# Patient Record
Sex: Male | Born: 1955
Health system: Southern US, Community
[De-identification: ages and names within clinical notes are randomized; demographics above are authoritative.]

## PROBLEM LIST (undated history)

## (undated) DIAGNOSIS — F988 Other specified behavioral and emotional disorders with onset usually occurring in childhood and adolescence: Secondary | ICD-10-CM

## (undated) DIAGNOSIS — H409 Unspecified glaucoma: Secondary | ICD-10-CM

## (undated) DIAGNOSIS — IMO0002 Reserved for concepts with insufficient information to code with codable children: Secondary | ICD-10-CM

## (undated) DIAGNOSIS — U071 COVID-19: Secondary | ICD-10-CM

## (undated) DIAGNOSIS — H33019 Retinal detachment with single break, unspecified eye: Secondary | ICD-10-CM

## (undated) DIAGNOSIS — L821 Other seborrheic keratosis: Secondary | ICD-10-CM

## (undated) DIAGNOSIS — M1990 Unspecified osteoarthritis, unspecified site: Secondary | ICD-10-CM

## (undated) DIAGNOSIS — R51 Headache: Secondary | ICD-10-CM

## (undated) DIAGNOSIS — G4700 Insomnia, unspecified: Secondary | ICD-10-CM

## (undated) DIAGNOSIS — F332 Major depressive disorder, recurrent severe without psychotic features: Secondary | ICD-10-CM

## (undated) DIAGNOSIS — Z0000 Encounter for general adult medical examination without abnormal findings: Secondary | ICD-10-CM

## (undated) DIAGNOSIS — I10 Essential (primary) hypertension: Secondary | ICD-10-CM

## (undated) DIAGNOSIS — Z125 Encounter for screening for malignant neoplasm of prostate: Secondary | ICD-10-CM

## (undated) DIAGNOSIS — F419 Anxiety disorder, unspecified: Secondary | ICD-10-CM

## (undated) DIAGNOSIS — M25559 Pain in unspecified hip: Secondary | ICD-10-CM

## (undated) DIAGNOSIS — L570 Actinic keratosis: Secondary | ICD-10-CM

## (undated) DIAGNOSIS — Z79899 Other long term (current) drug therapy: Secondary | ICD-10-CM

## (undated) DIAGNOSIS — K4090 Unilateral inguinal hernia, without obstruction or gangrene, not specified as recurrent: Secondary | ICD-10-CM

## (undated) DIAGNOSIS — E785 Hyperlipidemia, unspecified: Secondary | ICD-10-CM

## (undated) DIAGNOSIS — K589 Irritable bowel syndrome without diarrhea: Secondary | ICD-10-CM

## (undated) DIAGNOSIS — I4891 Unspecified atrial fibrillation: Secondary | ICD-10-CM

## (undated) DIAGNOSIS — E7800 Pure hypercholesterolemia, unspecified: Secondary | ICD-10-CM

## (undated) DIAGNOSIS — R609 Edema, unspecified: Secondary | ICD-10-CM

## (undated) DIAGNOSIS — G44209 Tension-type headache, unspecified, not intractable: Secondary | ICD-10-CM

## (undated) DIAGNOSIS — K648 Other hemorrhoids: Secondary | ICD-10-CM

## (undated) DIAGNOSIS — E669 Obesity, unspecified: Secondary | ICD-10-CM

## (undated) DIAGNOSIS — L2089 Other atopic dermatitis: Secondary | ICD-10-CM

## (undated) DIAGNOSIS — H4389 Other disorders of vitreous body: Secondary | ICD-10-CM

## (undated) DIAGNOSIS — L309 Dermatitis, unspecified: Secondary | ICD-10-CM

## (undated) DIAGNOSIS — R1032 Left lower quadrant pain: Secondary | ICD-10-CM

## (undated) DIAGNOSIS — H538 Other visual disturbances: Secondary | ICD-10-CM

## (undated) DIAGNOSIS — F1010 Alcohol abuse, uncomplicated: Secondary | ICD-10-CM

## (undated) DIAGNOSIS — M545 Low back pain, unspecified: Secondary | ICD-10-CM

## (undated) DIAGNOSIS — M8448XA Pathological fracture, other site, initial encounter for fracture: Secondary | ICD-10-CM

## (undated) DIAGNOSIS — F3289 Other specified depressive episodes: Secondary | ICD-10-CM

## (undated) DIAGNOSIS — F411 Generalized anxiety disorder: Secondary | ICD-10-CM

## (undated) DIAGNOSIS — F329 Major depressive disorder, single episode, unspecified: Secondary | ICD-10-CM

## (undated) DIAGNOSIS — M5430 Sciatica, unspecified side: Secondary | ICD-10-CM

## (undated) DIAGNOSIS — E119 Type 2 diabetes mellitus without complications: Secondary | ICD-10-CM

## (undated) DIAGNOSIS — J329 Chronic sinusitis, unspecified: Secondary | ICD-10-CM

## (undated) DIAGNOSIS — M2550 Pain in unspecified joint: Secondary | ICD-10-CM

## (undated) DIAGNOSIS — M542 Cervicalgia: Secondary | ICD-10-CM

## (undated) HISTORY — DX: Obesity, unspecified: E66.9

## (undated) HISTORY — DX: Type 2 diabetes mellitus without complications: E11.9

## (undated) HISTORY — DX: Sciatica, unspecified side: M54.30

## (undated) HISTORY — DX: Cervicalgia: M54.2

## (undated) HISTORY — DX: Major depressive disorder, recurrent severe without psychotic features: F33.2

## (undated) HISTORY — DX: Insomnia, unspecified: G47.00

## (undated) HISTORY — DX: Other long term (current) drug therapy: Z79.899

## (undated) HISTORY — DX: Unspecified glaucoma: H40.9

## (undated) HISTORY — DX: Other specified depressive episodes: F32.89

## (undated) HISTORY — DX: Actinic keratosis: L57.0

## (undated) HISTORY — DX: Irritable bowel syndrome, unspecified: K58.9

## (undated) HISTORY — DX: Generalized anxiety disorder: F41.1

## (undated) HISTORY — DX: Low back pain: M54.5

## (undated) HISTORY — DX: Hyperlipidemia, unspecified: E78.5

## (undated) HISTORY — DX: Pathological fracture, other site, initial encounter for fracture: M84.48XA

## (undated) HISTORY — DX: Pain in unspecified hip: M25.559

## (undated) HISTORY — DX: Encounter for general adult medical examination without abnormal findings: Z00.00

## (undated) HISTORY — DX: Unspecified atrial fibrillation: I48.91

## (undated) HISTORY — DX: Unilateral inguinal hernia, without obstruction or gangrene, not specified as recurrent: K40.90

## (undated) HISTORY — DX: Alcohol abuse, uncomplicated: F10.10

## (undated) HISTORY — DX: Major depressive disorder, single episode, unspecified: F32.9

## (undated) HISTORY — DX: Essential (primary) hypertension: I10

## (undated) HISTORY — DX: Edema, unspecified: R60.9

## (undated) HISTORY — DX: Tension-type headache, unspecified, not intractable: G44.209

## (undated) HISTORY — DX: Other atopic dermatitis: L20.89

## (undated) HISTORY — DX: Other hemorrhoids: K64.8

## (undated) HISTORY — DX: Low back pain, unspecified: M54.50

## (undated) HISTORY — DX: Other seborrheic keratosis: L82.1

## (undated) HISTORY — DX: Retinal detachment with single break, unspecified eye: H33.019

## (undated) HISTORY — DX: Encounter for screening for malignant neoplasm of prostate: Z12.5

## (undated) HISTORY — DX: Left lower quadrant pain: R10.32

## (undated) HISTORY — DX: Pain in unspecified joint: M25.50

## (undated) HISTORY — DX: Other visual disturbances: H53.8

## (undated) HISTORY — DX: Other specified behavioral and emotional disorders with onset usually occurring in childhood and adolescence: F98.8

## (undated) HISTORY — DX: Other disorders of vitreous body: H43.89

## (undated) HISTORY — DX: COVID-19: U07.1

## (undated) HISTORY — DX: Reserved for concepts with insufficient information to code with codable children: IMO0002

---
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
## 1985-12-25 HISTORY — PX: APPENDECTOMY: SHX54

## 2002-01-20 ENCOUNTER — Encounter (INDEPENDENT_AMBULATORY_CARE_PROVIDER_SITE_OTHER): Payer: Self-pay | Admitting: *Deleted

## 2002-01-20 ENCOUNTER — Ambulatory Visit (HOSPITAL_COMMUNITY): Admission: RE | Admit: 2002-01-20 | Discharge: 2002-01-20 | Payer: Self-pay | Admitting: *Deleted

## 2008-07-22 ENCOUNTER — Ambulatory Visit (HOSPITAL_COMMUNITY): Admission: RE | Admit: 2008-07-22 | Discharge: 2008-07-23 | Payer: Self-pay | Admitting: Ophthalmology

## 2008-07-22 HISTORY — PX: RETINAL DETACHMENT REPAIR W/ SCLERAL BUCKLE LE: SHX2338

## 2009-07-08 ENCOUNTER — Encounter: Admission: RE | Admit: 2009-07-08 | Discharge: 2009-07-08 | Payer: Self-pay | Admitting: Internal Medicine

## 2009-07-08 ENCOUNTER — Encounter (INDEPENDENT_AMBULATORY_CARE_PROVIDER_SITE_OTHER): Payer: Self-pay | Admitting: *Deleted

## 2009-12-25 HISTORY — PX: CATARACT EXTRACTION: SUR2

## 2010-04-06 ENCOUNTER — Encounter (INDEPENDENT_AMBULATORY_CARE_PROVIDER_SITE_OTHER): Payer: Self-pay | Admitting: *Deleted

## 2010-05-24 ENCOUNTER — Ambulatory Visit: Payer: Self-pay | Admitting: Gastroenterology

## 2010-05-24 ENCOUNTER — Encounter (INDEPENDENT_AMBULATORY_CARE_PROVIDER_SITE_OTHER): Payer: Self-pay | Admitting: *Deleted

## 2010-05-24 DIAGNOSIS — K5732 Diverticulitis of large intestine without perforation or abscess without bleeding: Secondary | ICD-10-CM | POA: Insufficient documentation

## 2010-05-24 DIAGNOSIS — K589 Irritable bowel syndrome without diarrhea: Secondary | ICD-10-CM | POA: Insufficient documentation

## 2010-06-17 ENCOUNTER — Encounter (INDEPENDENT_AMBULATORY_CARE_PROVIDER_SITE_OTHER): Payer: Self-pay

## 2010-06-21 ENCOUNTER — Ambulatory Visit: Payer: Self-pay | Admitting: Gastroenterology

## 2010-06-21 ENCOUNTER — Telehealth: Payer: Self-pay | Admitting: Gastroenterology

## 2010-10-21 ENCOUNTER — Encounter: Admission: RE | Admit: 2010-10-21 | Discharge: 2010-10-21 | Payer: Self-pay | Admitting: Otolaryngology

## 2010-12-25 HISTORY — PX: NASAL SINUS SURGERY: SHX719

## 2011-01-05 HISTORY — PX: NASAL POLYP SURGERY: SHX186

## 2011-01-26 NOTE — Miscellaneous (Signed)
Summary: Lec previsit  Clinical Lists Changes  Medications: Added new medication of MOVIPREP 100 GM  SOLR (PEG-KCL-NACL-NASULF-NA ASC-C) As per prep instructions. - Signed Rx of MOVIPREP 100 GM  SOLR (PEG-KCL-NACL-NASULF-NA ASC-C) As per prep instructions.;  #1 x 0;  Signed;  Entered by: Ulis Rias RN;  Authorized by: Meryl Dare MD [facility];  Method used: Electronically to [facility] [phone] [address], [place], [place], [place]  [address], Ph: [phone] or [phone], Fax: [phone]    Prescriptions: MOVIPREP 100 GM  SOLR (PEG-KCL-NACL-NASULF-NA ASC-C) As per prep instructions.  #1 x 0   Entered by:   Ulis Rias RN   Authorized by:   Meryl Dare MD [facility]   Signed by:   Ulis Rias RN on 06/21/2010   Method used:   Electronically to        [facility] (retail)       [address]       [place], [place]  [address]       Ph: [phone] or [phone]       Fax: [phone]   RxID:   [id number]

## 2011-01-26 NOTE — Procedures (Signed)
Summary: Colonoscopyand biopsy   Colonoscopy  Procedure date:  01/20/2002  Findings:      Results: Diverticulosis.       Location:  [facility].    Procedures Next Due Date:    Colonoscopy: 01/2012 Patient Name: [name], [name]:  Procedure Procedures: Colonoscopy CPT: 95621.    with biopsy. CPT: Q5068410.  Personnel: Endoscopist: Roosvelt Harps, MD.  Referred By: Lenon Curt. Cassell Clement, MD.  Exam Location: Exam performed in Endoscopy Suite. Outpatient  Patient Consent: Procedure, Alternatives, Risks and Benefits discussed, consent obtained, from patient. Consent was obtained by the RN. Consent to be contacted was not given.  Indications Symptoms: Diarrhea Hematochezia. Abdominal pain / bloating.  History Allergies: No known allergies.  Patient Habits Patient does not smoke. Drinking Status: currently drinking. 1 drinks per day.  Pre-Exam Physical: Cardio-pulmonary exam, Rectal exam, HEENT exam , Abdominal exam, Extremity exam, Neurological exam, Mental status exam WNL.  Exam Exam: Extent of exam reached: Ileum, extent intended: Ileum.  The cecum was identified by appendiceal orifice and IC valve. Patient position: on left side. Colon retroflexion performed. Images taken. ASA Classification: I. Tolerance: excellent.  Monitoring: Pulse and BP monitoring, Oximetry used. Supplemental O2 given.  Colon Prep Used Phospho Soda for colon prep. Prep results: excellent.  Fluoroscopy: Fluoroscopy was not used.  Sedation Meds: Patient assessed and found to be appropriate for moderate (conscious) sedation. Sedation was managed by the Endoscopist. Demerol 120 given IV. Versed 12 given IV.  Findings IMAGE TAKEN: Ascending Colon.  Image #3 attached.  Comments:  Normal.  IMAGE TAKEN: Ileum.  Image #2 attached.  Comments:  Normal.  IMAGE TAKEN: Cecum.  Image #1 attached.  Comments:  Normal.  - DIVERTICULOSIS: Descending Colon to Sigmoid Colon. Not bleeding. ICD9:  Diverticulosis, Colon: 562.10. Comments: Moderate.  IMAGE TAKEN: Rectum.  Image #4 attached.  Comments:  Normal.   Assessment Abnormal examination, see findings above.  Diagnoses: 562.10: Diverticulosis, Colon.   Events  Unplanned Interventions: No intervention was required.  Unplanned Events: There were no complications. Plans  Post Exam Instructions: Post sedation instructions given.  Medication Plan: Continue current medications.  Patient Education: Patient given standard instructions for: Diverticulosis. Patient instructed to get routine colonoscopy every 10 years.  Disposition: After procedure patient sent to recovery. After recovery patient sent home.  Scheduling/Referral: Follow-Up prn. Await pathology to schedule patient.         SP Surgical Pathology - STATUS: Final             By: Gwenlyn Saran  ,        Perform Date: 27Jan03 12:00  Ordered By: Luther Parody MD , PETER          Ordered Date:  Facility: [facility]                              Department: CPATH  Service Report Text  [facility]   [address]   [place], [place] [address]   [phone]    REPORT OF SURGICAL PATHOLOGY    Case #: [id number]   Patient Name: Connor [name]   PID: [id number]   Pathologist: Berneta Levins, MD   DOB/Age Feb 23, 1956 (Age: 56) Gender: M   Date Taken: 01/20/2002   Date Received: 01/20/2002    FINAL DIAGNOSIS    ***MICROSCOPIC EXAMINATION AND DIAGNOSIS***    COLON, BIOPSY: BENIGN COLONIC MUCOSA. NO SIGNIFICANT  INFLAMMATION OR OTHER ABNORMALITIES IDENTIFIED.    COMMENT   There is colonic mucosa with normal crypt architecture and no   objective increase in inflammation. No active inflammation,   microscopic colitis, collagenous colitis or significant chronic   changes identified. No hyperplastic or adenomatous changes are   seen, and there is no evidence of malignancy.    jn   Date Reported: 01/21/2002 Berneta Levins, MD   ***  Electronically Signed Out By JAS ***    specimen(s) obtained   Colon, biopsy, random    Gross Description   Received in formalin are tan, soft tissue fragments that are   submitted in toto. Number: 4   Size: 0.2 to 0.3 cm. One cassette. (JBM:caf 01/20/02)    cf/

## 2011-01-26 NOTE — Letter (Signed)
Summary: [facility] Instructions  [facility]  [address] [place], [place] [address]   Phone: [phone]  Fax: [phone]       Connor [name]    1956/11/23    MRN: [id number]        Procedure Day /Date: Tuesday 07-05-10     Arrival Time: 9:30 a.m.     Procedure Time: 10:30 a.m.     Location of Procedure:                    _x _   Endoscopy Center (4th Floor)   PREPARATION FOR COLONOSCOPY WITH MOVIPREP   Starting 5 days prior to your procedure  06-30-10 do not eat nuts, seeds, popcorn, corn, beans, peas,  salads, or any raw vegetables.  Do not take any fiber supplements (e.g. Metamucil, Citrucel, and Benefiber).  THE DAY BEFORE YOUR PROCEDURE         DATE:  07-04-10   DAY: Monday  1.  Drink clear liquids the entire day-NO SOLID FOOD  2.  Do not drink anything colored red or purple.  Avoid juices with pulp.  No orange juice.  3.  Drink at least 64 oz. (8 glasses) of fluid/clear liquids during the day to prevent dehydration and help the prep work efficiently.  CLEAR LIQUIDS INCLUDE: Water Jello Ice Popsicles Tea (sugar ok, no milk/cream) Powdered fruit flavored drinks Coffee (sugar ok, no milk/cream) Gatorade Juice: apple, white grape, white cranberry  Lemonade Clear bullion, consomm, broth Carbonated beverages (any kind) Strained chicken noodle soup Hard Candy                             4.  In the morning, mix first dose of MoviPrep solution:    Empty 1 Pouch A and 1 Pouch B into the disposable container    Add lukewarm drinking water to the top line of the container. Mix to dissolve    Refrigerate (mixed solution should be used within 24 hrs)  5.  Begin drinking the prep at 5:00 p.m. The MoviPrep container is divided by 4 marks.   Every 15 minutes drink the solution down to the next mark (approximately 8 oz) until the full liter is complete.   6.  Follow completed prep with 16 oz of clear liquid of your choice (Nothing red or purple).   Continue to drink clear liquids until bedtime.  7.  Before going to bed, mix second dose of MoviPrep solution:    Empty 1 Pouch A and 1 Pouch B into the disposable container    Add lukewarm drinking water to the top line of the container. Mix to dissolve    Refrigerate  THE DAY OF YOUR PROCEDURE      DATE:  07-05-10   DAY: Tuesday  Beginning at  5:30 a.m. (5 hours before procedure):         1. Every 15 minutes, drink the solution down to the next mark (approx 8 oz) until the full liter is complete.  2. Follow completed prep with 16 oz. of clear liquid of your choice.    3. You may drink clear liquids until  8:30 a.m. (2 HOURS BEFORE PROCEDURE).   MEDICATION INSTRUCTIONS  Unless otherwise instructed, you should take regular prescription medications with a small sip of water   as early as possible the morning of your procedure.         OTHER INSTRUCTIONS  You will need a responsible adult at least 54 years of age to accompany you and drive you home.   This person must remain in the waiting room during your procedure.  Wear loose fitting clothing that is easily removed.  Leave jewelry and other valuables at home.  However, you may wish to bring a book to read or  an iPod/MP3 player to listen to music as you wait for your procedure to start.  Remove all body piercing jewelry and leave at home.  Total time from sign-in until discharge is approximately 2-3 hours.  You should go home directly after your procedure and rest.  You can resume normal activities the  day after your procedure.  The day of your procedure you should not:   Drive   Make legal decisions   Operate machinery   Drink alcohol   Return to work  You will receive specific instructions about eating, activities and medications before you leave.    The above instructions have been reviewed and explained to me by   Ulis Rias RN  June 21, 2010 8:20 AM     I fully understand and can verbalize  these instructions _____________________________ Date _________

## 2011-01-26 NOTE — Letter (Signed)
Summary: New Patient letter  [facility]  [address] [place], [place] [address]   Phone: [phone]  Fax: [phone]       04/06/2010 MRN: [id number]  Connor [name] [address] [place], [place]  [address]  Dear Mr. MOROZOV,  Welcome to the Gastroenterology Division at [facility].    You are scheduled to see Dr.  Claudette Head on May 24, 2010 at 9:30am on the 3rd floor at [facility], [address]  We ask that you try to arrive at our office 15 minutes prior to your appointment time to allow for check-in.  We would like you to complete the enclosed self-administered evaluation form prior to your visit and bring it with you on the day of your appointment.  We will review it with you.  Also, please bring a complete list of all your medications or, if you prefer, bring the medication bottles and we will list them.  Please bring your insurance card so that we may make a copy of it.  If your insurance requires a referral to see a specialist, please bring your referral form from your primary care physician.  Co-payments are due at the time of your visit and may be paid by cash, check or credit card.     Your office visit will consist of a consult with your physician (includes a physical exam), any laboratory testing he/she may order, scheduling of any necessary diagnostic testing (e.g. x-ray, ultrasound, CT-scan), and scheduling of a procedure (e.g. Endoscopy, Colonoscopy) if required.  Please allow enough time on your schedule to allow for any/all of these possibilities.    If you cannot keep your appointment, please call [phone] to cancel or reschedule prior to your appointment date.  This allows [place] the opportunity to schedule an appointment for another patient in need of care.  If you do not cancel or reschedule by 5 p.m. the business day prior to your appointment date, you will be charged a $50.00 late cancellation/no-show fee.    Thank you for  choosing [facility] for your medical needs.  We appreciate the opportunity to care for you.  Please visit [place] at our website  to learn more about our practice.                     Sincerely,                                                             The Gastroenterology Division

## 2011-01-26 NOTE — Progress Notes (Signed)
Summary: Questions about procedure/does not want hemorrhoidal injections  Phone Note Call from Patient Call back at [phone]   Caller: Patient Call For: Dr. Russella Dar Reason for Call: Talk to Nurse Summary of Call: pt. has some questions about his procedure Initial call taken by: Karna Christmas,  June 21, 2010 12:30 PM  Follow-up for Phone Call        Pt. has decided he does not want hemorrhoidal injections at the time of his colonoscopy and wants to reverse his initials on his consent form.  Advised pt. to change that when he comes in for his colon.  Pt. verbalized understanding. Follow-up by: Wyona Almas RN,  June 21, 2010 12:37 PM

## 2011-01-26 NOTE — Letter (Signed)
Summary: Previsit letter  [facility]  [address] [place], [place] [address]   Phone: [phone]  Fax: [phone]       05/24/2010 MRN: [id number]  Connor [name] [address] [place], [place]  [address]  Dear Connor [name],  Welcome to the Gastroenterology Division at [facility].    You are scheduled to see a nurse for your pre-procedure visit on 06-21-10 at 8:00a.m. on the 3rd floor at [facility], [address]  We ask that you try to arrive at our office 15 minutes prior to your appointment time to allow for check-in.  Your nurse visit will consist of discussing your medical and surgical history, your immediate family medical history, and your medications.    Please bring a complete list of all your medications or, if you prefer, bring the medication bottles and we will list them.  We will need to be aware of both prescribed and over the counter drugs.  We will need to know exact dosage information as well.  If you are on blood thinners (Coumadin, Plavix, Aggrenox, Ticlid, etc.) please call our office today/prior to your appointment, as we need to consult with your physician about holding your medication.   Please be prepared to read and sign documents such as consent forms, a financial agreement, and acknowledgement forms.  If necessary, and with your consent, a friend or relative is welcome to sit-in on the nurse visit with you.  Please bring your insurance card so that we may make a copy of it.  If your insurance requires a referral to see a specialist, please bring your referral form from your primary care physician.  No co-pay is required for this nurse visit.     If you cannot keep your appointment, please call [phone] to cancel or reschedule prior to your appointment date.  This allows [place] the opportunity to schedule an appointment for another patient in need of care.    Thank you for choosing [facility] for your medical needs.   We appreciate the opportunity to care for you.  Please visit [place] at our website  to learn more about our practice.                     Sincerely.                                                                                                                   The Gastroenterology Division

## 2011-01-26 NOTE — Assessment & Plan Note (Signed)
Summary: diverticulities....em   History of Present Illness Visit Type: consult Primary GI MD: Elie Goody MD [facility] Primary Provider: Murray Hodgkins, MD Chief Complaint: Flare-up Diverticulitis in April.  Saw Dr. Chilton Si and was placed on Flagyl and Cipro.  Still c/o some LUQ discomfort and hemorrhoids. History of Present Illness:   This is a 55 year old white male, who relates a long history of recurrent episodes of diverticulitis. He states he was first diagnosed in 1987with right-sided diverticulitis at the time of an appendectomy. He states he is having about one episode per year for the past 10 years or so, but over the past 12 months, he has had 4 separate episodes. An abdominal and pelvic CT scan in July 2010  showed descending colon diverticulitis. He previously underwent colonoscopy in 2003 showing sigmoid, and descending colon diverticulosis.  He has episodic irritable bowel syndrome with crampy lower abdominal pain and urgent and loose stools. These symptoms occur about twice a week and respond well to dicyclomine.   GI Review of Systems    Reports bloating.      Denies abdominal pain, acid reflux, belching, chest pain, dysphagia with liquids, dysphagia with solids, heartburn, loss of appetite, nausea, vomiting, vomiting blood, weight loss, and  weight gain.      Reports diverticulosis, hemorrhoids, irritable bowel syndrome, and  rectal pain.     Denies anal fissure, black tarry stools, change in bowel habit, constipation, diarrhea, fecal incontinence, heme positive stool, jaundice, light color stool, liver problems, and  rectal bleeding.   Current Medications (verified): 1)  Lovastatin 20 Mg Tabs (Lovastatin) .Marland Kitchen.. 1 By Mouth Once Daily 2)  Adderall Xr 10 Mg Xr24h-Cap (Amphetamine-Dextroamphetamine) .Marland Kitchen.. 1 By Mouth Once Daily 3)  Dicyclomine Hcl 20 Mg Tabs (Dicyclomine Hcl) .... Take As Needed 4)  Klonopin 0.5 Mg Tabs (Clonazepam) .... Take By Mouth As Needed  Allergies  (verified): 1)  ! Darvon 2)  ! Darvocet 3)  ! * Ambien  Past History:  Past Medical History: Anxiety Disorder Depression Diverticulitis Diverticulosis Hyperlipidemia Hypertension Irritable Bowel Syndrome  Past Surgical History: Appendectomy 1987 Cataract OD Detached Retina Repair OD  Family History: Reviewed history and no changes required. Family History of Prostate Cancer:brother No FH of Colon Cancer: Family History of Diabetes: Uncles, Aunts, Cousins Family History of Heart Disease: Uncles, Aunts, Cousins  Social History: Reviewed history and no changes required. 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  Alcohol Use - yes Daily Caffeine Use Illicit Drug Use - no Smoking Status:  quit Drug Use:  no  Review of Systems       The patient complains of anxiety-new and sleeping problems.         The pertinent positives and negatives are noted as above and in the HPI. All other ROS were reviewed and were negative.   Vital Signs:  Patient profile:   55 year old male Height:      70 inches Weight:      174.38 pounds BMI:     25.11 Pulse rate:   68 / minute Pulse rhythm:   regular BP sitting:   138 / 90  (left arm)  Vitals Entered By: Milford Cage NCMA (May 24, 2010 9:26 AM)  Physical Exam  General:  Well developed, well nourished, no acute distress. Head:  Normocephalic and atraumatic. Eyes:  PERRLA, no icterus. Ears:  Normal auditory acuity. Mouth:  No deformity or lesions, dentition normal. Neck:  Supple; no masses or thyromegaly. Lungs:  Clear throughout to auscultation. Heart:  Regular rate and rhythm; no murmurs, rubs,  or bruits. Abdomen:  Soft, nontender and nondistended. No masses, hepatosplenomegaly or hernias noted. Normal bowel sounds. Rectal:  deferred until time of colonoscopy.   Msk:  Symmetrical with no gross deformities. Normal posture. Pulses:  Normal pulses noted. Extremities:  No clubbing, cyanosis, edema  or deformities noted. Neurologic:  Alert and  oriented x4;  grossly normal neurologically. Cervical Nodes:  No significant cervical adenopathy. Inguinal Nodes:  No significant inguinal adenopathy. Psych:  Alert and cooperative. Normal mood and affect.  Impression & Recommendations:  Problem # 1:  DIVERTICULITIS-COLON (ICD-562.11) Multiple, recurrent episodes of diverticulitis. He is advised to remain on long-term high fiber diet with daily fiber supplements, at least 6 glasses of water daily. Consider minimizing, seeds, and nuts. Rule out inflammatory bowel disease, and colorectal neoplasms. The risks, benefits and alternatives to colonoscopy with possible biopsy and possible polypectomy were discussed with the patient and they consent to proceed. The procedure will be scheduled electively. Consider segmental colectomy if he has persistent or more severe episodes.  Problem # 2:  IRRITABLE BOWEL SYNDROME (ICD-564.1) Aa problem #1. Dicyclomine 20 mg t.i.d. p.r.n. Align q. day  Patient Instructions: 1)  Please call us back to schedule colonoscopy. 2)  Colonoscopy brochure given.  3)  Copy sent to : Murray Hodgkins, MD 4)  The medication list was reviewed and reconciled.  All changed / newly prescribed medications were explained.  A complete medication list was provided to the patient / caregiver.

## 2011-04-28 ENCOUNTER — Ambulatory Visit
Admission: RE | Admit: 2011-04-28 | Discharge: 2011-04-28 | Disposition: A | Discharge status: Home / Self Care | Payer: BC Managed Care – PPO | Source: Ambulatory Visit | Attending: General Surgery | Admitting: General Surgery

## 2011-04-28 ENCOUNTER — Encounter (HOSPITAL_BASED_OUTPATIENT_CLINIC_OR_DEPARTMENT_OTHER)
Admission: RE | Admit: 2011-04-28 | Discharge: 2011-04-28 | Disposition: A | Discharge status: Home / Self Care | Payer: BC Managed Care – PPO | Source: Ambulatory Visit | Attending: General Surgery | Admitting: General Surgery

## 2011-04-28 ENCOUNTER — Other Ambulatory Visit: Payer: Self-pay | Admitting: General Surgery

## 2011-04-28 DIAGNOSIS — Z01811 Encounter for preprocedural respiratory examination: Secondary | ICD-10-CM

## 2011-05-03 ENCOUNTER — Ambulatory Visit (HOSPITAL_BASED_OUTPATIENT_CLINIC_OR_DEPARTMENT_OTHER)
Admission: RE | Admit: 2011-05-03 | Discharge: 2011-05-03 | Disposition: A | Discharge status: Home / Self Care | Payer: BC Managed Care – PPO | Source: Ambulatory Visit | Attending: General Surgery | Admitting: General Surgery

## 2011-05-03 DIAGNOSIS — F341 Dysthymic disorder: Secondary | ICD-10-CM | POA: Insufficient documentation

## 2011-05-03 DIAGNOSIS — Z01810 Encounter for preprocedural cardiovascular examination: Secondary | ICD-10-CM | POA: Insufficient documentation

## 2011-05-03 DIAGNOSIS — K4090 Unilateral inguinal hernia, without obstruction or gangrene, not specified as recurrent: Secondary | ICD-10-CM | POA: Insufficient documentation

## 2011-05-03 HISTORY — PX: HERNIA REPAIR: SHX51

## 2011-05-09 NOTE — Op Note (Signed)
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   OPERATION/PROCEDURE:  Pars plana vitrectomy and repair of the retinal  detachment with buckle of the right eye.   COMPLICATIONS:  None.   DESCRIPTION OF PROCEDURE:  Mr. Mcparland was identified in the preoperative  holding area and then taken to the operating room where he was placed  under general anesthesia.  The right eye was then prepped and draped in  the usual sterile fashion prior to the surgery including trimming of  lashes.  A Lieberman speculum was placed between the right upper and  lower eyelids for exposure.  A 360-degree conjunctival peritomy was then  performed with 0.3 forceps and Westcott scissors throughout the upper  scleral buckle.  The 4 recti muscles were isolated and looped using 2-0  silk sutures.  Stevens tenotomy scissors were used to dissect the  Tenon's capsule off the sclera in all 4 quadrants.  The eye was then  inspected with indirect ophthalmoscopy.  A large retinal break was  identified in the temporal periphery at approximately 9 o'clock.  This  was surrounded by confluent cryotherapy.  No other retinal breaks were  identified.  Next, 25-gauge cannulas were introduced via the pars plana  into the vitreous cavity using a 25-gauge trocar system.  These were  placed in the inferior temporal, superior temporal, and superior nasal  quadrants.  After these were placed and infusion cannula was placed in  the inferior temporal sclerotomy  location, the correct placement of the  infusion cannula was confirmed prior to its use.  Next, a core  vitrectomy was performed with a 25-guage cutter and light pipe.  The  posterior vitreous detachment had been created previously, and this was  elevated and posterior hyaloid removed.  All tractions were taken off  the retinal break including the anterior flap.  Fluid was then drained  from the retinal break.  Next, a gas-fluid exchange was performed using  the soft-tipped extrusion cannula.  Residual posterior subretinal fluid  was noted.  Thus, a retinotomy was performed using endocautery.  Following this, a soft-tipped extrusion cannula was used to removed  residual subretinal fluid through the retinotomy.  Additional endolaser  photocoagulation was then placed around the retinotomy ensuring that all  retinal breaks were treated.  Indirect ophthalmoscopy was then used to  look around the peripheral retina.  At this time, it was noted that  there was excellent support of all retinal breaks.  The eye was then  flushed with a 14% concentration of C3F8 intraocular gas.  The cannulas  were then removed from  the sclerotomy without difficulty.  A 7-0 Vicryl  sutures were used to suture the sclerotomies closed.  At that point in  time, the 4 recti muscles were isolated and removed.  At that point in  time, the eye was encircled with a #240 scleral buckle joined with a  #270 sleeve.  A 220 element was placed over the retinal break.  This was  sutured in place with 5-0 Mersilene sutures in the oblique quadrants to  support the scleral buckle.  This was inspected with indirect  ophthalmoscopy.  There is excellent support of the retinal break without  additional problems noted.  Next, the Tenon capsule and conjunctiva were  reapproximated to the limbus using 7-0 Vicryl suture.  The eye was then  treated with subconjunctival injections of 25 mg of Ancef and 5 mg of  dexamethasone.  The eye was then  treated topically with 1 drop of 1%  atropine with TobraDex ointment.  The speculum was removed.  Eyelids  were cleaned, closed, and then packed and shielded.  The patient then  awoke from general anesthesia, was taken to recovery in excellent  condition having tolerated the procedure well.      Donnel Saxon, MD  Electronically Signed     JBS/MEDQ  D:  07/27/2008  T:  07/28/2008  Job:  [id number]

## 2011-09-22 LAB — CBC
MCHC: 34.6
MCV: 92
RDW: 13.4

## 2011-10-24 ENCOUNTER — Ambulatory Visit (INDEPENDENT_AMBULATORY_CARE_PROVIDER_SITE_OTHER): Payer: BC Managed Care – PPO | Admitting: General Surgery

## 2011-10-24 ENCOUNTER — Encounter (INDEPENDENT_AMBULATORY_CARE_PROVIDER_SITE_OTHER): Payer: Self-pay | Admitting: General Surgery

## 2011-10-24 VITALS — BP 146/88 | HR 80 | Temp 97.8°F | Resp 16 | Ht 70.0 in | Wt 169.8 lb

## 2011-10-24 DIAGNOSIS — K4090 Unilateral inguinal hernia, without obstruction or gangrene, not specified as recurrent: Secondary | ICD-10-CM | POA: Insufficient documentation

## 2011-10-24 NOTE — Patient Instructions (Signed)
Hernia °A hernia occurs when an internal organ pushes out through a weak spot in the abdominal wall. Hernias most commonly occur in the groin and around the navel. Hernias often can be pushed back into place (reduced). Most hernias tend to get worse over time. Some abdominal hernias can get stuck in the opening (irreducible or incarcerated hernia) and cannot be reduced. An irreducible abdominal hernia which is tightly squeezed into the opening is at risk for impaired blood supply (strangulated hernia). A strangulated hernia is a medical emergency. Because of the risk for an irreducible or strangulated hernia, surgery may be recommended to repair a hernia. °CAUSES  °· Heavy lifting.  °· Prolonged coughing.  °· Straining to have a bowel movement.  °· A cut (incision) made during an abdominal surgery.  °HOME CARE INSTRUCTIONS  °· Bed rest is not required. You may continue your normal activities.  °· Avoid lifting more than 10 pounds (4.5 kg) or straining.  °· Cough gently. If you are a smoker it is best to stop. Even the best hernia repair can break down with the continual strain of coughing. Even if you do not have your hernia repaired, a cough will continue to aggravate the problem.  °· Do not wear anything tight over your hernia. Do not try to keep it in with an outside bandage or truss. These can damage abdominal contents if they are trapped within the hernia sac.  °· Eat a normal diet.  °· Avoid constipation. Straining over long periods of time will increase hernia size and encourage breakdown of repairs. If you cannot do this with diet alone, stool softeners may be used.  °SEEK IMMEDIATE MEDICAL CARE IF:  °· You have a fever.  °· You develop increasing abdominal pain.  °· You feel nauseous or vomit.  °· Your hernia is stuck outside the abdomen, looks discolored, feels hard, or is tender.  °· You have any changes in your bowel habits or in the hernia that are unusual for you.  °· You have increased pain or  swelling around the hernia.  °· You cannot push the hernia back in place by applying gentle pressure while lying down.  °MAKE SURE YOU:  °· Understand these instructions.  °· Will watch your condition.  °· Will get help right away if you are not doing well or get worse.  °Document Released: 12/11/2005 Document Revised: 08/23/2011 Document Reviewed: 07/30/2008 °[employer]® Patient Information ©2012 [employer]. °

## 2011-10-24 NOTE — Progress Notes (Signed)
Subjective:     Patient ID: Connor [name], male   DOB: 1956-06-02, 55 y.o.   MRN: [id number]  HPI This is a 55 year old male who I know well from a prior left lower hernia repair. He had been doing well until a few weeks ago when he began to develop some pressure, spleen, and a bulge in his right groin. This always goes away. He comes in today to discuss if he has a hernia on that side. Not really aggravated at all except by doing activities. He is relieved by lying down and just time. He has no other changes in his bowel movements. He has some occasional soreness in his left groin site as well.  Review of Systems  Constitutional: Negative for fever, chills and unexpected weight change.  HENT: Negative for hearing loss, congestion, sore throat, trouble swallowing and voice change.   Eyes: Negative for visual disturbance.  Respiratory: Negative for cough and wheezing.   Cardiovascular: Negative for chest pain, palpitations and leg swelling.  Gastrointestinal: Negative for nausea, vomiting, abdominal pain, diarrhea, constipation, blood in stool, abdominal distention, anal bleeding and rectal pain.  Genitourinary: Negative for hematuria and difficulty urinating.  Musculoskeletal: Negative for arthralgias.  Skin: Negative for rash and wound.  Neurological: Negative for seizures, syncope, weakness and headaches.  Hematological: Negative for adenopathy. Does not bruise/bleed easily.  Psychiatric/Behavioral: Negative for confusion.       Objective:   Physical Exam  Constitutional: He appears well-developed and well-nourished.  Eyes: No scleral icterus.  Cardiovascular: Normal rate, regular rhythm and normal heart sounds.   Pulmonary/Chest: Effort normal and breath sounds normal. He has no wheezes. He has no rales.  Abdominal: Soft. There is no tenderness. A hernia is present. Hernia confirmed positive in the right inguinal area. Hernia confirmed negative in the ventral area and confirmed  negative in the left inguinal area.  Genitourinary: Testes normal.          Assessment:     Right inguinal hernia    Plan:    We discussed observation versus repair.  We discussed an open inguinal hernia repair. I described the procedure in detail.  The patient was given educational material.  Goals should be achieved with surgery. We discussed the usage of mesh and the rationale behind that. We went over the pathophysiology of inguinal hernia. We have elected to perform open inguinal hernia repair with mesh.  We discussed the risks including bleeding, infection, recurrence, postoperative pain and chronic groin pain, testicular injury, urinary retention, numbness in groin and around incision.  He would like to do in January and he will call to schedule.  I told him if worse he should call and we will do sooner.

## 2011-11-25 DIAGNOSIS — J329 Chronic sinusitis, unspecified: Secondary | ICD-10-CM

## 2011-11-25 DIAGNOSIS — L309 Dermatitis, unspecified: Secondary | ICD-10-CM

## 2011-11-25 HISTORY — DX: Dermatitis, unspecified: L30.9

## 2011-11-25 HISTORY — DX: Chronic sinusitis, unspecified: J32.9

## 2011-11-29 ENCOUNTER — Telehealth (INDEPENDENT_AMBULATORY_CARE_PROVIDER_SITE_OTHER): Payer: Self-pay | Admitting: General Surgery

## 2011-11-30 NOTE — Telephone Encounter (Signed)
Hey Dr Dwain Sarna this pt needs surgical orders for Right ing. Hernia repair to be scheduled per Amy/ [facility]

## 2011-12-02 ENCOUNTER — Other Ambulatory Visit (INDEPENDENT_AMBULATORY_CARE_PROVIDER_SITE_OTHER): Payer: Self-pay | Admitting: General Surgery

## 2011-12-02 NOTE — Telephone Encounter (Signed)
Orders are in computer

## 2011-12-21 ENCOUNTER — Encounter (HOSPITAL_BASED_OUTPATIENT_CLINIC_OR_DEPARTMENT_OTHER): Payer: Self-pay | Admitting: *Deleted

## 2011-12-21 NOTE — Pre-Procedure Instructions (Signed)
To come for CBC and BMET; to take soap shower PM 12/26/2010 and AM 12/27/2010

## 2011-12-25 ENCOUNTER — Encounter (HOSPITAL_BASED_OUTPATIENT_CLINIC_OR_DEPARTMENT_OTHER)
Admission: RE | Admit: 2011-12-25 | Discharge: 2011-12-25 | Disposition: A | Discharge status: Home / Self Care | Payer: BC Managed Care – PPO | Source: Ambulatory Visit | Attending: General Surgery | Admitting: General Surgery

## 2011-12-25 LAB — BASIC METABOLIC PANEL
BUN: 13 mg/dL (ref 6–23)
Calcium: 9.1 mg/dL (ref 8.4–10.5)
GFR calc Af Amer: >90 mL/min (ref >90)
GFR calc non Af Amer: >90 mL/min (ref >90)
Glucose, Bld: 103 mg/dL — ABNORMAL HIGH (ref 70–99)
Potassium: 4.4 mEq/L (ref 3.5–5.1)
Sodium: 139 mEq/L (ref 135–145)

## 2011-12-25 LAB — CBC
Hemoglobin: 13.8 g/dL (ref 13.0–17.0)
MCH: 32.1 pg (ref 26.0–34.0)
MCHC: 34.5 g/dL (ref 30.0–36.0)
Platelets: 253 10*3/uL (ref 150–400)
RBC: 4.3 MIL/uL (ref 4.22–5.81)

## 2011-12-27 ENCOUNTER — Encounter (HOSPITAL_BASED_OUTPATIENT_CLINIC_OR_DEPARTMENT_OTHER): Payer: Self-pay | Admitting: *Deleted

## 2011-12-28 ENCOUNTER — Ambulatory Visit (HOSPITAL_BASED_OUTPATIENT_CLINIC_OR_DEPARTMENT_OTHER): Payer: BC Managed Care – PPO | Admitting: Certified Registered Nurse Anesthetist

## 2011-12-28 ENCOUNTER — Ambulatory Visit (HOSPITAL_BASED_OUTPATIENT_CLINIC_OR_DEPARTMENT_OTHER)
Admission: RE | Admit: 2011-12-28 | Discharge: 2011-12-28 | Disposition: A | Discharge status: Home / Self Care | Payer: BC Managed Care – PPO | Source: Ambulatory Visit | Attending: General Surgery | Admitting: General Surgery

## 2011-12-28 ENCOUNTER — Encounter (HOSPITAL_BASED_OUTPATIENT_CLINIC_OR_DEPARTMENT_OTHER): Payer: Self-pay | Admitting: *Deleted

## 2011-12-28 ENCOUNTER — Encounter (HOSPITAL_BASED_OUTPATIENT_CLINIC_OR_DEPARTMENT_OTHER)
Admission: RE | Disposition: A | Discharge status: Home / Self Care | Payer: Self-pay | Source: Ambulatory Visit | Attending: General Surgery

## 2011-12-28 ENCOUNTER — Encounter (INDEPENDENT_AMBULATORY_CARE_PROVIDER_SITE_OTHER): Payer: Self-pay | Admitting: General Surgery

## 2011-12-28 ENCOUNTER — Encounter (HOSPITAL_BASED_OUTPATIENT_CLINIC_OR_DEPARTMENT_OTHER): Payer: Self-pay | Admitting: Certified Registered Nurse Anesthetist

## 2011-12-28 DIAGNOSIS — K4090 Unilateral inguinal hernia, without obstruction or gangrene, not specified as recurrent: Secondary | ICD-10-CM | POA: Insufficient documentation

## 2011-12-28 DIAGNOSIS — Z01812 Encounter for preprocedural laboratory examination: Secondary | ICD-10-CM | POA: Insufficient documentation

## 2011-12-28 HISTORY — DX: Chronic sinusitis, unspecified: J32.9

## 2011-12-28 HISTORY — DX: Pure hypercholesterolemia, unspecified: E78.00

## 2011-12-28 HISTORY — PX: INGUINAL HERNIA REPAIR: SHX194

## 2011-12-28 HISTORY — DX: Irritable bowel syndrome, unspecified: K58.9

## 2011-12-28 HISTORY — DX: Dermatitis, unspecified: L30.9

## 2011-12-28 HISTORY — DX: Anxiety disorder, unspecified: F41.9

## 2011-12-28 HISTORY — DX: Unspecified osteoarthritis, unspecified site: M19.90

## 2011-12-28 HISTORY — DX: Headache: R51

## 2011-12-28 HISTORY — DX: Other specified behavioral and emotional disorders with onset usually occurring in childhood and adolescence: F98.8

## 2011-12-28 SURGERY — REPAIR, HERNIA, INGUINAL, ADULT
Anesthesia: General | Site: Abdomen | Laterality: Right | Wound class: Clean

## 2011-12-28 MED ORDER — ONDANSETRON HCL 4 MG/2ML IJ SOLN
INTRAMUSCULAR | Status: DC | PRN
  Administered 2011-12-28: 4 mg via INTRAVENOUS

## 2011-12-28 MED ORDER — BUPIVACAINE HCL (PF) 0.25 % IJ SOLN
INTRAMUSCULAR | Status: DC | PRN
  Administered 2011-12-28: 8 mL

## 2011-12-28 MED ORDER — DEXAMETHASONE SODIUM PHOSPHATE 4 MG/ML IJ SOLN
INTRAMUSCULAR | Status: DC | PRN
  Administered 2011-12-28: 10 mg via INTRAVENOUS

## 2011-12-28 MED ORDER — MORPHINE SULFATE 2 MG/ML IJ SOLN: 0.0500 mg/kg | INTRAMUSCULAR | Status: DC | PRN

## 2011-12-28 MED ORDER — LIDOCAINE HCL (CARDIAC) 20 MG/ML IV SOLN
INTRAVENOUS | Status: DC | PRN
  Administered 2011-12-28: 60 mg via INTRAVENOUS

## 2011-12-28 MED ORDER — FENTANYL CITRATE 0.05 MG/ML IJ SOLN
50.0000 ug | INTRAMUSCULAR | Status: DC | PRN
  Administered 2011-12-28: 100 ug via INTRAVENOUS

## 2011-12-28 MED ORDER — CEFAZOLIN SODIUM 1-5 GM-% IV SOLN: 1.0000 g | INTRAVENOUS | Status: DC

## 2011-12-28 MED ORDER — MIDAZOLAM HCL 2 MG/2ML IJ SOLN
0.5000 mg | INTRAMUSCULAR | Status: DC | PRN
  Administered 2011-12-28: 2 mg via INTRAVENOUS

## 2011-12-28 MED ORDER — LIDOCAINE HCL (PF) 1 % IJ SOLN
INTRAMUSCULAR | Status: DC | PRN
  Administered 2011-12-28: 2 mL

## 2011-12-28 MED ORDER — PROPOFOL 10 MG/ML IV EMUL
INTRAVENOUS | Status: DC | PRN
  Administered 2011-12-28: 200 mg via INTRAVENOUS

## 2011-12-28 MED ORDER — METOCLOPRAMIDE HCL 5 MG/ML IJ SOLN: 10.0000 mg | Freq: Once | INTRAMUSCULAR | Status: DC | PRN

## 2011-12-28 MED ORDER — FENTANYL CITRATE 0.05 MG/ML IJ SOLN
25.0000 ug | INTRAMUSCULAR | Status: DC | PRN
  Administered 2011-12-28: 25 ug via INTRAVENOUS
  Administered 2011-12-28: 50 ug via INTRAVENOUS
  Administered 2011-12-28 (×2): 25 ug via INTRAVENOUS

## 2011-12-28 MED ORDER — LACTATED RINGERS IV SOLN
INTRAVENOUS | Status: DC
  Administered 2011-12-28 (×2): via INTRAVENOUS

## 2011-12-28 MED ORDER — METOCLOPRAMIDE HCL 5 MG/ML IJ SOLN
INTRAMUSCULAR | Status: DC | PRN
  Administered 2011-12-28: 10 mg via INTRAVENOUS

## 2011-12-28 MED ORDER — OXYCODONE HCL 5 MG PO TABS
5.0000 mg | ORAL_TABLET | Freq: Once | ORAL | Status: AC | PRN
  Administered 2011-12-28: 5 mg via ORAL

## 2011-12-28 MED ORDER — ACETAMINOPHEN 10 MG/ML IV SOLN
1000.0000 mg | Freq: Once | INTRAVENOUS | Status: AC
  Administered 2011-12-28: 1000 mg via INTRAVENOUS

## 2011-12-28 MED ORDER — OXYCODONE-ACETAMINOPHEN 5-325 MG PO TABS: 1.0000 | ORAL_TABLET | Freq: Four times a day (QID) | ORAL | Status: AC | PRN

## 2011-12-28 MED ORDER — FENTANYL CITRATE 0.05 MG/ML IJ SOLN
INTRAMUSCULAR | Status: DC | PRN
  Administered 2011-12-28: 25 ug via INTRAVENOUS

## 2011-12-28 MED ORDER — BUPIVACAINE HCL (PF) 0.5 % IJ SOLN
INTRAMUSCULAR | Status: DC | PRN
  Administered 2011-12-28: 25 mL

## 2011-12-28 SURGICAL SUPPLY — 41 items
ADH SKN CLS APL DERMABOND .7 (GAUZE/BANDAGES/DRESSINGS) ×2
BLADE SURG 15 STRL LF DISP TIS (BLADE) ×1 IMPLANT
BLADE SURG 15 STRL SS (BLADE) ×2
BLADE SURG ROTATE 9660 (MISCELLANEOUS) ×2 IMPLANT
CHLORAPREP W/TINT 26ML (MISCELLANEOUS) ×2 IMPLANT
CLOTH BEACON ORANGE TIMEOUT ST (SAFETY) ×2 IMPLANT
COVER MAYO STAND STRL (DRAPES) ×2 IMPLANT
COVER TABLE BACK 60X90 (DRAPES) ×2 IMPLANT
DECANTER SPIKE VIAL GLASS SM (MISCELLANEOUS) IMPLANT
DERMABOND ADVANCED (GAUZE/BANDAGES/DRESSINGS) ×2
DERMABOND ADVANCED .7 DNX12 (GAUZE/BANDAGES/DRESSINGS) ×2 IMPLANT
DRAIN PENROSE 1/2X12 LTX STRL (WOUND CARE) ×2 IMPLANT
DRAPE PED LAPAROTOMY (DRAPES) ×2 IMPLANT
ELECT COATED BLADE 2.86 ST (ELECTRODE) ×2 IMPLANT
ELECT REM PT RETURN 9FT ADLT (ELECTROSURGICAL) ×2
ELECTRODE REM PT RTRN 9FT ADLT (ELECTROSURGICAL) ×1 IMPLANT
GLOVE BIO SURGEON STRL SZ7 (GLOVE) ×4 IMPLANT
GLOVE BIOGEL PI IND STRL 7.5 (GLOVE) ×1 IMPLANT
GLOVE BIOGEL PI INDICATOR 7.5 (GLOVE) ×1
GOWN PREVENTION PLUS XLARGE (GOWN DISPOSABLE) ×4 IMPLANT
MESH HERNIA SYS ULTRAPRO LRG (Mesh General) ×2 IMPLANT
NEEDLE HYPO 22GX1.5 SAFETY (NEEDLE) ×2 IMPLANT
NS IRRIG 1000ML POUR BTL (IV SOLUTION) ×2 IMPLANT
PACK BASIN DAY SURGERY FS (CUSTOM PROCEDURE TRAY) ×2 IMPLANT
PENCIL BUTTON HOLSTER BLD 10FT (ELECTRODE) ×2 IMPLANT
SLEEVE SCD COMPRESS KNEE MED (MISCELLANEOUS) ×2 IMPLANT
SPONGE LAP 4X18 X RAY DECT (DISPOSABLE) ×2 IMPLANT
STRIP CLOSURE SKIN 1/2X4 (GAUZE/BANDAGES/DRESSINGS) IMPLANT
SUT MNCRL AB 4-0 PS2 18 (SUTURE) ×2 IMPLANT
SUT PROLENE 2 0 CT2 30 (SUTURE) ×6 IMPLANT
SUT SILK 2 0 SH (SUTURE) IMPLANT
SUT VIC AB 0 SH 27 (SUTURE) IMPLANT
SUT VIC AB 2-0 SH 27 (SUTURE) ×8
SUT VIC AB 2-0 SH 27XBRD (SUTURE) ×4 IMPLANT
SUT VIC AB 3-0 SH 27 (SUTURE) ×2
SUT VIC AB 3-0 SH 27X BRD (SUTURE) ×1 IMPLANT
SUT VICRYL AB 3 0 TIES (SUTURE) IMPLANT
SYR CONTROL 10ML LL (SYRINGE) ×2 IMPLANT
TOWEL OR 17X24 6PK STRL BLUE (TOWEL DISPOSABLE) ×2 IMPLANT
TOWEL OR NON WOVEN STRL DISP B (DISPOSABLE) ×2 IMPLANT
WATER STERILE IRR 1000ML POUR (IV SOLUTION) ×2 IMPLANT

## 2011-12-28 NOTE — Progress Notes (Signed)
Assisted Dr. Frederick with right, ultrasound guided, transabdominal plane block. Side rails up, monitors on throughout procedure. See vital signs in flow sheet. Tolerated Procedure well. 

## 2011-12-28 NOTE — Anesthesia Procedure Notes (Addendum)
Anesthesia Regional Block:  TAP block  Pre-Anesthetic Checklist: ,, timeout performed, Correct Patient, Correct Site, Correct Laterality, Correct Procedure, Correct Position, site marked, Risks and benefits discussed,  Surgical consent,  Pre-op evaluation,  At surgeon's request and post-op pain management  Laterality: Right  Prep: chloraprep       Needles:  Injection technique: Single-shot  Needle Type: Echogenic Needle     Needle Length: 9cm  Needle Gauge: 21    Additional Needles:  Procedures: ultrasound guided TAP block Narrative:  Start time: 12/28/2011 7:05 AM End time: 12/28/2011 7:10 AM Injection made incrementally with aspirations every 5 mL.  Performed by: Personally  Anesthesiologist: Aldona Lento, MD  Additional Notes: Ultrasound guidance used to: id relevant anatomy, confirm needle position, local anesthetic spread, avoidance of vascular puncture. Picture saved. No complications. Block performed personally by Janetta Hora. Gelene Mink, MD    TAP block Procedure Name: LMA Insertion Date/Time: 12/28/2011 7:29 AM Performed by: Cyani Kallstrom D Pre-anesthesia Checklist: Patient identified, Emergency Drugs available, Suction available and Patient being monitored Patient Re-evaluated:Patient Re-evaluated prior to inductionOxygen Delivery Method: Circle System Utilized Preoxygenation: Pre-oxygenation with 100% oxygen Intubation Type: IV induction Ventilation: Mask ventilation without difficulty LMA: LMA inserted LMA Size: 4.0 Number of attempts: 1 Placement Confirmation: positive ETCO2 Tube secured with: Tape Dental Injury: Teeth and Oropharynx as per pre-operative assessment

## 2011-12-28 NOTE — Anesthesia Preprocedure Evaluation (Signed)
Anesthesia Evaluation  Patient identified by MRN, date of birth, ID band Patient awake    Reviewed: Allergy & Precautions, H&P , NPO status , Patient's Chart, lab work & pertinent test results, reviewed documented beta blocker date and time   Airway Mallampati: II TM Distance: >3 FB Neck ROM: full    Dental   Pulmonary neg pulmonary ROS,          Cardiovascular neg cardio ROS     Neuro/Psych  Headaches, PSYCHIATRIC DISORDERS    GI/Hepatic Neg liver ROS,   Endo/Other  Negative Endocrine ROS  Renal/GU negative Renal ROS  Genitourinary negative   Musculoskeletal   Abdominal   Peds  Hematology negative hematology ROS (+)   Anesthesia Other Findings See surgeon's H&P   Reproductive/Obstetrics negative OB ROS                           Anesthesia Physical Anesthesia Plan  ASA: II  Anesthesia Plan: General   Post-op Pain Management: MAC Combined w/ Regional for Post-op pain   Induction: Intravenous  Airway Management Planned: LMA  Additional Equipment:   Intra-op Plan:   Post-operative Plan: Extubation in OR  Informed Consent: I have reviewed the patients History and Physical, chart, labs and discussed the procedure including the risks, benefits and alternatives for the proposed anesthesia with the patient or authorized representative who has indicated his/her understanding and acceptance.     Plan Discussed with: CRNA and Surgeon  Anesthesia Plan Comments:         Anesthesia Quick Evaluation

## 2011-12-28 NOTE — Op Note (Signed)
Preoperative diagnosis: Right inguinal hernia Postoperative diagnosis: Direct right inguinal hernia  Procedure: Right inguinal hernia repair with ultra Pro hernia system Surgeon: Dr. Harden Mo Anesthesia: TAP block with general with LMA Estimated blood loss: Minimal Specimens: None Complications: None Sponge needle counts were correct x2 at operation Disposition patient to recovery room in stable condition  Indications: This is a 56 year old male who I fixed a left inguinal repair previously. He then came back a number of months after that and he had a right groin bulge that was not present initially. He was diagnosed with a right inguinal hernia. He and I discussed an open right inguinal hernia. The mesh as we had done previously.  Procedure: After informed consent was obtained the patient was taken to the operating room. He was administered 1 g of intravenous cefazolin. Sequential compression devices were placed on his legs prior to induction of anesthesia. He had previously undergone a TAP block by anesthesia. He was then placed under general anesthesia with an LMA. His right groin was then prepped and draped in the standard sterile surgical fashion. The surgical timeout was performed.  I infiltrated quarter percent Marcaine throughout the skin where the incision was to be made. I then made an incision with a 15 blade. I carried this out down to the inferior epigastric vein. I ligated his inferior epigastric vein with 2-0 Vicryl suture and divided this. I then went through Scarpa's fascia to identify his external oblique. His external oblique was entered with a 15 blade. Metzenbaum scissors were then used to open this widely through the external ring. He was noted to have a very large direct hernia. I encircled the spermatic cord with a Penrose drain. He had no evidence of a indirect hernia. He had a small cord lipoma that was excised. I then freed the direct hernia from the spermatic cord. I  entered the direct hernia sac and reduced this in its entirety. I developed the preperitoneal space  with a Ray-Tec sponge.I then used a large UltraPro hernia system in place this in the preperitoneal space.I then closed the floor with 2-0 Vicryl overlying this. I then opened the top portion of the bilayer mesh. I made a cut and wrapped this around the spermatic cord. I sutured this with 2-0 Prolene in 2 positions to the pubic tubercle. I secured this in numerous positions to the shelving edge inferiorly. I sutured the T cut together as well as to the shelving edge. His was also secured with 2-0 Prolene superiorly to the internal oblique. This mesh all laid flat upon completion. Hemostasis was observed. The cord structures were all intact. I then closed the external oblique with 2-0 Vicryl. Scarpa's fascia was closed with 3-0 Vicryl. I then closed the skin with 4-0 Monocryl and Dermabond. He tolerated this well was extubated in the operative and transferred to recovery in stable condition.

## 2011-12-28 NOTE — H&P (Signed)
  HPI  This is a 56 year old male who I know well from a prior left lower hernia repair. He had been doing well until a few weeks ago when he began to develop some pressure, spleen, and a bulge in his right groin. This always goes away. He comes in today to discuss if he has a hernia on that side. Not really aggravated at all except by doing activities. He is relieved by lying down and just time. He has no other changes in his bowel movements. He has some occasional soreness in his left groin site as well.   PMH: diverticulosis, IBS PSH: [facility] Allergies  Allergen Reactions  . Zolpidem Tartrate Other (See Comments)    Severe headache  . Augmentin Rash  . Propoxyphene Hcl Nausea Only   Review of Systems  Constitutional: Negative for fever, chills and unexpected weight change.  HENT: Negative for hearing loss, congestion, sore throat, trouble swallowing and voice change.  Eyes: Negative for visual disturbance.  Respiratory: Negative for cough and wheezing.  Cardiovascular: Negative for chest pain, palpitations and leg swelling.  Gastrointestinal: Negative for nausea, vomiting, abdominal pain, diarrhea, constipation, blood in stool, abdominal distention, anal bleeding and rectal pain.  Genitourinary: Negative for hematuria and difficulty urinating.  Musculoskeletal: Negative for arthralgias.  Skin: Negative for rash and wound.  Neurological: Negative for seizures, syncope, weakness and headaches.  Hematological: Negative for adenopathy. Does not bruise/bleed easily.  Psychiatric/Behavioral: Negative for confusion.    Objective:   Physical Exam  Constitutional: He appears well-developed and well-nourished.  Eyes: No scleral icterus.  Cardiovascular: Normal rate, regular rhythm and normal heart sounds.  Pulmonary/Chest: Effort normal and breath sounds normal. He has no wheezes. He has no rales.  Abdominal: Soft. There is no tenderness. A hernia is present. Hernia confirmed positive in the  right inguinal area. Hernia confirmed negative in the ventral area and confirmed negative in the left inguinal area.  Genitourinary: Testes normal.      Assessment:    Right inguinal hernia   Plan:   We discussed observation versus repair. We discussed an open inguinal hernia repair. I described the procedure in detail. The patient was given educational material. Goals should be achieved with surgery. We discussed the usage of mesh and the rationale behind that. We went over the pathophysiology of inguinal hernia. We have elected to perform open inguinal hernia repair with mesh. We discussed the risks including bleeding, infection, recurrence, postoperative pain and chronic groin pain, testicular injury, urinary retention, numbness in groin and around incision.

## 2011-12-28 NOTE — Interval H&P Note (Signed)
History and Physical Interval Note:  12/28/2011 7:23 AM  Connor [name]  has presented today for surgery, with the diagnosis of bulge in groin prevent further problems  The various methods of treatment have been discussed with the patient and family. After consideration of risks, benefits and other options for treatment, the patient has consented to  Procedure(s): Right inguinal hernia repair with mesh as a surgical intervention .  The patients' history has been reviewed, patient examined, no change in status, stable for surgery.  I have reviewed the patients' chart and labs.  Questions were answered to the patient's satisfaction.     [name]

## 2011-12-28 NOTE — Transfer of Care (Signed)
Immediate Anesthesia Transfer of Care Note  Patient: Connor [name]  Procedure(s) Performed:  HERNIA REPAIR INGUINAL ADULT  Patient Location: PACU  Anesthesia Type: General and Regional  Level of Consciousness: awake, alert , oriented and patient cooperative  Airway & Oxygen Therapy: Patient Spontanous Breathing and Patient connected to face mask oxygen  Post-op Assessment: Report given to PACU RN and Post -op Vital signs reviewed and stable  Post vital signs: Reviewed and stable  Complications: No apparent anesthesia complications

## 2011-12-28 NOTE — Anesthesia Postprocedure Evaluation (Signed)
Anesthesia Post Note  Patient: Connor [name]  Procedure(s) Performed:  HERNIA REPAIR INGUINAL ADULT  Anesthesia type: General  Patient location: PACU  Post pain: Pain level controlled  Post assessment: Patient's Cardiovascular Status Stable  Last Vitals:  Filed Vitals:   12/28/11 0945  BP: 123/75  Pulse: 75  Temp:   Resp: 12    Post vital signs: Reviewed and stable  Level of consciousness: alert  Complications: No apparent anesthesia complications

## 2012-01-01 ENCOUNTER — Telehealth (INDEPENDENT_AMBULATORY_CARE_PROVIDER_SITE_OTHER): Payer: Self-pay | Admitting: General Surgery

## 2012-01-01 ENCOUNTER — Encounter (HOSPITAL_BASED_OUTPATIENT_CLINIC_OR_DEPARTMENT_OTHER): Payer: Self-pay | Admitting: General Surgery

## 2012-01-01 NOTE — Telephone Encounter (Signed)
Pt called to schedule 3wk po, from sx on 12/28/11, soonest available 01/29/12, already scheduled if this is not soon enough please call.

## 2012-01-29 ENCOUNTER — Encounter (INDEPENDENT_AMBULATORY_CARE_PROVIDER_SITE_OTHER): Payer: Self-pay | Admitting: General Surgery

## 2012-01-29 ENCOUNTER — Ambulatory Visit (INDEPENDENT_AMBULATORY_CARE_PROVIDER_SITE_OTHER): Payer: BC Managed Care – PPO | Admitting: General Surgery

## 2012-01-29 VITALS — BP 138/70 | HR 78 | Temp 97.5°F | Resp 98 | Ht 70.0 in | Wt 171.0 lb

## 2012-01-29 DIAGNOSIS — Z09 Encounter for follow-up examination after completed treatment for conditions other than malignant neoplasm: Secondary | ICD-10-CM

## 2012-01-29 NOTE — Progress Notes (Signed)
Subjective:     Patient ID: [name], male   DOB: 07-24-1956, 56 y.o.   MRN: [id number]  HPI This is a 56 year old male who I did a right inguinal hernia repair about one month ago. He comes in today without any significant complaints. He had some numbness in the distribution above his incision as well as some right buttock pain. He is otherwise doing well back to most of his normal activities at this point.  Review of Systems     Objective:   Physical Exam Well healed right groin incision without infection, no swelling or ecchymosis    Assessment:     S/p RIH repair    Plan:     Return to full normal activity I think some postop symptoms may be block related but have all resolved Return as needed

## 2012-01-29 NOTE — Patient Instructions (Signed)
May return to normal activity. 

## 2013-03-12 ENCOUNTER — Other Ambulatory Visit: Payer: Self-pay | Admitting: *Deleted

## 2013-03-12 MED ORDER — AMPHETAMINE-DEXTROAMPHET ER 10 MG PO CP24: ORAL_CAPSULE | ORAL | Status: DC

## 2013-04-10 ENCOUNTER — Other Ambulatory Visit (HOSPITAL_BASED_OUTPATIENT_CLINIC_OR_DEPARTMENT_OTHER): Payer: Self-pay | Admitting: Physician Assistant

## 2013-04-10 ENCOUNTER — Ambulatory Visit (HOSPITAL_BASED_OUTPATIENT_CLINIC_OR_DEPARTMENT_OTHER)
Admission: RE | Admit: 2013-04-10 | Discharge: 2013-04-10 | Disposition: A | Discharge status: Home / Self Care | Payer: BC Managed Care – PPO | Source: Ambulatory Visit | Attending: Physician Assistant | Admitting: Physician Assistant

## 2013-04-10 DIAGNOSIS — J984 Other disorders of lung: Secondary | ICD-10-CM | POA: Insufficient documentation

## 2013-04-10 DIAGNOSIS — Q619 Cystic kidney disease, unspecified: Secondary | ICD-10-CM | POA: Insufficient documentation

## 2013-04-10 DIAGNOSIS — M47817 Spondylosis without myelopathy or radiculopathy, lumbosacral region: Secondary | ICD-10-CM | POA: Insufficient documentation

## 2013-04-10 DIAGNOSIS — R109 Unspecified abdominal pain: Secondary | ICD-10-CM

## 2013-04-10 DIAGNOSIS — I700 Atherosclerosis of aorta: Secondary | ICD-10-CM | POA: Insufficient documentation

## 2013-04-10 DIAGNOSIS — M5144 Schmorl's nodes, thoracic region: Secondary | ICD-10-CM | POA: Insufficient documentation

## 2013-04-11 ENCOUNTER — Other Ambulatory Visit: Payer: Self-pay | Admitting: *Deleted

## 2013-04-11 MED ORDER — AMPHETAMINE-DEXTROAMPHET ER 10 MG PO CP24: ORAL_CAPSULE | ORAL | Status: DC

## 2013-04-25 ENCOUNTER — Other Ambulatory Visit: Payer: BC Managed Care – PPO

## 2013-04-25 ENCOUNTER — Other Ambulatory Visit: Payer: Self-pay | Admitting: *Deleted

## 2013-04-25 DIAGNOSIS — K5732 Diverticulitis of large intestine without perforation or abscess without bleeding: Secondary | ICD-10-CM

## 2013-04-25 DIAGNOSIS — Z79899 Other long term (current) drug therapy: Secondary | ICD-10-CM

## 2013-04-25 DIAGNOSIS — E785 Hyperlipidemia, unspecified: Secondary | ICD-10-CM

## 2013-04-25 DIAGNOSIS — I10 Essential (primary) hypertension: Secondary | ICD-10-CM

## 2013-04-26 LAB — COMPREHENSIVE METABOLIC PANEL
ALT: 35 IU/L (ref 0–44)
Albumin/Globulin Ratio: 2.6 — ABNORMAL HIGH (ref 1.1–2.5)
Alkaline Phosphatase: 55 IU/L (ref 39–117)
BUN/Creatinine Ratio: 12 (ref 9–20)
Chloride: 105 mmol/L (ref 97–108)
GFR calc Af Amer: 108 mL/min/{1.73_m2} (ref >59)
GFR calc non Af Amer: 93 mL/min/{1.73_m2} (ref >59)
Glucose: 104 mg/dL — ABNORMAL HIGH (ref 65–99)
Potassium: 4.8 mmol/L (ref 3.5–5.2)
Total Bilirubin: 0.2 mg/dL (ref 0.0–1.2)
Total Protein: 6.4 g/dL (ref 6.0–8.5)

## 2013-04-26 LAB — CBC WITH DIFFERENTIAL/PLATELET
Basos: 1 % (ref 0–3)
Eos: 6 % — ABNORMAL HIGH (ref 0–5)
Hemoglobin: 14.2 g/dL (ref 12.6–17.7)
Immature Grans (Abs): 0 10*3/uL (ref 0.0–0.1)
Lymphs: 35 % (ref 14–46)
Neutrophils Absolute: 3.5 10*3/uL (ref 1.4–7.0)
Neutrophils Relative %: 50 % (ref 40–74)
RBC: 4.51 x10E6/uL (ref 4.14–5.80)
WBC: 7 10*3/uL (ref 3.4–10.8)

## 2013-04-26 LAB — LIPID PANEL
LDL Calculated: 76 mg/dL (ref 0–99)
VLDL Cholesterol Cal: 13 mg/dL (ref 5–40)

## 2013-04-26 LAB — MICROALBUMIN / CREATININE URINE RATIO
Creatinine, Ur: 65.7 mg/dL (ref 22.0–328.0)
MICROALB/CREAT RATIO: 4.9 mg/g creat (ref 0.0–30.0)
Microalbumin, Urine: 3.2 ug/mL (ref 0.0–17.0)

## 2013-04-29 ENCOUNTER — Encounter: Payer: Self-pay | Admitting: *Deleted

## 2013-04-29 ENCOUNTER — Encounter: Payer: Self-pay | Admitting: Internal Medicine

## 2013-04-29 ENCOUNTER — Ambulatory Visit (INDEPENDENT_AMBULATORY_CARE_PROVIDER_SITE_OTHER): Payer: BC Managed Care – PPO | Admitting: Internal Medicine

## 2013-04-29 ENCOUNTER — Other Ambulatory Visit: Payer: Self-pay | Admitting: *Deleted

## 2013-04-29 VITALS — BP 124/78 | HR 80 | Temp 98.5°F | Resp 18 | Ht 70.0 in | Wt 178.6 lb

## 2013-04-29 DIAGNOSIS — M545 Low back pain, unspecified: Secondary | ICD-10-CM

## 2013-04-29 DIAGNOSIS — K589 Irritable bowel syndrome without diarrhea: Secondary | ICD-10-CM

## 2013-04-29 DIAGNOSIS — E785 Hyperlipidemia, unspecified: Secondary | ICD-10-CM

## 2013-04-29 DIAGNOSIS — I10 Essential (primary) hypertension: Secondary | ICD-10-CM

## 2013-04-29 DIAGNOSIS — F988 Other specified behavioral and emotional disorders with onset usually occurring in childhood and adolescence: Secondary | ICD-10-CM

## 2013-04-29 MED ORDER — BUTALBITAL-APAP-CAFFEINE 50-325-40 MG PO TABS: ORAL_TABLET | ORAL | Status: DC

## 2013-04-29 NOTE — Patient Instructions (Signed)
Continue current medications. 

## 2013-05-03 DIAGNOSIS — E785 Hyperlipidemia, unspecified: Secondary | ICD-10-CM | POA: Insufficient documentation

## 2013-05-03 DIAGNOSIS — K589 Irritable bowel syndrome without diarrhea: Secondary | ICD-10-CM | POA: Insufficient documentation

## 2013-05-03 DIAGNOSIS — M545 Low back pain, unspecified: Secondary | ICD-10-CM | POA: Insufficient documentation

## 2013-05-03 DIAGNOSIS — F988 Other specified behavioral and emotional disorders with onset usually occurring in childhood and adolescence: Secondary | ICD-10-CM | POA: Insufficient documentation

## 2013-05-03 NOTE — Progress Notes (Signed)
Date: 05/03/2013  MRN:  [id number] Name:  Connor [name] Sex:  male Age:  57 y.o. DOB:1956/12/22   [facility] #: [id number]                      Level Of Care:independent Provider: A. Donelle Hise  Emergency Contacts: Contact Information   Name Relation Home Work Mobile   Gioffre,Lynn Spouse [phone]  [phone]      Code Status:full  Allergies: Allergies  Allergen Reactions  . Amitriptyline   . Zolpidem Tartrate Other (See Comments)    Severe headache  . Amoxicillin-Pot Clavulanate Rash  . Propoxyphene Hcl Nausea Only     Chief Complaint  Patient presents with  . Annual Exam     HPI: Patient is to at this time. Previous problems with depression, excessive alcohol intake, anxiety, and attention deficit disorder. He under good control. He is looking for a new job he is currently employed. He continues to have occasional headaches, but they're improved over previous experiences. He denies any palpitations. Blood pressure is under good control. Irritable colon is doing well.  Past Medical History  Diagnosis Date  . Inguinal hernia 11/2011    right  . Arthritis     shoulders and hips  . Anxiety   . Sinus infection 11/2011    started antibiotic 12/18/2011 x 7 days; current cough  . High cholesterol   . ADD (attention deficit disorder)   . Eczema 11/2011    right hand  . Irritable bowel syndrome (IBS)   . Headache     tension or sinus  . Other disorders of vitreous   . Poisoning and toxic reactions caused by other specified animals and plants   . Other atopic dermatitis and related conditions   . Inguinal hernia without mention of obstruction or gangrene, unilateral or unspecified, (not specified as recurrent)   . Routine general medical examination at a health care facility   . Internal hemorrhoids without mention of complication   . Abdominal pain, left lower quadrant   . Other specified visual disturbances   . Pain in joint, pelvic region and thigh   . Special screening  for malignant neoplasm of prostate   . Recent retinal detachment, partial, with single defect   . Attention deficit disorder without mention of hyperactivity   . Encounter for long-term (current) use of other medications   . Alcohol abuse, unspecified   . Obesity, unspecified   . Unspecified essential hypertension   . Atrial fibrillation   . Edema   . Major depressive disorder, recurrent episode, severe, without mention of psychotic behavior   . Depressive disorder, not elsewhere classified   . Actinic keratosis   . Sciatica   . Other and unspecified hyperlipidemia   . Other seborrheic keratosis   . Anxiety state, unspecified   . Tension headache   . Insomnia, unspecified   . Pathologic fracture of vertebrae   . Lumbago   . Cervicalgia   . Type II or unspecified type diabetes mellitus without mention of complication, not stated as uncontrolled   . Pain in joint, site unspecified   . Irritable bowel syndrome     Past Surgical History  Procedure Laterality Date  . Nasal sinus surgery  12/2010  . Cataract extraction  2011    right eye  . Appendectomy  1987  . Hernia repair  05/03/2011    left  . Retinal detachment repair w/ scleral buckle le  07/22/2008    right  eye; pars plana vitrectomy  . Inguinal hernia repair  12/28/2011    Procedure: HERNIA REPAIR INGUINAL ADULT;  Surgeon: Emelia Loron, MD;  Location: [facility];  Service: General;  Laterality: Right;  . Tonsillectomy and adenoidectomy  1964  . Removed anal warts  1976    Dr Terri Piedra   . Nasal polyp surgery  01/05/2011    DR. NEWMAN      Procedures: 7/03- Holter: Normal  03/87 - CT Brain: Normal 05/27/2003 -CT Head: Sinuitis 1/03-Colonoscopy: Diverticulitis  09/03- MRI Lumbar spine: Spondylosis  05/22/2006-MRI Lumbar Spine:No evidence of canal or foraminal stenosis or disc herniation in the lumbar spine. Mild minimal spondylosis involving the facet joints L4-L5 and L5-S1. Compression fracture involving  the superior endplate of T11, which appears chronic. Tiny cyst in lower pole of Right kidney   04/10/13 CT abd/ pelvis: 1. No acute abdominal pelvic findings identified.    2. No evidence of urinary tract calculus or hydronephrosis.    3. Slight enlargement of low density hepatic lesions still most    consistent with a benign process (cysts or hemangiomas).    4. No recurrent diverticulitis identified   Consultants: Dr. Rolene Course -General surgery Dr. Loleta Chance -Dermatology  Dr.Shapiro/ Dr. Holland Commons Dr.Langdon-Dentist  Dr. Emelia Loron: Gen. surgery    Current Outpatient Prescriptions  Medication Sig Dispense Refill  . amphetamine-dextroamphetamine (ADDERALL XR) 10 MG 24 hr capsule Take one tablet once a day for nerves  30 capsule  0  . clonazePAM (KLONOPIN) 0.5 MG tablet Take 0.5 mg by mouth 3 (three) times daily as needed.       . dicyclomine (BENTYL) 20 MG tablet Take 20 mg by mouth as needed.       . halobetasol (ULTRAVATE) 0.05 % cream Apply topically 2 (two) times daily. Apply to rash up to three times a day      . lovastatin (MEVACOR) 20 MG tablet Take 20 mg by mouth at bedtime.       . Multiple Vitamin (MULTIVITAMIN) tablet Take 1 tablet by mouth daily.        . naproxen sodium (ANAPROX) 220 MG tablet Take 220 mg by mouth 2 (two) times daily with a meal.        . butalbital-acetaminophen-caffeine (FIORICET) 50-325-40 MG per tablet Take one tablet four times a day as needed for headaches  120 tablet  5   No current facility-administered medications for this visit.    Immunization History  Administered Date(s) Administered  . DTaP 05/31/2006     Diet:regular  History  Substance Use Topics  . Smoking status: Former Smoker    Types: Cigarettes    Quit date: 05/30/1995  . Smokeless tobacco: Never Used     Comment: quit smoking 14 yrs. ago  . Alcohol Use: Yes     Comment: 1-3 beers/day    Family History  Problem Relation Age of Onset  . Stroke Father   . Cancer  Brother     prostate  . Cancer Paternal Grandfather     colon      Review of Systems -  History obtained from the patient General ROS: positive for  - fatigue Psychological ROS: positive for - anxiety and concentration difficulties negative for - behavioral disorder, hostility, memory difficulties, obsessive thoughts, physical abuse, sexual abuse or sleep disturbances Ophthalmic ROS: positive for - uses glasses negative for - double vision, dry eyes, excessive tearing, eye pain or photophobia ENT ROS: negative positive for - headaches negative for -  nasal congestion, oral lesions or sore throat Allergy and Immunology ROS: negative Hematological and Lymphatic ROS: negative Endocrine ROS: negative Breast ROS: negative Respiratory ROS: no cough, shortness of breath, or wheezing Cardiovascular ROS: no chest pain or dyspnea on exertion Gastrointestinal ROS: no abdominal pain, change in bowel habits, or black or bloody stools Genito-Urinary ROS: no dysuria, trouble voiding, or hematuria Musculoskeletal ROS: negative positive for - joint pain Neurological ROS: no TIA or stroke symptoms Dermatological ROS: negative    Physical exam BP 124/78  Pulse 80  Temp(Src) 98.5 F (36.9 C)  Resp 18  Ht 5\' 10"  (1.778 m)  Wt 178 lb 9.6 oz (81.012 kg)  BMI 25.63 kg/m2  General Appearance:    Alert, cooperative, no distress, appears stated age  Head:    Normocephalic, without obvious abnormality, atraumatic  Eyes:    PERRL, conjunctiva/corneas clear, EOM's intact, fundi    benign, both eyes . Reduced right visual acuity      Ears:    Normal TM's and external ear canals, both ears  Nose:   Nares normal, septum midline, mucosa normal, no drainage   or sinus tenderness  Throat:   Lips, mucosa, and tongue normal; teeth and gums normal  Neck:   Supple, symmetrical, trachea midline, no adenopathy;       thyroid:  No enlargement/tenderness/nodules; no carotid   bruit or JVD  Back:      Symmetric, no curvature, ROM normal, no CVA tenderness  Lungs:     Clear to auscultation bilaterally, respirations unlabored  Chest wall:    No tenderness or deformity  Heart:    Regular rate and rhythm, S1 and S2 normal, no murmur, rub   or gallop  Abdomen:     Soft, non-tender, bowel sounds active all four quadrants,    no masses, no organomegaly  Genitalia:    Normal male without lesion, discharge or tenderness  Rectal:    Normal tone, normal prostate, no masses or tenderness;   guaiac negative stool. Prostate normal, no nodules.  Extremities:   Extremities normal, atraumatic, no cyanosis or edema  Pulses:   2+ and symmetric all extremities  Skin:   Skin color, texture, turgor normal, no rashes or lesions  Lymph nodes:   Cervical, supraclavicular, and axillary nodes normal  Neurologic:   CNII-XII intact. Normal strength, sensation and reflexes      Throughout   04/28/2011 CBC Wbc 6.9 Rbc 4.58 Hemoglobin 14.5  CMP Glucose 92 Bun 13 Creatinine 0.88  Lipid Panel Cholesterol 159 Triglycerides 59 Hdl 58 Ldl 89  PSA 2.4  TSH 1.450  05/13/2012 CBC Wbc 6.2 Rbc 4.34 Hemoglobin 13.8  CMP Glucose 91 Bun 16 Creatinine 0.94  HA1C 5.8  Lipid Panel Cholesterol 158 Triglycerides 39 Hdl 68 Ldl 82 05/15/12 EKG: rate 82. NSR.  CMP     Component Value Date/Time   NA 143 04/25/2013 1153   NA 139 12/25/2011 1130   K 4.8 04/25/2013 1153   CL 105 04/25/2013 1153   CO2 24 04/25/2013 1153   GLUCOSE 104* 04/25/2013 1153   GLUCOSE 103* 12/25/2011 1130   BUN 11 04/25/2013 1153   BUN 13 12/25/2011 1130   CREATININE 0.92 04/25/2013 1153   CALCIUM 9.0 04/25/2013 1153   PROT 6.4 04/25/2013 1153   AST 18 04/25/2013 1153   ALT 35 04/25/2013 1153   ALKPHOS 55 04/25/2013 1153   BILITOT 0.2 04/25/2013 1153   GFRNONAA 93 04/25/2013 1153   GFRAA 108 04/25/2013 1153  CBC    Component Value Date/Time   WBC 7.0 04/25/2013 1153   WBC 7.5 12/25/2011 1130   RBC 4.51 04/25/2013 1153   RBC 4.30 12/25/2011 1130   HGB 14.2 04/25/2013 1153    HCT 41.9 04/25/2013 1153   PLT 253 12/25/2011 1130   MCV 93 04/25/2013 1153   MCH 31.5 04/25/2013 1153   MCH 32.1 12/25/2011 1130   MCHC 33.9 04/25/2013 1153   MCHC 34.5 12/25/2011 1130   RDW 13.0 04/25/2013 1153   RDW 12.4 12/25/2011 1130   LYMPHSABS 2.5 04/25/2013 1153   EOSABS 0.4 04/25/2013 1153   BASOSABS 0.0 04/25/2013 1153    Lipid Panel     Component Value Date/Time   TRIG 66 04/25/2013 1153   HDL 65 04/25/2013 1153   CHOLHDL 2.4 04/25/2013 1153   LDLCALC 76 04/25/2013 1153       Screening Score  MMS    PHQ2 0  PHQ9     Fall Risk    BIMS    Annual summary: Hospitalizations:12/28/2011: Hernia repair Infection History: none of consequence Functional assessment:independent in all ADL Areas of potential improvement:functioning highest acute level Rehabilitation Potential:functioning at highest achievable Prognosis for survival:.good  Plan: 1. Other and unspecified hyperlipidemia Currently under good control - Lipid panel; Future  2. Unspecified essential hypertension controlled  3. ADD (attention deficit disorder) Controlled with medication  4. Irritable bowel syndrome (IBS) controlled  5. Lumbago Chronic discomfort. Current medications adequate for relief.

## 2013-05-12 ENCOUNTER — Other Ambulatory Visit: Payer: Self-pay | Admitting: *Deleted

## 2013-05-12 MED ORDER — AMPHETAMINE-DEXTROAMPHET ER 10 MG PO CP24: ORAL_CAPSULE | ORAL | Status: DC

## 2013-06-11 ENCOUNTER — Other Ambulatory Visit: Payer: Self-pay | Admitting: Geriatric Medicine

## 2013-06-11 MED ORDER — AMPHETAMINE-DEXTROAMPHET ER 10 MG PO CP24: ORAL_CAPSULE | ORAL | Status: DC

## 2013-07-15 ENCOUNTER — Other Ambulatory Visit: Payer: Self-pay | Admitting: Geriatric Medicine

## 2013-07-15 MED ORDER — AMPHETAMINE-DEXTROAMPHET ER 10 MG PO CP24: ORAL_CAPSULE | ORAL | Status: DC

## 2013-07-30 ENCOUNTER — Ambulatory Visit (INDEPENDENT_AMBULATORY_CARE_PROVIDER_SITE_OTHER): Payer: BC Managed Care – PPO | Admitting: Nurse Practitioner

## 2013-07-30 ENCOUNTER — Encounter: Payer: Self-pay | Admitting: Nurse Practitioner

## 2013-07-30 VITALS — BP 168/92 | HR 77 | Temp 99.0°F | Resp 13 | Ht 70.0 in | Wt 177.0 lb

## 2013-07-30 DIAGNOSIS — K5732 Diverticulitis of large intestine without perforation or abscess without bleeding: Secondary | ICD-10-CM

## 2013-07-30 MED ORDER — HYDROCODONE-ACETAMINOPHEN 5-325 MG PO TABS: 1.0000 | ORAL_TABLET | Freq: Four times a day (QID) | ORAL | Status: DC | PRN

## 2013-07-30 MED ORDER — CIPROFLOXACIN HCL 500 MG PO TABS: ORAL_TABLET | ORAL | Status: DC

## 2013-07-30 MED ORDER — METRONIDAZOLE 500 MG PO TABS: ORAL_TABLET | ORAL | Status: DC

## 2013-07-30 NOTE — Patient Instructions (Signed)
Diverticulitis  A diverticulum is a small pouch or sac on the colon. Diverticulosis is the presence of these diverticula on the colon. Diverticulitis is the irritation (inflammation) or infection of diverticula.  CAUSES   The colon and its diverticula contain bacteria. If food particles block the tiny opening to a diverticulum, the bacteria inside can grow and cause an increase in pressure. This leads to infection and inflammation and is called diverticulitis.  SYMPTOMS   · Abdominal pain and tenderness. Usually, the pain is located on the left side of your abdomen. However, it could be located elsewhere.  · Fever.  · Bloating.  · Feeling sick to your stomach (nausea).  · Throwing up (vomiting).  · Abnormal stools.  DIAGNOSIS   Your caregiver will take a history and perform a physical exam. Since many things can cause abdominal pain, other tests may be necessary. Tests may include:  · Blood tests.  · Urine tests.  · X-ray of the abdomen.  · CT scan of the abdomen.  Sometimes, surgery is needed to determine if diverticulitis or other conditions are causing your symptoms.  TREATMENT   Most of the time, you can be treated without surgery. Treatment includes:  · Resting the bowels by only having liquids for a few days. As you improve, you will need to eat a low-fiber diet.  · Intravenous (IV) fluids if you are losing body fluids (dehydrated).  · Antibiotic medicines that treat infections may be given.  · Pain and nausea medicine, if needed.  · Surgery if the inflamed diverticulum has burst.  HOME CARE INSTRUCTIONS   · Try a clear liquid diet (broth, tea, or water for as long as directed by your caregiver). You may then gradually begin a low-fiber diet as tolerated.   A low-fiber diet is a diet with less than 10 grams of fiber. Choose the foods below to reduce fiber in the diet:  · White breads, cereals, rice, and pasta.  · Cooked fruits and vegetables or soft fresh fruits and vegetables without the skin.  · Ground or  well-cooked tender beef, ham, veal, lamb, pork, or poultry.  · Eggs and seafood.  · After your diverticulitis symptoms have improved, your caregiver may put you on a high-fiber diet. A high-fiber diet includes 14 grams of fiber for every 1000 calories consumed. For a standard 2000 calorie diet, you would need 28 grams of fiber. Follow these diet guidelines to help you increase the fiber in your diet. It is important to slowly increase the amount fiber in your diet to avoid gas, constipation, and bloating.  · Choose whole-grain breads, cereals, pasta, and brown rice.  · Choose fresh fruits and vegetables with the skin on. Do not overcook vegetables because the more vegetables are cooked, the more fiber is lost.  · Choose more nuts, seeds, legumes, dried peas, beans, and lentils.  · Look for food products that have greater than 3 grams of fiber per serving on the Nutrition Facts label.  · Take all medicine as directed by your caregiver.  · If your caregiver has given you a follow-up appointment, it is very important that you go. Not going could result in lasting (chronic) or permanent injury, pain, and disability. If there is any problem keeping the appointment, call to reschedule.  SEEK MEDICAL CARE IF:   · Your pain does not improve.  · You have a hard time advancing your diet beyond clear liquids.  · Your bowel movements do not return   to normal.  SEEK IMMEDIATE MEDICAL CARE IF:   · Your pain becomes worse.  · You have an oral temperature above 102° F (38.9° C), not controlled by medicine.  · You have repeated vomiting.  · You have bloody or black, tarry stools.  · Symptoms that brought you to your caregiver become worse or are not getting better.  MAKE SURE YOU:   · Understand these instructions.  · Will watch your condition.  · Will get help right away if you are not doing well or get worse.  Document Released: 09/20/2005 Document Revised: 03/04/2012 Document Reviewed: 01/16/2011  ExitCare® Patient Information  ©2014 [employer].  Diverticulosis  Diverticulosis is a common condition that develops when small pouches (diverticula) form in the wall of the colon. The risk of diverticulosis increases with age. It happens more often in people who eat a low-fiber diet. Most individuals with diverticulosis have no symptoms. Those individuals with symptoms usually experience abdominal pain, constipation, or loose stools (diarrhea).  HOME CARE INSTRUCTIONS   · Increase the amount of fiber in your diet as directed by your caregiver or dietician. This may reduce symptoms of diverticulosis.  · Your caregiver may recommend taking a dietary fiber supplement.  · Drink at least 6 to 8 glasses of water each day to prevent constipation.  · Try not to strain when you have a bowel movement.  · Your caregiver may recommend avoiding nuts and seeds to prevent complications, although this is still an uncertain benefit.  · Only take over-the-counter or prescription medicines for pain, discomfort, or fever as directed by your caregiver.  FOODS WITH HIGH FIBER CONTENT INCLUDE:  · Fruits. Apple, peach, pear, tangerine, raisins, prunes.  · Vegetables. Brussels sprouts, asparagus, broccoli, cabbage, carrot, cauliflower, romaine lettuce, spinach, summer squash, tomato, winter squash, zucchini.  · Starchy Vegetables. Baked beans, kidney beans, lima beans, split peas, lentils, potatoes (with skin).  · Grains. Whole wheat bread, brown rice, bran flake cereal, plain oatmeal, white rice, shredded wheat, bran muffins.  SEEK IMMEDIATE MEDICAL CARE IF:   · You develop increasing pain or severe bloating.  · You have an oral temperature above 102° F (38.9° C), not controlled by medicine.  · You develop vomiting or bowel movements that are bloody or black.  Document Released: 09/07/2004 Document Revised: 03/04/2012 Document Reviewed: 05/11/2010  ExitCare® Patient Information ©2014 [employer].

## 2013-07-30 NOTE — Progress Notes (Signed)
Patient ID: Connor [name], male   DOB: May 30, 1956, 56 y.o.   MRN: [id number]   Allergies  Allergen Reactions  . Amitriptyline   . Zolpidem Tartrate Other (See Comments)    Severe headache  . Amoxicillin-Pot Clavulanate Rash  . Propoxyphene Hcl Nausea Only    Chief Complaint  Patient presents with  . Diverticulitis    HPI: Patient is a 57 y.o. male seen in the office today for acute episode of diverticulitis Reports he already had 2 flares this year. Having abdominal pain. Denies diarrhea, blood in stool, N?V  Has noted fever. Symptoms started yesterday but this morning it was worse. Tender in abdomen. Goes to urgent care if he can not get into office. Still keeping hydrated and eating.  Takes cipro and flayl which works for him.   Review of Systems:  Review of Systems  Constitutional: Positive for fever and malaise/fatigue.  Respiratory: Negative for shortness of breath.   Cardiovascular: Negative for chest pain.  Gastrointestinal: Positive for abdominal pain. Negative for nausea, vomiting, diarrhea and constipation.  Genitourinary: Negative for dysuria, urgency and frequency.  Musculoskeletal: Negative.   Skin: Negative.      Past Medical History  Diagnosis Date  . Inguinal hernia 11/2011    right  . Arthritis     shoulders and hips  . Anxiety   . Sinus infection 11/2011    started antibiotic 12/18/2011 x 7 days; current cough  . High cholesterol   . ADD (attention deficit disorder)   . Eczema 11/2011    right hand  . Irritable bowel syndrome (IBS)   . Headache(784.0)     tension or sinus  . Other disorders of vitreous   . Poisoning and toxic reactions caused by other specified animals and plants   . Other atopic dermatitis and related conditions   . Inguinal hernia without mention of obstruction or gangrene, unilateral or unspecified, (not specified as recurrent)   . Routine general medical examination at a health care facility   . Internal hemorrhoids without  mention of complication   . Abdominal pain, left lower quadrant   . Other specified visual disturbances   . Pain in joint, pelvic region and thigh   . Special screening for malignant neoplasm of prostate   . Recent retinal detachment, partial, with single defect   . Attention deficit disorder without mention of hyperactivity   . Encounter for long-term (current) use of other medications   . Alcohol abuse, unspecified   . Obesity, unspecified   . Unspecified essential hypertension   . Atrial fibrillation   . Edema   . Major depressive disorder, recurrent episode, severe, without mention of psychotic behavior   . Depressive disorder, not elsewhere classified   . Actinic keratosis   . Sciatica   . Other and unspecified hyperlipidemia   . Other seborrheic keratosis   . Anxiety state, unspecified   . Tension headache   . Insomnia, unspecified   . Pathologic fracture of vertebrae   . Lumbago   . Cervicalgia   . Type II or unspecified type diabetes mellitus without mention of complication, not stated as uncontrolled   . Pain in joint, site unspecified   . Irritable bowel syndrome    Past Surgical History  Procedure Laterality Date  . Nasal sinus surgery  12/2010  . Cataract extraction  2011    right eye  . Appendectomy  1987  . Hernia repair  05/03/2011    left  . Retinal detachment  repair w/ scleral buckle le  07/22/2008    right eye; pars plana vitrectomy  . Inguinal hernia repair  12/28/2011    Procedure: HERNIA REPAIR INGUINAL ADULT;  Surgeon: Emelia Loron, MD;  Location: [facility];  Service: General;  Laterality: Right;  . Tonsillectomy and adenoidectomy  1964  . Removed anal warts  1976    Dr Terri Piedra   . Nasal polyp surgery  01/05/2011    DR. NEWMAN    Social History:   reports that he quit smoking about 18 years ago. His smoking use included Cigarettes. He smoked 0.00 packs per day. He has never used smokeless tobacco. He reports that  drinks alcohol. He  reports that he does not use illicit drugs.  Family History  Problem Relation Age of Onset  . Stroke Father   . Cancer Brother     prostate  . Cancer Paternal Grandfather     colon    Medications: Patient's Medications  New Prescriptions   No medications on file  Previous Medications   AMPHETAMINE-DEXTROAMPHETAMINE (ADDERALL XR) 10 MG 24 HR CAPSULE    Take one tablet once a day for nerves   BUTALBITAL-ACETAMINOPHEN-CAFFEINE (FIORICET) 50-325-40 MG PER TABLET    Take one tablet four times a day as needed for headaches   CLONAZEPAM (KLONOPIN) 0.5 MG TABLET    Take 0.5 mg by mouth 3 (three) times daily as needed.    DICYCLOMINE (BENTYL) 20 MG TABLET    Take 20 mg by mouth as needed.    FLUTICASONE (FLONASE) 50 MCG/ACT NASAL SPRAY    Place 2 sprays into the nose daily.   HALOBETASOL (ULTRAVATE) 0.05 % CREAM    Apply topically 2 (two) times daily. Apply to rash up to three times a day   LOVASTATIN (MEVACOR) 20 MG TABLET    Take 20 mg by mouth at bedtime.    MULTIPLE VITAMIN (MULTIVITAMIN) TABLET    Take 1 tablet by mouth daily.     NAPROXEN SODIUM (ANAPROX) 220 MG TABLET    Take 220 mg by mouth 2 (two) times daily with a meal.    Modified Medications   No medications on file  Discontinued Medications   No medications on file     Physical Exam:  Filed Vitals:   07/30/13 1440  BP: 168/92  Pulse: 77  Temp: 99 F (37.2 C)  TempSrc: Oral  Resp: 13  Height: 5\' 10"  (1.778 m)  Weight: 177 lb (80.287 kg)    GENERAL APPEARANCE: Alert, conversant. Appropriately groomed. No acute distress.  SKIN: No diaphoresis rash, or wounds HEAD: Normocephalic, atraumatic  MOUTH/THROAT: Lips w/o lesions. Mouth and throat normal. Tongue moist, w/o lesion.  NECK: No thyroid tenderness, enlargement or nodule  RESPIRATORY: Breathing is even, unlabored. Lung sounds are clear   CARDIOVASCULAR: Heart RRR no murmurs, rubs or gallops. No peripheral edema.  ARTERIAL: radial pulse 2+ VENOUS: No  varicosities. No venous stasis skin changes  GASTROINTESTINAL: Abdomen is soft, tender in lower left quad, not distended w/ normal bowel sounds.  MUSCULOSKELETAL: No abnormal joints or musculature NEUROLOGIC: Oriented X3. Cranial nerves 2-12 grossly intact. Moves all extremities no tremor. PSYCHIATRIC: Mood and affect appropriate to situation, no behavioral issues  Labs reviewed: Basic Metabolic Panel:  Recent Labs  40/98/11 1153  NA 143  K 4.8  CL 105  CO2 24  GLUCOSE 104*  BUN 11  CREATININE 0.92  CALCIUM 9.0   Liver Function Tests:  Recent Labs  04/25/13 1153  AST 18  ALT 35  ALKPHOS 55  BILITOT 0.2  PROT 6.4   No results found for this basename: LIPASE, AMYLASE,  in the last 8760 hours No results found for this basename: AMMONIA,  in the last 8760 hours CBC:  Recent Labs  04/25/13 1153  WBC 7.0  NEUTROABS 3.5  HGB 14.2  HCT 41.9  MCV 93   Lipid Panel:  Recent Labs  04/25/13 1153  HDL 65  LDLCALC 76  TRIG 66  CHOLHDL 2.4      Assessment/Plan 1. Diverticulitis of colon (without mention of hemorrhage) Will start medications.  - metroNIDAZOLE (FLAGYL) 500 MG tablet; 1 tablet three times a day for 10 days  Dispense: 30 tablet; Refill: 0 - ciprofloxacin (CIPRO) 500 MG tablet; 1 tablet twice daily for 10 days  Dispense: 20 tablet; Refill: 0 Will give short term pain medication for pain relief during acute flare- HYDROcodone-acetaminophen (NORCO/VICODIN) 5-325 MG per tablet; Take 1 tablet by mouth  every 6 (six) hours as needed for pain.  Dispense: 30 tablet; Refill: 0 Pt aware of when to follow up or seek emergency care.

## 2013-08-14 ENCOUNTER — Other Ambulatory Visit: Payer: Self-pay | Admitting: Geriatric Medicine

## 2013-08-14 MED ORDER — AMPHETAMINE-DEXTROAMPHET ER 10 MG PO CP24: ORAL_CAPSULE | ORAL | Status: DC

## 2013-08-19 ENCOUNTER — Other Ambulatory Visit: Payer: Self-pay | Admitting: Internal Medicine

## 2013-09-15 ENCOUNTER — Other Ambulatory Visit: Payer: Self-pay | Admitting: *Deleted

## 2013-09-15 MED ORDER — AMPHETAMINE-DEXTROAMPHET ER 10 MG PO CP24: ORAL_CAPSULE | ORAL | Status: DC

## 2013-10-07 ENCOUNTER — Other Ambulatory Visit: Payer: Self-pay | Admitting: *Deleted

## 2013-10-07 DIAGNOSIS — E119 Type 2 diabetes mellitus without complications: Secondary | ICD-10-CM

## 2013-10-07 DIAGNOSIS — E7800 Pure hypercholesterolemia, unspecified: Secondary | ICD-10-CM

## 2013-10-15 ENCOUNTER — Other Ambulatory Visit: Payer: Self-pay | Admitting: *Deleted

## 2013-10-15 MED ORDER — AMPHETAMINE-DEXTROAMPHET ER 10 MG PO CP24: ORAL_CAPSULE | ORAL | Status: DC

## 2013-10-24 ENCOUNTER — Other Ambulatory Visit: Payer: BC Managed Care – PPO

## 2013-10-24 DIAGNOSIS — E119 Type 2 diabetes mellitus without complications: Secondary | ICD-10-CM

## 2013-10-24 DIAGNOSIS — E7800 Pure hypercholesterolemia, unspecified: Secondary | ICD-10-CM

## 2013-10-25 LAB — COMPREHENSIVE METABOLIC PANEL
AST: 22 IU/L (ref 0–40)
Albumin/Globulin Ratio: 2.8 — ABNORMAL HIGH (ref 1.1–2.5)
BUN/Creatinine Ratio: 13 (ref 9–20)
CO2: 23 mmol/L (ref 18–29)
Calcium: 8.9 mg/dL (ref 8.7–10.2)
Creatinine, Ser: 0.96 mg/dL (ref 0.76–1.27)
GFR calc Af Amer: 102 mL/min/{1.73_m2} (ref >59)
GFR calc non Af Amer: 88 mL/min/{1.73_m2} (ref >59)
Globulin, Total: 1.6 g/dL (ref 1.5–4.5)
Sodium: 141 mmol/L (ref 134–144)
Total Bilirubin: 0.2 mg/dL (ref 0.0–1.2)

## 2013-10-25 LAB — LIPID PANEL
Chol/HDL Ratio: 2.3 ratio units (ref 0.0–5.0)
HDL: 68 mg/dL (ref >39)
LDL Calculated: 76 mg/dL (ref 0–99)
Triglycerides: 45 mg/dL (ref 0–149)
VLDL Cholesterol Cal: 9 mg/dL (ref 5–40)

## 2013-10-27 ENCOUNTER — Other Ambulatory Visit: Payer: BC Managed Care – PPO

## 2013-10-29 ENCOUNTER — Ambulatory Visit (INDEPENDENT_AMBULATORY_CARE_PROVIDER_SITE_OTHER): Payer: BC Managed Care – PPO | Admitting: Internal Medicine

## 2013-10-29 ENCOUNTER — Encounter: Payer: Self-pay | Admitting: Internal Medicine

## 2013-10-29 VITALS — BP 162/88 | HR 83 | Temp 98.4°F | Ht 70.0 in | Wt 176.0 lb

## 2013-10-29 DIAGNOSIS — H7391 Unspecified disorder of tympanic membrane, right ear: Secondary | ICD-10-CM

## 2013-10-29 DIAGNOSIS — F988 Other specified behavioral and emotional disorders with onset usually occurring in childhood and adolescence: Secondary | ICD-10-CM

## 2013-10-29 DIAGNOSIS — E785 Hyperlipidemia, unspecified: Secondary | ICD-10-CM

## 2013-10-29 DIAGNOSIS — K589 Irritable bowel syndrome without diarrhea: Secondary | ICD-10-CM

## 2013-10-29 DIAGNOSIS — M25571 Pain in right ankle and joints of right foot: Secondary | ICD-10-CM

## 2013-10-29 DIAGNOSIS — M25519 Pain in unspecified shoulder: Secondary | ICD-10-CM | POA: Insufficient documentation

## 2013-10-29 DIAGNOSIS — M25579 Pain in unspecified ankle and joints of unspecified foot: Secondary | ICD-10-CM | POA: Insufficient documentation

## 2013-10-29 DIAGNOSIS — M545 Low back pain, unspecified: Secondary | ICD-10-CM

## 2013-10-29 DIAGNOSIS — H7390 Unspecified disorder of tympanic membrane, unspecified ear: Secondary | ICD-10-CM | POA: Insufficient documentation

## 2013-10-29 DIAGNOSIS — I10 Essential (primary) hypertension: Secondary | ICD-10-CM

## 2013-10-29 DIAGNOSIS — M25511 Pain in right shoulder: Secondary | ICD-10-CM

## 2013-10-29 MED ORDER — HYOSCYAMINE SULFATE 0.125 MG PO TABS: ORAL_TABLET | ORAL | Status: DC

## 2013-10-29 NOTE — Progress Notes (Signed)
Subjective:    Patient ID: Connor [name], male    DOB: 11/01/1956, 57 y.o.   MRN: [id number]  Chief Complaint  Patient presents with  . Medical Managment of Chronic Issues    6 month follow-up, discuss labs (copy printed)   . Shoulder Pain    Bilateral shoulder pain, ongoing concern   . Foot Pain    Right foot pain, ongoing concern    HPI Pain in joint, ankle and foot, right: sudden onset a couple of weeks ago. Pain in the lateral aspecr of the right foot. Occurred suddenly when he was walking across the room. Using Aleve. Now improved. Still hurts a little to bear weight and walk.  ADD (attention deficit disorder): stable  Unspecified essential hypertension: modest elevation in SBP  Other and unspecified hyperlipidemia:controlled  Lumbago: improved  Irritable bowel syndrome (IBS): dicyclomine is not working as well  Pain in joint, shoulder region, right; occurs at night and wakes him  Tympanic membrane disorder, right: has had 2 infections this year. Ear is uncomfortable again. No drainage. No fever.    Current Outpatient Prescriptions on File Prior to Visit  Medication Sig Dispense Refill  . amphetamine-dextroamphetamine (ADDERALL XR) 10 MG 24 hr capsule Take one tablet once a day for nerves  30 capsule  0  . butalbital-acetaminophen-caffeine (FIORICET) 50-325-40 MG per tablet Take one tablet four times a day as needed for headaches  120 tablet  5  . clonazePAM (KLONOPIN) 0.5 MG tablet Take 0.5 mg by mouth 3 (three) times daily as needed.       . dicyclomine (BENTYL) 20 MG tablet Take 20 mg by mouth as needed.       . fluticasone (FLONASE) 50 MCG/ACT nasal spray Place 2 sprays into the nose daily.      . halobetasol (ULTRAVATE) 0.05 % cream Apply topically 2 (two) times daily. Apply to rash up to three times a day      . lovastatin (MEVACOR) 20 MG tablet take 1 tablet by mouth at bedtime to CONTROL cholesterol  30 tablet  12  . Multiple Vitamin (MULTIVITAMIN) tablet Take  1 tablet by mouth daily.        . naproxen sodium (ANAPROX) 220 MG tablet Take 220 mg by mouth 2 (two) times daily with a meal.         No current facility-administered medications on file prior to visit.    Review of Systems  Constitutional: Positive for fatigue.  HENT: Positive for ear pain (right ear discomfort).   Eyes: Positive for visual disturbance (corrective lenses).  Cardiovascular: Negative for chest pain, palpitations and leg swelling.  Gastrointestinal: Negative for nausea, abdominal pain, diarrhea, constipation and abdominal distention.       Intermittent abd spasm and discomfort that is linked to his IBS.   Endocrine: Negative.   Musculoskeletal: Positive for arthralgias.  Skin: Negative.   Allergic/Immunologic: Negative.   Neurological: Positive for headaches.  Hematological: Negative.   Psychiatric/Behavioral: Positive for decreased concentration. The patient is nervous/anxious.        Objective:   Physical Exam  Constitutional: He is oriented to person, place, and time. He appears well-developed and well-nourished. No distress.  HENT:  Head: Normocephalic and atraumatic.  Right Ear: External ear normal.  Left Ear: External ear normal.  Nose: Nose normal.  Mouth/Throat: Oropharynx is clear and moist.  Retracted right TM. Some erythema superiorly  Eyes: Conjunctivae and EOM are normal. Pupils are equal, round, and reactive to light.  Neck: No JVD present. No tracheal deviation present. No thyromegaly present.  Cardiovascular: Normal rate, regular rhythm and intact distal pulses.  Exam reveals no gallop and no friction rub.   No murmur heard. Pulmonary/Chest: No respiratory distress. He has no wheezes. He has no rales.  Abdominal: He exhibits no distension and no mass. There is no tenderness.  Musculoskeletal: Normal range of motion. He exhibits no edema and no tenderness.  Lymphadenopathy:    He has no cervical adenopathy.  Neurological: He is alert and  oriented to person, place, and time. No cranial nerve deficit. Coordination normal.  Skin: No rash noted. No erythema. No pallor.  Psychiatric: He has a normal mood and affect. His behavior is normal. Judgment and thought content normal.    Appointment on 10/24/2013  Component Date Value Range Status  . Glucose 10/24/2013 93  65 - 99 mg/dL Final  . BUN 16/09/9603 12  6 - 24 mg/dL Final  . Creatinine, Ser 10/24/2013 0.96  0.76 - 1.27 mg/dL Final  . GFR calc non Af Amer 10/24/2013 88  >59 mL/min/1.73 Final  . GFR calc Af Amer 10/24/2013 102  >59 mL/min/1.73 Final  . BUN/Creatinine Ratio 10/24/2013 13  9 - 20 Final  . Sodium 10/24/2013 141  134 - 144 mmol/L Final  . Potassium 10/24/2013 4.6  3.5 - 5.2 mmol/L Final  . Chloride 10/24/2013 100  97 - 108 mmol/L Final  . CO2 10/24/2013 23  18 - 29 mmol/L Final  . Calcium 10/24/2013 8.9  8.7 - 10.2 mg/dL Final  . Total Protein 10/24/2013 6.0  6.0 - 8.5 g/dL Final  . Albumin 54/08/8118 4.4  3.5 - 5.5 g/dL Final  . Globulin, Total 10/24/2013 1.6  1.5 - 4.5 g/dL Final  . Albumin/Globulin Ratio 10/24/2013 2.8* 1.1 - 2.5 Final  . Total Bilirubin 10/24/2013 0.2  0.0 - 1.2 mg/dL Final  . Alkaline Phosphatase 10/24/2013 67  39 - 117 IU/L Final  . AST 10/24/2013 22  0 - 40 IU/L Final  . ALT 10/24/2013 26  0 - 44 IU/L Final  . Cholesterol, Total 10/24/2013 153  100 - 199 mg/dL Final  . Triglycerides 10/24/2013 45  0 - 149 mg/dL Final  . HDL 14/78/2956 68  >39 mg/dL Final   Comment: According to ATP-III Guidelines, HDL-C >59 mg/dL is considered a                          negative risk factor for CHD.  Marland Kitchen VLDL Cholesterol Cal 10/24/2013 9  5 - 40 mg/dL Final  . LDL Calculated 10/24/2013 76  0 - 99 mg/dL Final  . Chol/HDL Ratio 10/24/2013 2.3  0.0 - 5.0 ratio units Final   Comment:                                   T. Chol/HDL Ratio                                                                      Men  Women  1/2 Avg.Risk  3.4    3.3                                                            Avg.Risk  5.0    4.4                                                         2X Avg.Risk  9.6    7.1                                                         3X Avg.Risk 23.4   11.0         Assessment & Plan:  Pain in joint, ankle and foot, right: improving  ADD (attention deficit disorder); stable  Unspecified essential hypertension: mild elevation of SBP  Other and unspecified hyperlipidemia:controlled  Lumbago: improved  Irritable bowel syndrome (IBS) - Plan: hyoscyamine (LEVSIN, ANASPAZ) 0.125 MG tablet  Pain in joint, shoulder region, right: observe. Refer to ortho when he desires  Tympanic membrane disorder, right: try antihistamine with pseudoehedrine

## 2013-10-29 NOTE — Patient Instructions (Signed)
Continue current medications. 

## 2013-11-24 ENCOUNTER — Other Ambulatory Visit: Payer: Self-pay | Admitting: *Deleted

## 2013-11-24 MED ORDER — AMPHETAMINE-DEXTROAMPHET ER 10 MG PO CP24: ORAL_CAPSULE | ORAL | Status: DC

## 2013-12-10 ENCOUNTER — Encounter: Payer: Self-pay | Admitting: Nurse Practitioner

## 2013-12-10 ENCOUNTER — Ambulatory Visit (INDEPENDENT_AMBULATORY_CARE_PROVIDER_SITE_OTHER): Payer: BC Managed Care – PPO | Admitting: Nurse Practitioner

## 2013-12-10 VITALS — BP 144/92 | HR 86 | Temp 98.5°F | Resp 12 | Wt 176.0 lb

## 2013-12-10 DIAGNOSIS — K5732 Diverticulitis of large intestine without perforation or abscess without bleeding: Secondary | ICD-10-CM

## 2013-12-10 MED ORDER — CIPROFLOXACIN HCL 500 MG PO TABS: 500.0000 mg | ORAL_TABLET | Freq: Two times a day (BID) | ORAL | Status: DC

## 2013-12-10 MED ORDER — HYDROCODONE-ACETAMINOPHEN 5-325 MG PO TABS: 1.0000 | ORAL_TABLET | Freq: Four times a day (QID) | ORAL | Status: DC | PRN

## 2013-12-10 MED ORDER — METRONIDAZOLE 500 MG PO TABS: 500.0000 mg | ORAL_TABLET | Freq: Three times a day (TID) | ORAL | Status: DC

## 2013-12-10 NOTE — Patient Instructions (Signed)
Diverticulitis °A diverticulum is a small pouch or sac on the colon. Diverticulosis is the presence of these diverticula on the colon. Diverticulitis is the irritation (inflammation) or infection of diverticula. °CAUSES  °The colon and its diverticula contain bacteria. If food particles block the tiny opening to a diverticulum, the bacteria inside can grow and cause an increase in pressure. This leads to infection and inflammation and is called diverticulitis. °SYMPTOMS  °· Abdominal pain and tenderness. Usually, the pain is located on the left side of your abdomen. However, it could be located elsewhere. °· Fever. °· Bloating. °· Feeling sick to your stomach (nausea). °· Throwing up (vomiting). °· Abnormal stools. °DIAGNOSIS  °Your caregiver will take a history and perform a physical exam. Since many things can cause abdominal pain, other tests may be necessary. Tests may include: °· Blood tests. °· Urine tests. °· X-ray of the abdomen. °· CT scan of the abdomen. °Sometimes, surgery is needed to determine if diverticulitis or other conditions are causing your symptoms. °TREATMENT  °Most of the time, you can be treated without surgery. Treatment includes: °· Resting the bowels by only having liquids for a few days. As you improve, you will need to eat a low-fiber diet. °· Intravenous (IV) fluids if you are losing body fluids (dehydrated). °· Antibiotic medicines that treat infections may be given. °· Pain and nausea medicine, if needed. °· Surgery if the inflamed diverticulum has burst. °HOME CARE INSTRUCTIONS  °· Try a clear liquid diet (broth, tea, or water for as long as directed by your caregiver). You may then gradually begin a low-fiber diet as tolerated.  °A low-fiber diet is a diet with less than 10 grams of fiber. Choose the foods below to reduce fiber in the diet: °· White breads, cereals, rice, and pasta. °· Cooked fruits and vegetables or soft fresh fruits and vegetables without the skin. °· Ground or  well-cooked tender beef, ham, veal, lamb, pork, or poultry. °· Eggs and seafood. °· After your diverticulitis symptoms have improved, your caregiver may put you on a high-fiber diet. A high-fiber diet includes 14 grams of fiber for every 1000 calories consumed. For a standard 2000 calorie diet, you would need 28 grams of fiber. Follow these diet guidelines to help you increase the fiber in your diet. It is important to slowly increase the amount fiber in your diet to avoid gas, constipation, and bloating. °· Choose whole-grain breads, cereals, pasta, and brown rice. °· Choose fresh fruits and vegetables with the skin on. Do not overcook vegetables because the more vegetables are cooked, the more fiber is lost. °· Choose more nuts, seeds, legumes, dried peas, beans, and lentils. °· Look for food products that have greater than 3 grams of fiber per serving on the Nutrition Facts label. °· Take all medicine as directed by your caregiver. °· If your caregiver has given you a follow-up appointment, it is very important that you go. Not going could result in lasting (chronic) or permanent injury, pain, and disability. If there is any problem keeping the appointment, call to reschedule. °SEEK MEDICAL CARE IF:  °· Your pain does not improve. °· You have a hard time advancing your diet beyond clear liquids. °· Your bowel movements do not return to normal. °SEEK IMMEDIATE MEDICAL CARE IF:  °· Your pain becomes worse. °· You have an oral temperature above 102° F (38.9° C), not controlled by medicine. °· You have repeated vomiting. °· You have bloody or black, tarry stools. °·   Symptoms that brought you to your caregiver become worse or are not getting better. °MAKE SURE YOU:  °· Understand these instructions. °· Will watch your condition. °· Will get help right away if you are not doing well or get worse. °Document Released: 09/20/2005 Document Revised: 03/04/2012 Document Reviewed: 01/16/2011 °[employer]® Patient Information  ©2014 [employer]. ° °

## 2013-12-10 NOTE — Progress Notes (Signed)
Patient ID: Connor [name], male   DOB: 01-08-1956, 57 y.o.   MRN: [id number]    Allergies  Allergen Reactions  . Amitriptyline   . Zolpidem Tartrate Other (See Comments)    Severe headache  . Amoxicillin-Pot Clavulanate Rash  . Propoxyphene Hcl Nausea Only    Chief Complaint  Patient presents with  . Diverticulitis    ? Diverticulitis, left lower abdominal pain since yesterday     HPI: Patient is a 57 y.o. male seen in the office today for diverticular flare Was having episodes of diverticulitis 4 times a year; this year he has had 3; left lower abdominal pain and tenderness, no diarrhea or blood in stools, increase in BM but not watery, yesterday had IBS episode but starting having stabbing pain, 99.2 temp this morning.  Still able to eat and drink without difficulty; no N/V Review of Systems:  Review of Systems  Constitutional: Positive for fever and malaise/fatigue.  Respiratory: Negative for shortness of breath.   Cardiovascular: Negative for chest pain.  Gastrointestinal: Positive for abdominal pain. Negative for nausea, vomiting, diarrhea and constipation.  Genitourinary: Negative for dysuria, urgency and frequency.  Musculoskeletal: Negative.   Skin: Negative.      Past Medical History  Diagnosis Date  . Inguinal hernia 11/2011    right  . Arthritis     shoulders and hips  . Anxiety   . Sinus infection 11/2011    started antibiotic 12/18/2011 x 7 days; current cough  . High cholesterol   . ADD (attention deficit disorder)   . Eczema 11/2011    right hand  . Irritable bowel syndrome (IBS)   . Headache(784.0)     tension or sinus  . Other disorders of vitreous   . Poisoning and toxic reactions caused by other specified animals and plants   . Other atopic dermatitis and related conditions   . Inguinal hernia without mention of obstruction or gangrene, unilateral or unspecified, (not specified as recurrent)   . Routine general medical examination at a health  care facility   . Internal hemorrhoids without mention of complication   . Abdominal pain, left lower quadrant   . Other specified visual disturbances   . Pain in joint, pelvic region and thigh   . Special screening for malignant neoplasm of prostate   . Recent retinal detachment, partial, with single defect   . Attention deficit disorder without mention of hyperactivity   . Encounter for long-term (current) use of other medications   . Alcohol abuse, unspecified   . Obesity, unspecified   . Unspecified essential hypertension   . Atrial fibrillation   . Edema   . Major depressive disorder, recurrent episode, severe, without mention of psychotic behavior   . Depressive disorder, not elsewhere classified   . Actinic keratosis   . Sciatica   . Other and unspecified hyperlipidemia   . Other seborrheic keratosis   . Anxiety state, unspecified   . Tension headache   . Insomnia, unspecified   . Pathologic fracture of vertebrae   . Lumbago   . Cervicalgia   . Type II or unspecified type diabetes mellitus without mention of complication, not stated as uncontrolled   . Pain in joint, site unspecified   . Irritable bowel syndrome    Past Surgical History  Procedure Laterality Date  . Nasal sinus surgery  12/2010  . Cataract extraction  2011    right eye  . Appendectomy  1987  . Hernia repair  05/03/2011  left  . Retinal detachment repair w/ scleral buckle le  07/22/2008    right eye; pars plana vitrectomy  . Inguinal hernia repair  12/28/2011    Procedure: HERNIA REPAIR INGUINAL ADULT;  Surgeon: Emelia Loron, MD;  Location: [facility];  Service: General;  Laterality: Right;  . Tonsillectomy and adenoidectomy  1964  . Removed anal warts  1976    Dr Terri Piedra   . Nasal polyp surgery  01/05/2011    DR. NEWMAN    Social History:   reports that he quit smoking about 18 years ago. His smoking use included Cigarettes. He smoked 0.00 packs per day. He has never used  smokeless tobacco. He reports that he drinks alcohol. He reports that he does not use illicit drugs.  Family History  Problem Relation Age of Onset  . Stroke Father   . Cancer Brother     prostate  . Cancer Paternal Grandfather     colon    Medications: Patient's Medications  New Prescriptions   No medications on file  Previous Medications   AMPHETAMINE-DEXTROAMPHETAMINE (ADDERALL XR) 10 MG 24 HR CAPSULE    Take one tablet once a day for nerves   BUTALBITAL-ACETAMINOPHEN-CAFFEINE (FIORICET) 50-325-40 MG PER TABLET    Take one tablet four times a day as needed for headaches   CLONAZEPAM (KLONOPIN) 0.5 MG TABLET    Take 0.5 mg by mouth 3 (three) times daily as needed.    DICYCLOMINE (BENTYL) 20 MG TABLET    Take 20 mg by mouth as needed.    FLUTICASONE (FLONASE) 50 MCG/ACT NASAL SPRAY    Place 2 sprays into the nose daily.   HALOBETASOL (ULTRAVATE) 0.05 % CREAM    Apply topically 2 (two) times daily. Apply to rash up to three times a day   HYOSCYAMINE (LEVSIN, ANASPAZ) 0.125 MG TABLET    One every 4 hours if needed for abdominal distress   LOVASTATIN (MEVACOR) 20 MG TABLET    take 1 tablet by mouth at bedtime to CONTROL cholesterol   MULTIPLE VITAMIN (MULTIVITAMIN) TABLET    Take 1 tablet by mouth daily.     NAPROXEN SODIUM (ANAPROX) 220 MG TABLET    Take 220 mg by mouth 2 (two) times daily with a meal.    Modified Medications   No medications on file  Discontinued Medications   No medications on file     Physical Exam:  Filed Vitals:   12/10/13 1448  BP: 144/92  Pulse: 86  Temp: 98.5 F (36.9 C)  TempSrc: Oral  Resp: 12  Weight: 176 lb (79.833 kg)  SpO2: 89%   Physical Exam  Constitutional: He is oriented to person, place, and time and well-developed, well-nourished, and in no distress.  HENT:  Head: Normocephalic and atraumatic.  Cardiovascular: Normal rate, regular rhythm and normal heart sounds.   Pulmonary/Chest: Effort normal and breath sounds normal.    Abdominal: Soft. Bowel sounds are normal. He exhibits no distension. There is tenderness (to left upper abdomen ).  Musculoskeletal: He exhibits no edema and no tenderness.  Neurological: He is alert and oriented to person, place, and time.  Skin: Skin is warm and dry. He is not diaphoretic.  Psychiatric: Affect normal.     Labs reviewed: Basic Metabolic Panel:  Recent Labs  21/30/86 1153 10/24/13 0808  NA 143 141  K 4.8 4.6  CL 105 100  CO2 24 23  GLUCOSE 104* 93  BUN 11 12  CREATININE 0.92 0.96  CALCIUM 9.0 8.9   Liver Function Tests:  Recent Labs  04/25/13 1153 10/24/13 0808  AST 18 22  ALT 35 26  ALKPHOS 55 67  BILITOT 0.2 0.2  PROT 6.4 6.0   No results found for this basename: LIPASE, AMYLASE,  in the last 8760 hours No results found for this basename: AMMONIA,  in the last 8760 hours CBC:  Recent Labs  04/25/13 1153  WBC 7.0  NEUTROABS 3.5  HGB 14.2  HCT 41.9  MCV 93   Lipid Panel:  Recent Labs  04/25/13 1153 10/24/13 0808  HDL 65 68  LDLCALC 76 76  TRIG 66 45  CHOLHDL 2.4 2.3     Assessment/Plan 1. Diverticulitis of colon (without mention of hemorrhage) - metroNIDAZOLE (FLAGYL) 500 MG tablet; Take 1 tablet (500 mg total) by mouth 3 (three) times daily.  Dispense: 30 tablet; Refill: 0 - ciprofloxacin (CIPRO) 500 MG tablet; Take 1 tablet (500 mg total) by mouth 2 (two) times daily.  Dispense: 20 tablet; Refill: 0 - HYDROcodone-acetaminophen (NORCO/VICODIN) 5-325 MG per tablet; Take 1 tablet by mouth every 6 (six) hours as needed.  Dispense: 20 tablet; Refill: 0 short term for acute flare -pt aware of when to seek follow up and emergency care

## 2013-12-24 ENCOUNTER — Other Ambulatory Visit: Payer: Self-pay | Admitting: *Deleted

## 2013-12-24 MED ORDER — AMPHETAMINE-DEXTROAMPHET ER 10 MG PO CP24: ORAL_CAPSULE | ORAL | Status: DC

## 2014-01-23 ENCOUNTER — Other Ambulatory Visit: Payer: Self-pay | Admitting: *Deleted

## 2014-01-23 MED ORDER — AMPHETAMINE-DEXTROAMPHET ER 10 MG PO CP24: ORAL_CAPSULE | ORAL | Status: DC

## 2014-02-23 ENCOUNTER — Other Ambulatory Visit: Payer: Self-pay | Admitting: *Deleted

## 2014-02-23 MED ORDER — AMPHETAMINE-DEXTROAMPHET ER 10 MG PO CP24: ORAL_CAPSULE | ORAL | Status: DC

## 2014-03-25 ENCOUNTER — Other Ambulatory Visit: Payer: Self-pay | Admitting: *Deleted

## 2014-03-25 MED ORDER — AMPHETAMINE-DEXTROAMPHET ER 10 MG PO CP24: ORAL_CAPSULE | ORAL | Status: DC

## 2014-03-25 NOTE — Telephone Encounter (Signed)
Patient requested 

## 2014-04-15 ENCOUNTER — Encounter (INDEPENDENT_AMBULATORY_CARE_PROVIDER_SITE_OTHER): Payer: Self-pay | Admitting: General Surgery

## 2014-04-15 ENCOUNTER — Ambulatory Visit (INDEPENDENT_AMBULATORY_CARE_PROVIDER_SITE_OTHER): Payer: BC Managed Care – PPO | Admitting: General Surgery

## 2014-04-15 VITALS — BP 132/78 | HR 80 | Temp 97.1°F | Ht 70.0 in | Wt 180.4 lb

## 2014-04-15 DIAGNOSIS — K645 Perianal venous thrombosis: Secondary | ICD-10-CM

## 2014-04-15 HISTORY — PX: HEMORRHOID SURGERY: SHX153

## 2014-04-15 NOTE — Progress Notes (Signed)
Subjective:     Patient ID: Connor [name], male   DOB: 1956/10/29, 58 y.o.   MRN: [id number]  HPI Patient is well known to our practice and has a history of hemorrhoids. He also underwent inguinal hernia repair by Dr. Dwain SarnaWakefield in the past. He complains of a two-week history of a painful external hemorrhoid. He's had no drainage just some significant pain. He has been doing sitz baths for the past several days without much relief.  Review of Systems     Objective:   Physical Exam  Constitutional: He appears well-developed and well-nourished.  Cardiovascular: Normal rate and normal heart sounds.   Pulmonary/Chest: Effort normal and breath sounds normal.  Abdominal: Soft. He exhibits no distension. There is no tenderness.  Genitourinary:  Right-sided venous has large thrombosed external hemorrhoid. Full rectal exam was deferred due to pain. Area was prepped and local anesthetic was injected. Longitudinal incision was made and the large clot was removed. Further local was used to irrigate the wound. There was mild bleeding. A dressing was applied and he tolerated this well.       Assessment:     Thrombosed external hemorrhoid    Plan:     This was excised as above. Continue sitz baths the rest of this week. Continue dressing until the drainage stops. Return when necessary.

## 2014-04-24 ENCOUNTER — Other Ambulatory Visit: Payer: Self-pay | Admitting: *Deleted

## 2014-04-24 MED ORDER — AMPHETAMINE-DEXTROAMPHET ER 10 MG PO CP24: ORAL_CAPSULE | ORAL | Status: DC

## 2014-04-24 NOTE — Telephone Encounter (Signed)
Patient received refill on Adderall.

## 2014-05-05 ENCOUNTER — Ambulatory Visit (INDEPENDENT_AMBULATORY_CARE_PROVIDER_SITE_OTHER): Payer: BC Managed Care – PPO | Admitting: Internal Medicine

## 2014-05-05 ENCOUNTER — Encounter: Payer: Self-pay | Admitting: Internal Medicine

## 2014-05-05 VITALS — BP 162/100 | HR 78 | Temp 99.4°F | Resp 18 | Ht 70.0 in | Wt 183.2 lb

## 2014-05-05 DIAGNOSIS — F988 Other specified behavioral and emotional disorders with onset usually occurring in childhood and adolescence: Secondary | ICD-10-CM

## 2014-05-05 DIAGNOSIS — K645 Perianal venous thrombosis: Secondary | ICD-10-CM

## 2014-05-05 DIAGNOSIS — E785 Hyperlipidemia, unspecified: Secondary | ICD-10-CM

## 2014-05-05 DIAGNOSIS — I10 Essential (primary) hypertension: Secondary | ICD-10-CM

## 2014-05-05 DIAGNOSIS — K589 Irritable bowel syndrome without diarrhea: Secondary | ICD-10-CM

## 2014-05-05 MED ORDER — BUTALBITAL-APAP-CAFFEINE 50-325-40 MG PO TABS: ORAL_TABLET | ORAL | Status: DC

## 2014-05-05 MED ORDER — DICYCLOMINE HCL 20 MG PO TABS: 20.0000 mg | ORAL_TABLET | ORAL | Status: DC | PRN

## 2014-05-05 MED ORDER — CLONAZEPAM 0.5 MG PO TABS: 0.5000 mg | ORAL_TABLET | Freq: Three times a day (TID) | ORAL | Status: DC | PRN

## 2014-05-05 NOTE — Patient Instructions (Signed)
Continue current medications. 

## 2014-05-07 ENCOUNTER — Encounter: Payer: Self-pay | Admitting: Internal Medicine

## 2014-05-07 NOTE — Progress Notes (Signed)
Patient ID: Connor [name], male   DOB: 07-Sep-1956, 58 y.o.   MRN: [id number]    Location:  [facility]   Place of Service: OFFICE    Allergies  Allergen Reactions  . Amitriptyline   . Zolpidem Tartrate Other (See Comments)    Severe headache  . Amoxicillin-Pot Clavulanate Rash  . Propoxyphene Hcl Nausea Only    Chief Complaint  Patient presents with  . Follow-up    HPI:  Unspecified essential hypertension - moderately high today. He is feeling stressed by project at work. Says BP at home is normal.  ADD (attention deficit disorder): unchanged. Controlled.  Other and unspecified hyperlipidemia: followup next OV  Irritable bowel syndrome (IBS) - unchanged. Some loose stools. Chronic.  Thrombosed external hemorrhoid: recent problem that ultimately needed surgical extraction of the clot by Dr Violeta GelinasBurke Thompson, Gen Surg on 04/15/14. He is feeling much better.    Medications: Patient's Medications  New Prescriptions   No medications on file  Previous Medications   AMPHETAMINE-DEXTROAMPHETAMINE (ADDERALL XR) 10 MG 24 HR CAPSULE    Take one tablet once a day for nerves   FLUTICASONE (FLONASE) 50 MCG/ACT NASAL SPRAY    Place 2 sprays into the nose as needed.    HALOBETASOL (ULTRAVATE) 0.05 % CREAM    Apply topically 2 (two) times daily. Apply to rash up to three times a day   LOVASTATIN (MEVACOR) 20 MG TABLET    take 1 tablet by mouth at bedtime to CONTROL cholesterol   MULTIPLE VITAMIN (MULTIVITAMIN) TABLET    Take 1 tablet by mouth daily.    Modified Medications   Modified Medication Previous Medication   BUTALBITAL-ACETAMINOPHEN-CAFFEINE (FIORICET) 50-325-40 MG PER TABLET butalbital-acetaminophen-caffeine (FIORICET) 50-325-40 MG per tablet      Take one tablet four times a day as needed for headaches    Take one tablet four times a day as needed for headaches   CLONAZEPAM (KLONOPIN) 0.5 MG TABLET clonazePAM (KLONOPIN) 0.5 MG tablet      Take 1 tablet (0.5 mg total) by mouth 3  (three) times daily as needed.    Take 0.5 mg by mouth 3 (three) times daily as needed.    DICYCLOMINE (BENTYL) 20 MG TABLET dicyclomine (BENTYL) 20 MG tablet      Take 1 tablet (20 mg total) by mouth as needed.    Take 20 mg by mouth as needed.   Discontinued Medications   CIPROFLOXACIN (CIPRO) 500 MG TABLET    Take 1 tablet (500 mg total) by mouth 2 (two) times daily.     Review of Systems  Constitutional: Positive for fatigue.  HENT: Positive for ear pain (right ear discomfort).   Eyes: Positive for visual disturbance (corrective lenses).  Cardiovascular: Negative for chest pain, palpitations and leg swelling.  Gastrointestinal: Negative for nausea, abdominal pain, diarrhea, constipation and abdominal distention.       Intermittent abd spasm and discomfort that is linked to his IBS.   Endocrine: Negative.   Musculoskeletal: Positive for arthralgias.  Skin: Negative.   Allergic/Immunologic: Negative.   Neurological: Positive for headaches.  Hematological: Negative.   Psychiatric/Behavioral: Positive for decreased concentration. The patient is nervous/anxious.     Filed Vitals:   05/05/14 1607  BP: 162/100  Pulse: 78  Temp: 99.4 F (37.4 C)  TempSrc: Oral  Resp: 18  Height: 5\' 10"  (1.778 m)  Weight: 183 lb 3.2 oz (83.099 kg)  SpO2: 97%   Body mass index is 26.29 kg/(m^2).  Physical Exam  Constitutional: He is oriented to person, place, and time. He appears well-developed and well-nourished. No distress.  HENT:  Head: Normocephalic and atraumatic.  Right Ear: External ear normal.  Left Ear: External ear normal.  Nose: Nose normal.  Mouth/Throat: Oropharynx is clear and moist.  Retracted right TM. Some erythema superiorly  Eyes: Conjunctivae and EOM are normal. Pupils are equal, round, and reactive to light.  Neck: No JVD present. No tracheal deviation present. No thyromegaly present.  Cardiovascular: Normal rate, regular rhythm and intact distal pulses.  Exam reveals  no gallop and no friction rub.   No murmur heard. Pulmonary/Chest: No respiratory distress. He has no wheezes. He has no rales.  Abdominal: He exhibits no distension and no mass. There is no tenderness.  Musculoskeletal: Normal range of motion. He exhibits no edema and no tenderness.  Lymphadenopathy:    He has no cervical adenopathy.  Neurological: He is alert and oriented to person, place, and time. No cranial nerve deficit. Coordination normal.  Skin: No rash noted. No erythema. No pallor.  Psychiatric: He has a normal mood and affect. His behavior is normal. Judgment and thought content normal.     Labs reviewed: No visits with results within 3 Month(s) from this visit. Latest known visit with results is:  Appointment on 10/24/2013  Component Date Value Ref Range Status  . Glucose 10/24/2013 93  65 - 99 mg/dL Final  . BUN 04/54/0981 12  6 - 24 mg/dL Final  . Creatinine, Ser 10/24/2013 0.96  0.76 - 1.27 mg/dL Final  . GFR calc non Af Amer 10/24/2013 88  >59 mL/min/1.73 Final  . GFR calc Af Amer 10/24/2013 102  >59 mL/min/1.73 Final  . BUN/Creatinine Ratio 10/24/2013 13  9 - 20 Final  . Sodium 10/24/2013 141  134 - 144 mmol/L Final  . Potassium 10/24/2013 4.6  3.5 - 5.2 mmol/L Final  . Chloride 10/24/2013 100  97 - 108 mmol/L Final  . CO2 10/24/2013 23  18 - 29 mmol/L Final  . Calcium 10/24/2013 8.9  8.7 - 10.2 mg/dL Final  . Total Protein 10/24/2013 6.0  6.0 - 8.5 g/dL Final  . Albumin 19/14/7829 4.4  3.5 - 5.5 g/dL Final  . Globulin, Total 10/24/2013 1.6  1.5 - 4.5 g/dL Final  . Albumin/Globulin Ratio 10/24/2013 2.8* 1.1 - 2.5 Final  . Total Bilirubin 10/24/2013 0.2  0.0 - 1.2 mg/dL Final  . Alkaline Phosphatase 10/24/2013 67  39 - 117 IU/L Final  . AST 10/24/2013 22  0 - 40 IU/L Final  . ALT 10/24/2013 26  0 - 44 IU/L Final  . Cholesterol, Total 10/24/2013 153  100 - 199 mg/dL Final  . Triglycerides 10/24/2013 45  0 - 149 mg/dL Final  . HDL 56/21/3086 68  >39 mg/dL Final     Comment: According to ATP-III Guidelines, HDL-C >59 mg/dL is considered a                          negative risk factor for CHD.  Marland Kitchen VLDL Cholesterol Cal 10/24/2013 9  5 - 40 mg/dL Final  . LDL Calculated 10/24/2013 76  0 - 99 mg/dL Final  . Chol/HDL Ratio 10/24/2013 2.3  0.0 - 5.0 ratio units Final   Comment:                                   T.  Chol/HDL Ratio                                                                      Men  Women                                                        1/2 Avg.Risk  3.4    3.3                                                            Avg.Risk  5.0    4.4                                                         2X Avg.Risk  9.6    7.1                                                         3X Avg.Risk 23.4   11.0      Assessment/Plan  Unspecified essential hypertension -continue to observe   Plan: CMP  ADD (attention deficit disorder): continue Adderall  Other and unspecified hyperlipidemia: lipids next OV  Irritable bowel syndrome (IBS) - observe. Bentyl as needed.  Thrombosed external hemorrhoid: better after surgery

## 2014-05-25 ENCOUNTER — Other Ambulatory Visit: Payer: Self-pay | Admitting: *Deleted

## 2014-05-25 HISTORY — PX: TYMPANOSTOMY TUBE PLACEMENT: SHX32

## 2014-05-25 MED ORDER — AMPHETAMINE-DEXTROAMPHET ER 10 MG PO CP24: ORAL_CAPSULE | ORAL | Status: DC

## 2014-05-25 NOTE — Telephone Encounter (Signed)
Patient Requested 

## 2014-06-22 ENCOUNTER — Other Ambulatory Visit: Payer: Self-pay | Admitting: Internal Medicine

## 2014-06-22 ENCOUNTER — Telehealth: Payer: Self-pay | Admitting: *Deleted

## 2014-06-24 ENCOUNTER — Other Ambulatory Visit: Payer: Self-pay | Admitting: *Deleted

## 2014-06-24 MED ORDER — AMPHETAMINE-DEXTROAMPHET ER 10 MG PO CP24: ORAL_CAPSULE | ORAL | Status: DC

## 2014-07-06 NOTE — Telephone Encounter (Signed)
Called patient LM to return call about medication pick up.

## 2014-07-13 DIAGNOSIS — I10 Essential (primary) hypertension: Secondary | ICD-10-CM | POA: Insufficient documentation

## 2014-07-13 DIAGNOSIS — G44221 Chronic tension-type headache, intractable: Secondary | ICD-10-CM | POA: Insufficient documentation

## 2014-07-13 DIAGNOSIS — J309 Allergic rhinitis, unspecified: Secondary | ICD-10-CM | POA: Insufficient documentation

## 2014-07-13 DIAGNOSIS — F5102 Adjustment insomnia: Secondary | ICD-10-CM | POA: Insufficient documentation

## 2014-07-13 DIAGNOSIS — F4322 Adjustment disorder with anxiety: Secondary | ICD-10-CM | POA: Insufficient documentation

## 2014-07-24 ENCOUNTER — Other Ambulatory Visit: Payer: Self-pay | Admitting: *Deleted

## 2014-07-24 MED ORDER — AMPHETAMINE-DEXTROAMPHET ER 10 MG PO CP24: ORAL_CAPSULE | ORAL | Status: DC

## 2014-07-24 NOTE — Telephone Encounter (Signed)
He will pick it up after 1:00 pm and if ready before that call him [phone].

## 2014-08-20 ENCOUNTER — Other Ambulatory Visit: Payer: Self-pay | Admitting: *Deleted

## 2014-08-20 MED ORDER — LOVASTATIN 20 MG PO TABS: ORAL_TABLET | ORAL | Status: DC

## 2014-08-20 NOTE — Telephone Encounter (Signed)
[facility] [address]

## 2014-08-24 ENCOUNTER — Other Ambulatory Visit: Payer: Self-pay | Admitting: *Deleted

## 2014-08-24 MED ORDER — AMPHETAMINE-DEXTROAMPHET ER 10 MG PO CP24: ORAL_CAPSULE | ORAL | Status: DC

## 2014-09-23 ENCOUNTER — Other Ambulatory Visit: Payer: Self-pay | Admitting: *Deleted

## 2014-09-23 MED ORDER — AMPHETAMINE-DEXTROAMPHET ER 10 MG PO CP24: ORAL_CAPSULE | ORAL | Status: DC

## 2014-09-23 NOTE — Telephone Encounter (Signed)
Patient Requested and will pick up 

## 2014-10-23 ENCOUNTER — Other Ambulatory Visit: Payer: Self-pay | Admitting: *Deleted

## 2014-10-23 MED ORDER — AMPHETAMINE-DEXTROAMPHET ER 10 MG PO CP24: ORAL_CAPSULE | ORAL | Status: DC

## 2014-10-23 NOTE — Telephone Encounter (Signed)
Patient Requested and will pick up 

## 2014-11-06 ENCOUNTER — Other Ambulatory Visit: Payer: BC Managed Care – PPO

## 2014-11-06 DIAGNOSIS — K589 Irritable bowel syndrome without diarrhea: Secondary | ICD-10-CM

## 2014-11-06 DIAGNOSIS — I10 Essential (primary) hypertension: Secondary | ICD-10-CM

## 2014-11-07 LAB — COMPREHENSIVE METABOLIC PANEL
A/G RATIO: 2.3 (ref 1.1–2.5)
ALT: 25 IU/L (ref 0–44)
AST: 16 IU/L (ref 0–40)
Albumin: 4.3 g/dL (ref 3.5–5.5)
Alkaline Phosphatase: 61 IU/L (ref 39–117)
BUN/Creatinine Ratio: 14 (ref 9–20)
BUN: 13 mg/dL (ref 6–24)
CHLORIDE: 100 mmol/L (ref 97–108)
CO2: 24 mmol/L (ref 18–29)
Calcium: 8.8 mg/dL (ref 8.7–10.2)
Creatinine, Ser: 0.95 mg/dL (ref 0.76–1.27)
GFR calc Af Amer: 102 mL/min/{1.73_m2} (ref >59)
GFR, EST NON AFRICAN AMERICAN: 88 mL/min/{1.73_m2} (ref >59)
Globulin, Total: 1.9 g/dL (ref 1.5–4.5)
Glucose: 103 mg/dL — ABNORMAL HIGH (ref 65–99)
POTASSIUM: 4.6 mmol/L (ref 3.5–5.2)
SODIUM: 139 mmol/L (ref 134–144)
TOTAL PROTEIN: 6.2 g/dL (ref 6.0–8.5)
Total Bilirubin: 0.2 mg/dL (ref 0.0–1.2)

## 2014-11-07 LAB — LIPID PANEL
CHOLESTEROL TOTAL: 186 mg/dL (ref 100–199)
Chol/HDL Ratio: 2.5 ratio units (ref 0.0–5.0)
HDL: 75 mg/dL (ref >39)
LDL Calculated: 100 mg/dL — ABNORMAL HIGH (ref 0–99)
Triglycerides: 54 mg/dL (ref 0–149)
VLDL CHOLESTEROL CAL: 11 mg/dL (ref 5–40)

## 2014-11-09 ENCOUNTER — Other Ambulatory Visit: Payer: Self-pay

## 2014-11-11 ENCOUNTER — Ambulatory Visit (INDEPENDENT_AMBULATORY_CARE_PROVIDER_SITE_OTHER): Payer: BC Managed Care – PPO | Admitting: Internal Medicine

## 2014-11-11 ENCOUNTER — Encounter: Payer: Self-pay | Admitting: Internal Medicine

## 2014-11-11 VITALS — BP 152/88 | HR 81 | Temp 98.0°F | Resp 10 | Ht 70.0 in | Wt 180.0 lb

## 2014-11-11 DIAGNOSIS — J302 Other seasonal allergic rhinitis: Secondary | ICD-10-CM

## 2014-11-11 DIAGNOSIS — M545 Low back pain, unspecified: Secondary | ICD-10-CM

## 2014-11-11 DIAGNOSIS — I10 Essential (primary) hypertension: Secondary | ICD-10-CM

## 2014-11-11 DIAGNOSIS — K645 Perianal venous thrombosis: Secondary | ICD-10-CM

## 2014-11-11 DIAGNOSIS — K589 Irritable bowel syndrome without diarrhea: Secondary | ICD-10-CM

## 2014-11-11 DIAGNOSIS — F909 Attention-deficit hyperactivity disorder, unspecified type: Secondary | ICD-10-CM

## 2014-11-11 DIAGNOSIS — F988 Other specified behavioral and emotional disorders with onset usually occurring in childhood and adolescence: Secondary | ICD-10-CM

## 2014-11-11 DIAGNOSIS — E785 Hyperlipidemia, unspecified: Secondary | ICD-10-CM

## 2014-11-11 NOTE — Progress Notes (Signed)
Patient ID: Connor [name], male   DOB: 12-Oct-1956, 58 y.o.   MRN: [id number]    HISTORY AND PHYSICAL  Location:    [facility]   Place of Service:   OFFICE  Extended Emergency Contact Information Primary Emergency Contact: Devers,Lynn Address: [address]          [place], [place] [address] [place] of [place] Home Phone: [phone] Mobile Phone: [phone] Relation: Spouse   Chief Complaint  Patient presents with  . Annual Exam    Yearly check-up, discuss labs (copy printed)     HPI:  Recent sinus infection. Seen in ER. Given antibiotic  Essential hypertension - mild elevation of SBP  ADD (attention deficit disorder): controlled  HLD (hyperlipidemia): controlled  Irritable bowel syndrome (IBS): controlled  Midline low back pain without sciatica: mild  Thrombosed external hemorrhoid: present about 3 weeks. He will call surgeon  Other seasonal allergic rhinitis: using antihistamine    Past Medical History  Diagnosis Date  . Inguinal hernia 11/2011    right  . Arthritis     shoulders and hips  . Anxiety   . Sinus infection 11/2011    started antibiotic 12/18/2011 x 7 days; current cough  . High cholesterol   . ADD (attention deficit disorder)   . Eczema 11/2011    right hand  . Irritable bowel syndrome (IBS)   . Headache(784.0)     tension or sinus  . Other disorders of vitreous   . Poisoning and toxic reactions caused by other specified animals and plants   . Other atopic dermatitis and related conditions   . Inguinal hernia without mention of obstruction or gangrene, unilateral or unspecified, (not specified as recurrent)   . Routine general medical examination at a health care facility   . Internal hemorrhoids without mention of complication   . Abdominal pain, left lower quadrant   . Other specified visual disturbances   . Pain in joint, pelvic region and thigh   . Special screening for malignant neoplasm of prostate   . Recent retinal  detachment, partial, with single defect   . Attention deficit disorder without mention of hyperactivity   . Encounter for long-term (current) use of other medications   . Alcohol abuse, unspecified   . Obesity, unspecified   . Unspecified essential hypertension   . Atrial fibrillation   . Edema   . Major depressive disorder, recurrent episode, severe, without mention of psychotic behavior   . Depressive disorder, not elsewhere classified   . Actinic keratosis   . Sciatica   . Other and unspecified hyperlipidemia   . Other seborrheic keratosis   . Anxiety state, unspecified   . Tension headache   . Insomnia, unspecified   . Pathologic fracture of vertebrae   . Lumbago   . Cervicalgia   . Type II or unspecified type diabetes mellitus without mention of complication, not stated as uncontrolled   . Pain in joint, site unspecified   . Irritable bowel syndrome     Past Surgical History  Procedure Laterality Date  . Nasal sinus surgery  12/2010  . Cataract extraction  2011    right eye  . Appendectomy  1987  . Hernia repair  05/03/2011    left  . Retinal detachment repair w/ scleral buckle le  07/22/2008    right eye; pars plana vitrectomy  . Inguinal hernia repair  12/28/2011    Procedure: HERNIA REPAIR INGUINAL ADULT;  Surgeon: Emelia Loron, MD;  Location: [facility];  Service: General;  Laterality: Right;  . Tonsillectomy and adenoidectomy  1964  . Removed anal warts  1976    Dr Terri PiedraLupton   . Nasal polyp surgery  01/05/2011    DR. NEWMAN   . Hemorrhoid surgery  04/15/14    thrombosed int. Hemorrhoid. Dr. Violeta GelinasBurke Thompson  . Tympanostomy tube placement  June 2015    [facility]    Patient Care Team: Kimber RelicArthur G Green, MD as PCP - General (Internal Medicine) Drema Halonhristopher E Newman, MD as Consulting Physician (Otolaryngology) Violeta GelinasBurke Thompson, MD as Consulting Physician (General Surgery) Marcelyn BruinsMark T. Nile RiggsShapiro, MD as Consulting Physician (Ophthalmology) Meryl DareMalcolm T Stark, MD  as Consulting Physician (Gastroenterology) Emelia LoronMatthew Wakefield, MD as Consulting Physician (General Surgery) Mertha FindersJohn H Hall Jr., MD (Dermatology)  History   Social History  . Marital Status: Married    Spouse Name: N/A    Number of Children: N/A  . Years of Education: N/A   Occupational History  . Not on file.   Social History Main Topics  . Smoking status: Former Smoker    Types: Cigarettes    Quit date: 05/29/1997  . Smokeless tobacco: Never Used  . Alcohol Use: 0.0 oz/week    0 Not specified per week     Comment: 1-3 beers/day  . Drug Use: No  . Sexual Activity:    Partners: Female     Comment: Wife   Other Topics Concern  . Not on file   Social History Narrative     reports that he quit smoking about 17 years ago. His smoking use included Cigarettes. He smoked 0.00 packs per day. He has never used smokeless tobacco. He reports that he drinks alcohol. He reports that he does not use illicit drugs.  Family History  Problem Relation Age of Onset  . Stroke Father   . Cancer Brother     prostate  . Cancer Paternal Grandfather     colon   Family Status  Relation Status Death Age  . Father Deceased     aneurysm  . Brother Alive   . Mother Alive   . Son Alive     Immunization History  Administered Date(s) Administered  . DTaP 05/31/2006  . Influenza-Unspecified 10/06/2013, 10/06/2014  . Tdap 10/25/2013    Allergies  Allergen Reactions  . Amitriptyline   . Zolpidem Tartrate Other (See Comments)    Severe headache  . Amoxicillin-Pot Clavulanate Rash  . Propoxyphene Hcl Nausea Only    Medications: Patient's Medications  New Prescriptions   No medications on file  Previous Medications   AMPHETAMINE-DEXTROAMPHETAMINE (ADDERALL XR) 10 MG 24 HR CAPSULE    Take one tablet once a day for nerves   BUTALBITAL-ACETAMINOPHEN-CAFFEINE (FIORICET) 50-325-40 MG PER TABLET    Take one tablet four times a day as needed for headaches   CLONAZEPAM (KLONOPIN) 0.5 MG  TABLET    take 1 tablet by mouth three times a day if needed   DICYCLOMINE (BENTYL) 20 MG TABLET    Take 1 tablet (20 mg total) by mouth as needed.   FLUTICASONE (FLONASE) 50 MCG/ACT NASAL SPRAY    Place 2 sprays into the nose as needed.    HALOBETASOL (ULTRAVATE) 0.05 % CREAM    Apply topically 2 (two) times daily. Apply to rash up to three times a day   LOVASTATIN (MEVACOR) 20 MG TABLET    Take one tablet by mouth at bedtime to control cholesterol   MULTIPLE VITAMIN (MULTIVITAMIN) TABLET  Take 1 tablet by mouth daily.    Modified Medications   No medications on file  Discontinued Medications   No medications on file    Review of Systems  Constitutional: Positive for fatigue.  HENT: Negative for ear pain.        Recurrent sinus congestion. Has seen Dr. Narda Bondshris Newman several timeS for SINUS infection. Had right tympanostomy tube in July 2015.  Eyes: Positive for visual disturbance (corrective lenses).  Cardiovascular: Negative for chest pain, palpitations and leg swelling.  Gastrointestinal: Negative for nausea, abdominal pain, diarrhea, constipation and abdominal distention.       Intermittent abd spasm and discomfort that is linked to his IBS.   Endocrine: Negative.   Musculoskeletal: Positive for arthralgias.  Skin: Negative.   Allergic/Immunologic: Negative.   Neurological: Positive for headaches.  Hematological: Negative.   Psychiatric/Behavioral: Positive for decreased concentration (Hx ADD. Currentl undeer contro with Adderall.). The patient is nervous/anxious.     Filed Vitals:   11/11/14 1442  BP: 152/88  Pulse: 81  Temp: 98 F (36.7 C)  TempSrc: Oral  Resp: 10  Height: 5\' 10"  (1.778 m)  Weight: 180 lb (81.647 kg)  SpO2: 99%   Body mass index is 25.83 kg/(m^2).  Physical Exam  Constitutional: He is oriented to person, place, and time. He appears well-developed and well-nourished. No distress.  HENT:  Head: Normocephalic and atraumatic.  Right Ear: External  ear normal.  Left Ear: External ear normal.  Nose: Nose normal.  Mouth/Throat: Oropharynx is clear and moist.  Blue tympanostomy tube right EAC  Eyes: Conjunctivae and EOM are normal. Pupils are equal, round, and reactive to light.  Neck: No JVD present. No tracheal deviation present. No thyromegaly present.  Cardiovascular: Normal rate, regular rhythm and intact distal pulses.  Exam reveals no gallop and no friction rub.   No murmur heard. Pulmonary/Chest: No respiratory distress. He has no wheezes. He has no rales.  Abdominal: He exhibits no distension and no mass. There is no tenderness.  Genitourinary:  Pencil thickness thrombosed hemorrhoid at 8 o'clock. Tender.  Musculoskeletal: Normal range of motion. He exhibits no edema or tenderness.  Lymphadenopathy:    He has no cervical adenopathy.  Neurological: He is alert and oriented to person, place, and time. No cranial nerve deficit. Coordination normal.  Skin: No rash noted. No erythema. No pallor.  Psychiatric: He has a normal mood and affect. His behavior is normal. Judgment and thought content normal.     Labs reviewed: Appointment on 11/06/2014  Component Date Value Ref Range Status  . Glucose 11/06/2014 103* 65 - 99 mg/dL Final  . BUN 74/25/956311/13/2015 13  6 - 24 mg/dL Final  . Creatinine, Ser 11/06/2014 0.95  0.76 - 1.27 mg/dL Final  . GFR calc non Af Amer 11/06/2014 88  >59 mL/min/1.73 Final  . GFR calc Af Amer 11/06/2014 102  >59 mL/min/1.73 Final  . BUN/Creatinine Ratio 11/06/2014 14  9 - 20 Final  . Sodium 11/06/2014 139  134 - 144 mmol/L Final  . Potassium 11/06/2014 4.6  3.5 - 5.2 mmol/L Final  . Chloride 11/06/2014 100  97 - 108 mmol/L Final  . CO2 11/06/2014 24  18 - 29 mmol/L Final  . Calcium 11/06/2014 8.8  8.7 - 10.2 mg/dL Final  . Total Protein 11/06/2014 6.2  6.0 - 8.5 g/dL Final  . Albumin 87/56/433211/13/2015 4.3  3.5 - 5.5 g/dL Final  . Globulin, Total 11/06/2014 1.9  1.5 - 4.5 g/dL Final  .  Albumin/Globulin Ratio  11/06/2014 2.3  1.1 - 2.5 Final  . Total Bilirubin 11/06/2014 0.2  0.0 - 1.2 mg/dL Final  . Alkaline Phosphatase 11/06/2014 61  39 - 117 IU/L Final  . AST 11/06/2014 16  0 - 40 IU/L Final  . ALT 11/06/2014 25  0 - 44 IU/L Final  . Cholesterol, Total 11/06/2014 186  100 - 199 mg/dL Final  . Triglycerides 11/06/2014 54  0 - 149 mg/dL Final  . HDL 16/09/9603 75  >39 mg/dL Final   Comment: According to ATP-III Guidelines, HDL-C >59 mg/dL is considered a negative risk factor for CHD.   Marland Kitchen VLDL Cholesterol Cal 11/06/2014 11  5 - 40 mg/dL Final  . LDL Calculated 11/06/2014 540* 0 - 99 mg/dL Final  . Chol/HDL Ratio 11/06/2014 2.5  0.0 - 5.0 ratio units Final   Comment:                                   T. Chol/HDL Ratio                                             Men  Women                               1/2 Avg.Risk  3.4    3.3                                   Avg.Risk  5.0    4.4                                2X Avg.Risk  9.6    7.1                                3X Avg.Risk 23.4   11.0     11/11/14 EKG: rate 78. NSR. Normal.   Assessment/Plan 1. Essential hypertension mimld elevation of SBP. - EKG 12-Lead  2. Benign essential HTN  3. ADD (attention deficit disorder) controlled  4. HLD (hyperlipidemia) controlled  5. Irritable bowel syndrome (IBS) controlled  6. Midline low back pain without sciatica mild  7. Hyperlipidemia controlled  8. Thrombosed external hemorrhoid See surgeon. He will call.  9. Other seasonal allergic rhinitis Continue antihistamines as needed.

## 2014-11-23 ENCOUNTER — Other Ambulatory Visit: Payer: Self-pay | Admitting: *Deleted

## 2014-11-23 MED ORDER — AMPHETAMINE-DEXTROAMPHET ER 10 MG PO CP24: ORAL_CAPSULE | ORAL | Status: DC

## 2014-11-23 NOTE — Telephone Encounter (Signed)
Patient Requested and will pick up 

## 2014-12-14 ENCOUNTER — Ambulatory Visit (INDEPENDENT_AMBULATORY_CARE_PROVIDER_SITE_OTHER): Payer: BC Managed Care – PPO | Admitting: Nurse Practitioner

## 2014-12-14 ENCOUNTER — Encounter: Payer: Self-pay | Admitting: Nurse Practitioner

## 2014-12-14 VITALS — BP 138/84 | HR 63 | Temp 98.4°F | Resp 18 | Ht 70.0 in | Wt 184.4 lb

## 2014-12-14 DIAGNOSIS — K5730 Diverticulosis of large intestine without perforation or abscess without bleeding: Secondary | ICD-10-CM | POA: Insufficient documentation

## 2014-12-14 MED ORDER — METRONIDAZOLE 500 MG PO TABS: 500.0000 mg | ORAL_TABLET | Freq: Three times a day (TID) | ORAL | Status: DC

## 2014-12-14 MED ORDER — CIPROFLOXACIN HCL 500 MG PO TABS: 500.0000 mg | ORAL_TABLET | Freq: Two times a day (BID) | ORAL | Status: DC

## 2014-12-14 NOTE — Progress Notes (Signed)
Patient ID: Connor [name], male   DOB: 1956/05/29, 58 y.o.   MRN: [id number]    PCP: Kimber Relic, MD  Allergies  Allergen Reactions  . Amitriptyline   . Zolpidem Tartrate Other (See Comments)    Severe headache  . Amoxicillin-Pot Clavulanate Rash  . Propoxyphene Hcl Nausea Only    Chief Complaint  Patient presents with  . Medical Management of Chronic Issues    ? diverticulitis, tenderness and pain since 5 am this morning     HPI: Patient is a 58 y.o. male seen in the office today due to worsening abdominal pain. Hx of diverticulitis. Tenderness in lower abdomen not as bad as it has been in the past. No fevers. No changes in bowel habit. 2 normal bowel movements today. No other flares this year. Feels a little yucky today. No nausea or vomiting  Review of Systems:  Review of Systems  Constitutional: Negative for activity change, appetite change, fatigue and unexpected weight change.  HENT: Negative for congestion and hearing loss.   Eyes: Negative.   Respiratory: Negative for cough and shortness of breath.   Cardiovascular: Negative for chest pain, palpitations and leg swelling.  Gastrointestinal: Positive for abdominal pain. Negative for diarrhea and constipation.  Genitourinary: Negative for dysuria and difficulty urinating.  Musculoskeletal: Negative for myalgias and arthralgias.  Skin: Negative for color change and wound.  Neurological: Negative for dizziness and weakness.  Psychiatric/Behavioral: Negative for behavioral problems, confusion and agitation.    Past Medical History  Diagnosis Date  . Inguinal hernia 11/2011    right  . Arthritis     shoulders and hips  . Anxiety   . Sinus infection 11/2011    started antibiotic 12/18/2011 x 7 days; current cough  . High cholesterol   . ADD (attention deficit disorder)   . Eczema 11/2011    right hand  . Irritable bowel syndrome (IBS)   . Headache(784.0)     tension or sinus  . Other disorders of vitreous     . Poisoning and toxic reactions caused by other specified animals and plants   . Other atopic dermatitis and related conditions   . Inguinal hernia without mention of obstruction or gangrene, unilateral or unspecified, (not specified as recurrent)   . Routine general medical examination at a health care facility   . Internal hemorrhoids without mention of complication   . Abdominal pain, left lower quadrant   . Other specified visual disturbances   . Pain in joint, pelvic region and thigh   . Special screening for malignant neoplasm of prostate   . Recent retinal detachment, partial, with single defect   . Attention deficit disorder without mention of hyperactivity   . Encounter for long-term (current) use of other medications   . Alcohol abuse, unspecified   . Obesity, unspecified   . Unspecified essential hypertension   . Atrial fibrillation   . Edema   . Major depressive disorder, recurrent episode, severe, without mention of psychotic behavior   . Depressive disorder, not elsewhere classified   . Actinic keratosis   . Sciatica   . Other and unspecified hyperlipidemia   . Other seborrheic keratosis   . Anxiety state, unspecified   . Tension headache   . Insomnia, unspecified   . Pathologic fracture of vertebrae   . Lumbago   . Cervicalgia   . Type II or unspecified type diabetes mellitus without mention of complication, not stated as uncontrolled   . Pain in joint,  site unspecified   . Irritable bowel syndrome    Past Surgical History  Procedure Laterality Date  . Nasal sinus surgery  12/2010  . Cataract extraction  2011    right eye  . Appendectomy  1987  . Hernia repair  05/03/2011    left  . Retinal detachment repair w/ scleral buckle le  07/22/2008    right eye; pars plana vitrectomy  . Inguinal hernia repair  12/28/2011    Procedure: HERNIA REPAIR INGUINAL ADULT;  Surgeon: Emelia LoronMatthew Wakefield, MD;  Location: [facility];  Service: General;  Laterality:  Right;  . Tonsillectomy and adenoidectomy  1964  . Removed anal warts  1976    Dr Terri PiedraLupton   . Nasal polyp surgery  01/05/2011    DR. NEWMAN   . Hemorrhoid surgery  04/15/14    thrombosed int. Hemorrhoid. Dr. Violeta GelinasBurke Thompson  . Tympanostomy tube placement  June 2015    Dr. Salena Saner. [place]   Social History:   reports that he quit smoking about 17 years ago. His smoking use included Cigarettes. He smoked 0.00 packs per day. He has never used smokeless tobacco. He reports that he drinks alcohol. He reports that he does not use illicit drugs.  Family History  Problem Relation Age of Onset  . Stroke Father   . Cancer Brother     prostate  . Cancer Paternal Grandfather     colon    Medications: Patient's Medications  New Prescriptions   No medications on file  Previous Medications   AMPHETAMINE-DEXTROAMPHETAMINE (ADDERALL XR) 10 MG 24 HR CAPSULE    Take one tablet once a day for nerves   BUTALBITAL-ACETAMINOPHEN-CAFFEINE (FIORICET) 50-325-40 MG PER TABLET    Take one tablet four times a day as needed for headaches   CLONAZEPAM (KLONOPIN) 0.5 MG TABLET    take 1 tablet by mouth three times a day if needed   DICYCLOMINE (BENTYL) 20 MG TABLET    Take 1 tablet (20 mg total) by mouth as needed.   FLUTICASONE (FLONASE) 50 MCG/ACT NASAL SPRAY    Place 2 sprays into the nose as needed.    HALOBETASOL (ULTRAVATE) 0.05 % CREAM    Apply topically 2 (two) times daily. Apply to rash up to three times a day   LOVASTATIN (MEVACOR) 20 MG TABLET    Take one tablet by mouth at bedtime to control cholesterol   MULTIPLE VITAMIN (MULTIVITAMIN) TABLET    Take 1 tablet by mouth daily.    Modified Medications   No medications on file  Discontinued Medications   No medications on file     Physical Exam:  Filed Vitals:   12/14/14 1610  BP: 138/84  Pulse: 63  Temp: 98.4 F (36.9 C)  TempSrc: Oral  Resp: 18  Height: 5\' 10"  (1.778 m)  Weight: 184 lb 6.4 oz (83.643 kg)  SpO2: 98%    Physical Exam    Constitutional: He is oriented to person, place, and time. He appears well-developed and well-nourished. No distress.  HENT:  Head: Normocephalic and atraumatic.  Mouth/Throat: Oropharynx is clear and moist. No oropharyngeal exudate.  Neck: Normal range of motion. Neck supple.  Cardiovascular: Normal rate, regular rhythm and normal heart sounds.   Pulmonary/Chest: Effort normal and breath sounds normal.  Abdominal: Soft. Bowel sounds are normal. There is tenderness (right lower quad ).  Neurological: He is alert and oriented to person, place, and time.  Skin: Skin is warm and dry. He is not diaphoretic.  Psychiatric: He has a normal mood and affect.    Labs reviewed: Basic Metabolic Panel:  Recent Labs  16/09/9610/13/15 0808  NA 139  K 4.6  CL 100  CO2 24  GLUCOSE 103*  BUN 13  CREATININE 0.95  CALCIUM 8.8   Liver Function Tests:  Recent Labs  11/06/14 0808  AST 16  ALT 25  ALKPHOS 61  BILITOT 0.2  PROT 6.2   No results for input(s): LIPASE, AMYLASE in the last 8760 hours. No results for input(s): AMMONIA in the last 8760 hours. CBC: No results for input(s): WBC, NEUTROABS, HGB, HCT, MCV, PLT in the last 8760 hours. Lipid Panel:  Recent Labs  11/06/14 0808  HDL 75  LDLCALC 100*  TRIG 54  CHOLHDL 2.5   TSH: No results for input(s): TSH in the last 8760 hours. A1C: No results found for: HGBA1C   Assessment/Plan  1. Diverticulosis of colon without hemorrhage -information given regarding prevention of diverticulitis -currently taking probiotic, increase to twice daily -Rx given prophylactically if symptoms worsen, fevers occur - metroNIDAZOLE (FLAGYL) 500 MG tablet; Take 1 tablet (500 mg total) by mouth 3 (three) times daily.  Dispense: 30 tablet; Refill: 0 - ciprofloxacin (CIPRO) 500 MG tablet; Take 1 tablet (500 mg total) by mouth 2 (two) times daily.  Dispense: 20 tablet; Refill: 0 -pt educated and aware of return precautions

## 2014-12-14 NOTE — Patient Instructions (Addendum)
prescriptions given if symptoms worsen Follow up if needed     Diverticulosis Diverticulosis is the condition that develops when small pouches (diverticula) form in the wall of your colon. Your colon, or large intestine, is where water is absorbed and stool is formed. The pouches form when the inside layer of your colon pushes through weak spots in the outer layers of your colon. CAUSES  No one knows exactly what causes diverticulosis. RISK FACTORS  Being older than 50. Your risk for this condition increases with age. Diverticulosis is rare in people younger than 40 years. By age 57, almost everyone has it.  Eating a low-fiber diet.  Being frequently constipated.  Being overweight.  Not getting enough exercise.  Smoking.   Diverticulitis Diverticulitis is inflammation or infection of small pouches in your colon that form when you have a condition called diverticulosis. The pouches in your colon are called diverticula. Your colon, or large intestine, is where water is absorbed and stool is formed. Complications of diverticulitis can include:  Bleeding.  Severe infection.  Severe pain.  Perforation of your colon.  Obstruction of your colon. CAUSES  Diverticulitis is caused by bacteria. Diverticulitis happens when stool becomes trapped in diverticula. This allows bacteria to grow in the diverticula, which can lead to inflammation and infection. RISK FACTORS People with diverticulosis are at risk for diverticulitis. Eating a diet that does not include enough fiber from fruits and vegetables may make diverticulitis more likely to develop. SYMPTOMS  Symptoms of diverticulitis may include:  Abdominal pain and tenderness. The pain is normally located on the left side of the abdomen, but may occur in other areas.  Fever and chills.  Bloating.  Cramping.  Nausea.  Vomiting.  Constipation.  Diarrhea.  Blood in your stool. DIAGNOSIS  Your health care provider will  ask you about your medical history and do a physical exam. You may need to have tests done because many medical conditions can cause the same symptoms as diverticulitis. Tests may include:  Blood tests.  Urine tests.  Imaging tests of the abdomen, including X-rays and CT scans. When your condition is under control, your health care provider may recommend that you have a colonoscopy. A colonoscopy can show how severe your diverticula are and whether something else is causing your symptoms. TREATMENT  Most cases of diverticulitis are mild and can be treated at home. Treatment may include:  Taking over-the-counter pain medicines.  Following a clear liquid diet.  Taking antibiotic medicines by mouth for 7-10 days. More severe cases may be treated at a hospital. Treatment may include:  Not eating or drinking.  Taking prescription pain medicine.  Receiving antibiotic medicines through an IV tube.  Receiving fluids and nutrition through an IV tube.  Surgery. HOME CARE INSTRUCTIONS   Follow your health care provider's instructions carefully.  Follow a full liquid diet or other diet as directed by your health care provider. After your symptoms improve, your health care provider may tell you to change your diet. He or she may recommend you eat a high-fiber diet. Fruits and vegetables are good sources of fiber. Fiber makes it easier to pass stool.  Take fiber supplements or probiotics as directed by your health care provider.  Only take medicines as directed by your health care provider.  Keep all your follow-up appointments. SEEK MEDICAL CARE IF:   Your pain does not improve.  You have a hard time eating food.  Your bowel movements do not return to normal. SEEK  IMMEDIATE MEDICAL CARE IF:   Your pain becomes worse.  Your symptoms do not get better.  Your symptoms suddenly get worse.  You have a fever.  You have repeated vomiting.  You have bloody or black, tarry  stools. MAKE SURE YOU:   Understand these instructions.  Will watch your condition.  Will get help right away if you are not doing well or get worse. Document Released: 09/20/2005 Document Revised: 12/16/2013 Document Reviewed: 11/05/2013 [facility] Patient Information 2015 [place], [place]. This information is not intended to replace advice given to you by your health care provider. Make sure you discuss any questions you have with your health care provider.   Taking over-the-counter pain medicines, like aspirin and ibuprofen. SYMPTOMS  Most people with diverticulosis do not have symptoms. DIAGNOSIS  Because diverticulosis often has no symptoms, health care providers often discover the condition during an exam for other colon problems. In many cases, a health care provider will diagnose diverticulosis while using a flexible scope to examine the colon (colonoscopy). TREATMENT  If you have never developed an infection related to diverticulosis, you may not need treatment. If you have had an infection before, treatment may include:  Eating more fruits, vegetables, and grains.  Taking a fiber supplement.  Taking a live bacteria supplement (probiotic).  Taking medicine to relax your colon. HOME CARE INSTRUCTIONS   Drink at least 6-8 glasses of water each day to prevent constipation.  Try not to strain when you have a bowel movement.  Keep all follow-up appointments. If you have had an infection before:  Increase the fiber in your diet as directed by your health care provider or dietitian.  Take a dietary fiber supplement if your health care provider approves.  Only take medicines as directed by your health care provider. SEEK MEDICAL CARE IF:   You have abdominal pain.  You have bloating.  You have cramps.  You have not gone to the bathroom in 3 days. SEEK IMMEDIATE MEDICAL CARE IF:   Your pain gets worse.  Yourbloating becomes very bad.  You have a fever or chills,  and your symptoms suddenly get worse.  You begin vomiting.  You have bowel movements that are bloody or black. MAKE SURE YOU:  Understand these instructions.  Will watch your condition.  Will get help right away if you are not doing well or get worse. Document Released: 09/07/2004 Document Revised: 12/16/2013 Document Reviewed: 11/05/2013 [facility] Patient Information 2015 [place], [place]. This information is not intended to replace advice given to you by your health care provider. Make sure you discuss any questions you have with your health care provider.

## 2014-12-23 ENCOUNTER — Other Ambulatory Visit: Payer: Self-pay | Admitting: *Deleted

## 2014-12-23 MED ORDER — AMPHETAMINE-DEXTROAMPHET ER 10 MG PO CP24: ORAL_CAPSULE | ORAL | Status: DC

## 2014-12-23 NOTE — Telephone Encounter (Signed)
Patient Requested and will pick up 

## 2015-01-22 ENCOUNTER — Other Ambulatory Visit: Payer: Self-pay | Admitting: *Deleted

## 2015-01-22 MED ORDER — AMPHETAMINE-DEXTROAMPHET ER 10 MG PO CP24: ORAL_CAPSULE | ORAL | Status: DC

## 2015-01-22 NOTE — Telephone Encounter (Signed)
Patient called and requested and will pick up 

## 2015-02-07 ENCOUNTER — Other Ambulatory Visit: Payer: Self-pay | Admitting: Internal Medicine

## 2015-02-08 ENCOUNTER — Encounter: Payer: Self-pay | Admitting: Internal Medicine

## 2015-02-08 ENCOUNTER — Ambulatory Visit (INDEPENDENT_AMBULATORY_CARE_PROVIDER_SITE_OTHER): Payer: BLUE CROSS/BLUE SHIELD | Admitting: Internal Medicine

## 2015-02-08 VITALS — BP 150/90 | HR 76 | Temp 98.4°F | Resp 18 | Ht 70.0 in | Wt 188.6 lb

## 2015-02-08 DIAGNOSIS — K5732 Diverticulitis of large intestine without perforation or abscess without bleeding: Secondary | ICD-10-CM

## 2015-02-08 MED ORDER — HYDROCODONE-ACETAMINOPHEN 5-325 MG PO TABS: ORAL_TABLET | ORAL | Status: DC

## 2015-02-08 MED ORDER — METRONIDAZOLE 500 MG PO TABS: 500.0000 mg | ORAL_TABLET | Freq: Three times a day (TID) | ORAL | Status: DC

## 2015-02-08 MED ORDER — CIPROFLOXACIN HCL 500 MG PO TABS: 500.0000 mg | ORAL_TABLET | Freq: Two times a day (BID) | ORAL | Status: DC

## 2015-02-08 NOTE — Progress Notes (Signed)
Patient ID: Connor MunsonRichard L [name], male   DOB: 1956/02/21, 59 y.o.   MRN: [id number]   [facility]   Allergies  Allergen Reactions  . Amitriptyline   . Zolpidem Tartrate Other (See Comments)    Severe headache  . Amoxicillin-Pot Clavulanate Rash  . Propoxyphene Hcl Nausea Only    Chief Complaint  Patient presents with  . Acute Visit    HPI: Patient is a 59 y.o. white male seen in the office today for acute visit due to LLQ pain.    No fever this am.  Says it takes a day or two usually.  3:30am, pain started.  Has continued to worsen through morning.  Now on lower part of left side.  Last 40 mins, started having bursts of sharper pain.  No bleeding from rectum.  Normal stool this am x 2.   Notes he has both I BS and diverticulosis.  Has given up peanuts, popcorn, sesame seeds.  No strawberries.    Review of Systems:  Review of Systems  Constitutional: Positive for malaise/fatigue. Negative for fever and chills.  Respiratory: Negative for shortness of breath.   Cardiovascular: Negative for chest pain and palpitations.  Gastrointestinal: Positive for abdominal pain. Negative for diarrhea, constipation, blood in stool and melena.     Past Medical History  Diagnosis Date  . Inguinal hernia 11/2011    right  . Arthritis     shoulders and hips  . Anxiety   . Sinus infection 11/2011    started antibiotic 12/18/2011 x 7 days; current cough  . High cholesterol   . ADD (attention deficit disorder)   . Eczema 11/2011    right hand  . Irritable bowel syndrome (IBS)   . Headache(784.0)     tension or sinus  . Other disorders of vitreous   . Poisoning and toxic reactions caused by other specified animals and plants   . Other atopic dermatitis and related conditions   . Inguinal hernia without mention of obstruction or gangrene, unilateral or unspecified, (not specified as recurrent)   . Routine general medical examination at a health  care facility   . Internal hemorrhoids without mention of complication   . Abdominal pain, left lower quadrant   . Other specified visual disturbances   . Pain in joint, pelvic region and thigh   . Special screening for malignant neoplasm of prostate   . Recent retinal detachment, partial, with single defect   . Attention deficit disorder without mention of hyperactivity   . Encounter for long-term (current) use of other medications   . Alcohol abuse, unspecified   . Obesity, unspecified   . Unspecified essential hypertension   . Atrial fibrillation   . Edema   . Major depressive disorder, recurrent episode, severe, without mention of psychotic behavior   . Depressive disorder, not elsewhere classified   . Actinic keratosis   . Sciatica   . Other and unspecified hyperlipidemia   . Other seborrheic keratosis   . Anxiety state, unspecified   . Tension headache   . Insomnia, unspecified   . Pathologic fracture of vertebrae   . Lumbago   . Cervicalgia   . Type II or unspecified type diabetes mellitus without mention of complication, not stated as uncontrolled   . Pain in joint, site unspecified   . Irritable bowel syndrome     Past Surgical History  Procedure Laterality Date  . Nasal sinus surgery  12/2010  .  Cataract extraction  2011    right eye  . Appendectomy  1987  . Hernia repair  05/03/2011    left  . Retinal detachment repair w/ scleral buckle le  07/22/2008    right eye; pars plana vitrectomy  . Inguinal hernia repair  12/28/2011    Procedure: HERNIA REPAIR INGUINAL ADULT;  Surgeon: Emelia Loron, MD;  Location: [facility];  Service: General;  Laterality: Right;  . Tonsillectomy and adenoidectomy  1964  . Removed anal warts  1976    Dr Terri Piedra   . Nasal polyp surgery  01/05/2011    DR. NEWMAN   . Hemorrhoid surgery  04/15/14    thrombosed int. Hemorrhoid. Dr. Violeta Gelinas  . Tympanostomy tube placement  June 2015    Dr. Salena Saner. [place]    Social  History:   reports that he quit smoking about 17 years ago. His smoking use included Cigarettes. He has never used smokeless tobacco. He reports that he drinks alcohol. He reports that he does not use illicit drugs.  Family History  Problem Relation Age of Onset  . Stroke Father   . Cancer Brother     prostate  . Cancer Paternal Grandfather     colon    Medications: Patient's Medications  New Prescriptions   No medications on file  Previous Medications   AMPHETAMINE-DEXTROAMPHETAMINE (ADDERALL XR) 10 MG 24 HR CAPSULE    Take one tablet once a day for nerves   BUTALBITAL-ACETAMINOPHEN-CAFFEINE (FIORICET) 50-325-40 MG PER TABLET    Take one tablet four times a day as needed for headaches   CLONAZEPAM (KLONOPIN) 0.5 MG TABLET    take 1 tablet by mouth three times a day if needed   DICYCLOMINE (BENTYL) 20 MG TABLET    Take 1 tablet (20 mg total) by mouth as needed.   FLUTICASONE (FLONASE) 50 MCG/ACT NASAL SPRAY    Place 2 sprays into the nose as needed.    HALOBETASOL (ULTRAVATE) 0.05 % CREAM    Apply topically 2 (two) times daily. Apply to rash up to three times a day   LOVASTATIN (MEVACOR) 20 MG TABLET    Take one tablet by mouth at bedtime to control cholesterol   MULTIPLE VITAMIN (MULTIVITAMIN) TABLET    Take 1 tablet by mouth daily.    Modified Medications   No medications on file  Discontinued Medications   CIPROFLOXACIN (CIPRO) 500 MG TABLET    Take 1 tablet (500 mg total) by mouth 2 (two) times daily.   METRONIDAZOLE (FLAGYL) 500 MG TABLET    Take 1 tablet (500 mg total) by mouth 3 (three) times daily.     Physical Exam: Filed Vitals:   02/08/15 1108  BP: 150/90  Pulse: 76  Temp: 98.4 F (36.9 C)  TempSrc: Oral  Resp: 18  Height:  (1.778 m)  Weight: 188 lb 9.6 oz (85.548 kg)  SpO2: 95%  Physical Exam  Constitutional: He is oriented to person, place, and time. He appears well-developed and well-nourished. No distress.  Cardiovascular: Normal rate, regular  rhythm, normal heart sounds and intact distal pulses.   Pulmonary/Chest: Effort normal and breath sounds normal. No respiratory distress.  Abdominal: Soft. Bowel sounds are normal. He exhibits no distension and no mass. There is tenderness.  LLQ tenderness  Musculoskeletal: Normal range of motion.  Neurological: He is alert and oriented to person, place, and time.    Labs reviewed: Basic Metabolic Panel:  Recent Labs  16/10/96 0808  NA  139  K 4.6  CL 100  CO2 24  GLUCOSE 103*  BUN 13  CREATININE 0.95  CALCIUM 8.8   Liver Function Tests:  Recent Labs  11/06/14 0808  AST 16  ALT 25  ALKPHOS 61  BILITOT 0.2  PROT 6.2   No results for input(s): LIPASE, AMYLASE in the last 8760 hours. No results for input(s): AMMONIA in the last 8760 hours. CBC: No results for input(s): WBC, NEUTROABS, HGB, HCT, MCV, PLT in the last 8760 hours. Lipid Panel:  Recent Labs  11/06/14 0808  HDL 75  LDLCALC 100*  TRIG 54  CHOLHDL 2.5   No results found for: HGBA1C  Assessment/Plan 1. Diverticulitis of large intestine without perforation or abscess without bleeding - he does not want imaging done today -has not yet had a fever -due to weather conditions and significant LLQ pain and tenderness, will prescribe abx--pt plans to pick them up at the pharmacy that's on his way instead of [facility] [place] - ciprofloxacin (CIPRO) 500 MG tablet; Take 1 tablet (500 mg total) by mouth 2 (two) times daily.  Dispense: 20 tablet; Refill: 0 - metroNIDAZOLE (FLAGYL) 500 MG tablet; Take 1 tablet (500 mg total) by mouth 3 (three) times daily.  Dispense: 21 tablet; Refill: 0 - HYDROcodone-acetaminophen (NORCO/VICODIN) 5-325 MG per tablet; 1-2 As needed for pain  1 for mild to moderate, 2 for severe  Dispense: 30 tablet; Refill: 0  Labs/tests ordered:  No new, advised to go to ED if he has any bleeding; if has fevers despite abx, should call  Next appt:  Keep regular visit with Dr. Chilton Si in  May  Connor [name] L. Desirey Keahey, D.O. Geriatrics [facility] [address], [place] [address] Cell Phone (Mon-Fri 8am-5pm):  [phone] On Call:  [phone] & follow prompts after 5pm & weekends Office Phone:  [phone] Office Fax:  [phone]

## 2015-02-08 NOTE — Patient Instructions (Signed)

## 2015-02-18 ENCOUNTER — Other Ambulatory Visit: Payer: BLUE CROSS/BLUE SHIELD

## 2015-02-18 ENCOUNTER — Other Ambulatory Visit: Payer: Self-pay | Admitting: Internal Medicine

## 2015-02-18 ENCOUNTER — Telehealth: Payer: Self-pay

## 2015-02-18 DIAGNOSIS — K5732 Diverticulitis of large intestine without perforation or abscess without bleeding: Secondary | ICD-10-CM

## 2015-02-18 DIAGNOSIS — R109 Unspecified abdominal pain: Secondary | ICD-10-CM

## 2015-02-18 DIAGNOSIS — R1032 Left lower quadrant pain: Secondary | ICD-10-CM

## 2015-02-18 DIAGNOSIS — Z8719 Personal history of other diseases of the digestive system: Secondary | ICD-10-CM

## 2015-02-18 NOTE — Telephone Encounter (Signed)
Ok, it will be CT abdomen/pelvis with IV and oral contrast.

## 2015-02-18 NOTE — Telephone Encounter (Signed)
Patient called to state he finished flagyl, still on Cipro. Patient c/o abdominal cramping and pain. Patient not sure if he should be referred to specialist, if Dr.Reed would like to see him again or if he needs more medications.  Last OV 02/08/15, diagnosis: Diverticulitis of large intestine without perforation or abscess without bleeding  Please advise

## 2015-02-18 NOTE — Telephone Encounter (Signed)
I discussed response with patient. Patient verbalized understanding of response. I schedule lab appointment for today.  Please place CT order

## 2015-02-18 NOTE — Telephone Encounter (Signed)
I recommend we do a CT of his abdomen and pelvis and come in today for cbc, bmp, liver panel, amylase and lipase.  I will determine whether contrast is needed.

## 2015-02-19 ENCOUNTER — Other Ambulatory Visit: Payer: Self-pay | Admitting: Internal Medicine

## 2015-02-19 ENCOUNTER — Other Ambulatory Visit: Payer: Self-pay

## 2015-02-19 DIAGNOSIS — K5732 Diverticulitis of large intestine without perforation or abscess without bleeding: Secondary | ICD-10-CM

## 2015-02-19 LAB — CBC WITH DIFFERENTIAL/PLATELET
Basophils Absolute: 0 10*3/uL (ref 0.0–0.2)
Basos: 0 %
Eos: 2 %
Eosinophils Absolute: 0.3 10*3/uL (ref 0.0–0.4)
HCT: 43.1 % (ref 37.5–51.0)
Hemoglobin: 14.6 g/dL (ref 12.6–17.7)
Immature Grans (Abs): 0 10*3/uL (ref 0.0–0.1)
Immature Granulocytes: 0 %
Lymphocytes Absolute: 2.3 10*3/uL (ref 0.7–3.1)
Lymphs: 20 %
MCH: 31.7 pg (ref 26.6–33.0)
MCHC: 33.9 g/dL (ref 31.5–35.7)
MCV: 94 fL (ref 79–97)
Monocytes Absolute: 1 10*3/uL — ABNORMAL HIGH (ref 0.1–0.9)
Monocytes: 9 %
Neutrophils Absolute: 7.9 10*3/uL — ABNORMAL HIGH (ref 1.4–7.0)
Neutrophils Relative %: 69 %
Platelets: 276 10*3/uL (ref 150–379)
RBC: 4.61 x10E6/uL (ref 4.14–5.80)
RDW: 13.1 % (ref 12.3–15.4)
WBC: 11.5 10*3/uL — ABNORMAL HIGH (ref 3.4–10.8)

## 2015-02-19 LAB — COMPREHENSIVE METABOLIC PANEL
ALT: 47 IU/L — ABNORMAL HIGH (ref 0–44)
AST: 27 IU/L (ref 0–40)
Albumin/Globulin Ratio: 2.7 — ABNORMAL HIGH (ref 1.1–2.5)
Albumin: 4.6 g/dL (ref 3.5–5.5)
Alkaline Phosphatase: 60 IU/L (ref 39–117)
BUN/Creatinine Ratio: 13 (ref 9–20)
BUN: 14 mg/dL (ref 6–24)
Bilirubin Total: 0.2 mg/dL (ref 0.0–1.2)
CO2: 25 mmol/L (ref 18–29)
Calcium: 8.9 mg/dL (ref 8.7–10.2)
Chloride: 101 mmol/L (ref 97–108)
Creatinine, Ser: 1.08 mg/dL (ref 0.76–1.27)
GFR calc Af Amer: 88 mL/min/{1.73_m2} (ref >59)
GFR calc non Af Amer: 76 mL/min/{1.73_m2} (ref >59)
Globulin, Total: 1.7 g/dL (ref 1.5–4.5)
Glucose: 107 mg/dL — ABNORMAL HIGH (ref 65–99)
Potassium: 4.4 mmol/L (ref 3.5–5.2)
Sodium: 141 mmol/L (ref 134–144)
Total Protein: 6.3 g/dL (ref 6.0–8.5)

## 2015-02-19 LAB — AMYLASE: Amylase: 49 U/L (ref 31–124)

## 2015-02-19 LAB — LIPASE: Lipase: 19 U/L (ref 0–59)

## 2015-02-19 MED ORDER — METRONIDAZOLE 500 MG PO TABS: 500.0000 mg | ORAL_TABLET | Freq: Three times a day (TID) | ORAL | Status: DC

## 2015-02-19 MED ORDER — CIPROFLOXACIN HCL 500 MG PO TABS: 500.0000 mg | ORAL_TABLET | Freq: Two times a day (BID) | ORAL | Status: DC

## 2015-02-19 NOTE — Progress Notes (Signed)
[facility] [place]

## 2015-02-22 ENCOUNTER — Ambulatory Visit
Admission: RE | Admit: 2015-02-22 | Discharge: 2015-02-22 | Disposition: A | Discharge status: Home / Self Care | Payer: BLUE CROSS/BLUE SHIELD | Source: Ambulatory Visit | Attending: Internal Medicine | Admitting: Internal Medicine

## 2015-02-22 ENCOUNTER — Encounter: Payer: Self-pay | Admitting: Internal Medicine

## 2015-02-22 ENCOUNTER — Other Ambulatory Visit: Payer: Self-pay | Admitting: *Deleted

## 2015-02-22 DIAGNOSIS — R1032 Left lower quadrant pain: Secondary | ICD-10-CM

## 2015-02-22 DIAGNOSIS — Z8719 Personal history of other diseases of the digestive system: Secondary | ICD-10-CM

## 2015-02-22 MED ORDER — IOHEXOL 300 MG/ML  SOLN
100.0000 mL | Freq: Once | INTRAMUSCULAR | Status: AC | PRN
  Administered 2015-02-22: 100 mL via INTRAVENOUS

## 2015-02-22 MED ORDER — AMPHETAMINE-DEXTROAMPHET ER 10 MG PO CP24: ORAL_CAPSULE | ORAL | Status: DC

## 2015-02-22 NOTE — Telephone Encounter (Signed)
Patient Requested and will pick up 

## 2015-02-24 ENCOUNTER — Telehealth: Payer: Self-pay

## 2015-02-24 NOTE — Telephone Encounter (Signed)
Patient aware of CT Results (refer to CT completed on 02/22/15). Patient would like to hold off on GI referral for now.   Patient then proceeded to ask about dicyclomine. Patient would like to know if Dr.Green (his PCP) would approve change in instructions for medication. Patient states when he has an IBS flare up he need 3-4 pills daily. This would require a higher dispense number as well.  Dr.Green please advise

## 2015-02-24 NOTE — Telephone Encounter (Signed)
I am willing to dispense more of the dicyclomine. He could get 120 tablets and change directions to "One tablet every 6 hours as needed to control pain from irritable bowel syndrome".

## 2015-02-25 MED ORDER — DICYCLOMINE HCL 20 MG PO TABS: ORAL_TABLET | ORAL | Status: DC

## 2015-02-25 NOTE — Telephone Encounter (Signed)
New rx with updated instructions sent to pharmacy. Left message on voicemail informing patient of this change

## 2015-03-15 ENCOUNTER — Other Ambulatory Visit: Payer: Self-pay | Admitting: *Deleted

## 2015-03-15 MED ORDER — CLONAZEPAM 0.5 MG PO TABS: ORAL_TABLET | ORAL | Status: DC

## 2015-03-15 NOTE — Telephone Encounter (Signed)
[facility]

## 2015-03-24 ENCOUNTER — Other Ambulatory Visit: Payer: Self-pay

## 2015-03-24 MED ORDER — AMPHETAMINE-DEXTROAMPHET ER 10 MG PO CP24: ORAL_CAPSULE | ORAL | Status: DC

## 2015-04-23 ENCOUNTER — Other Ambulatory Visit: Payer: Self-pay | Admitting: *Deleted

## 2015-04-23 MED ORDER — AMPHETAMINE-DEXTROAMPHET ER 10 MG PO CP24: ORAL_CAPSULE | ORAL | Status: DC

## 2015-04-23 NOTE — Telephone Encounter (Signed)
Patient requested and will pick up 

## 2015-05-12 ENCOUNTER — Ambulatory Visit (INDEPENDENT_AMBULATORY_CARE_PROVIDER_SITE_OTHER): Payer: BLUE CROSS/BLUE SHIELD | Admitting: Internal Medicine

## 2015-05-12 ENCOUNTER — Encounter: Payer: Self-pay | Admitting: Internal Medicine

## 2015-05-12 VITALS — BP 132/82 | HR 71 | Temp 98.1°F | Ht 70.0 in | Wt 184.8 lb

## 2015-05-12 DIAGNOSIS — I10 Essential (primary) hypertension: Secondary | ICD-10-CM | POA: Diagnosis not present

## 2015-05-12 DIAGNOSIS — F909 Attention-deficit hyperactivity disorder, unspecified type: Secondary | ICD-10-CM | POA: Diagnosis not present

## 2015-05-12 DIAGNOSIS — R002 Palpitations: Secondary | ICD-10-CM

## 2015-05-12 DIAGNOSIS — H7391 Unspecified disorder of tympanic membrane, right ear: Secondary | ICD-10-CM

## 2015-05-12 DIAGNOSIS — K645 Perianal venous thrombosis: Secondary | ICD-10-CM

## 2015-05-12 DIAGNOSIS — E785 Hyperlipidemia, unspecified: Secondary | ICD-10-CM | POA: Diagnosis not present

## 2015-05-12 DIAGNOSIS — R739 Hyperglycemia, unspecified: Secondary | ICD-10-CM

## 2015-05-12 DIAGNOSIS — H01139 Eczematous dermatitis of unspecified eye, unspecified eyelid: Secondary | ICD-10-CM | POA: Diagnosis not present

## 2015-05-12 DIAGNOSIS — Z125 Encounter for screening for malignant neoplasm of prostate: Secondary | ICD-10-CM

## 2015-05-12 DIAGNOSIS — G44221 Chronic tension-type headache, intractable: Secondary | ICD-10-CM | POA: Diagnosis not present

## 2015-05-12 DIAGNOSIS — K589 Irritable bowel syndrome without diarrhea: Secondary | ICD-10-CM

## 2015-05-12 DIAGNOSIS — F988 Other specified behavioral and emotional disorders with onset usually occurring in childhood and adolescence: Secondary | ICD-10-CM

## 2015-05-12 MED ORDER — BUTALBITAL-APAP-CAFFEINE 50-325-40 MG PO TABS: ORAL_TABLET | ORAL | Status: DC

## 2015-05-12 NOTE — Progress Notes (Signed)
Patient ID: Connor MunsonRichard L [name], male   DOB: December 29, 1955, 59 y.o.   MRN: [id number]    Facility  [facility]    Place of Service:   OFFICE    Allergies  Allergen Reactions  . Amitriptyline   . Zolpidem Tartrate Other (See Comments)    Severe headache  . Amoxicillin-Pot Clavulanate Rash  . Propoxyphene Hcl Nausea Only    Chief Complaint  Patient presents with  . Medical Management of Chronic Issues    24Month Follow up    HPI:  Palpitation: Soso PVCs. Never accompanied by shortness of breath or pain.  Irritable bowel syndrome (IBS): Relatively quiet at present time. Denies much problem with either diarrhea or constipation.  Benign essential HTN: Controlled  ADD (attention deficit disorder): Stable on current medication  Tympanic membrane disorder, right: Continues with tympanostomy tube in the right tympanic membrane. Has lost hearing on the right side. Denies pain.  Eczematous dermatitis: Affects hands. He believes sunlight makes it worse. He uses Ultravate with a good response.  Chronic tension-type headache, intractable: Recurrent issue. Requesting Fioricet. Last headache about 6 weeks ago.  Thrombosed external hemorrhoid: Resolved without surgical intervention.    Medications: Patient's Medications  New Prescriptions   No medications on file  Previous Medications   AMBULATORY NON FORMULARY MEDICATION    IBgard Sig: Take one tablet by mouth three times daily as needed for IBS Symptoms   AMPHETAMINE-DEXTROAMPHETAMINE (ADDERALL XR) 10 MG 24 HR CAPSULE    Take one tablet once a day for nerves   BUTALBITAL-ACETAMINOPHEN-CAFFEINE (FIORICET) 50-325-40 MG PER TABLET    Take one tablet four times a day as needed for headaches   CLONAZEPAM (KLONOPIN) 0.5 MG TABLET    Take one tablet by mouth three times daily as needed   DICYCLOMINE (BENTYL) 20 MG TABLET    One tablet every 6 hours as needed to control pain from irritable bowel syndrome   FLUTICASONE (FLONASE) 50 MCG/ACT NASAL SPRAY     Place 2 sprays into the nose as needed.    HALOBETASOL (ULTRAVATE) 0.05 % CREAM    Apply topically 2 (two) times daily. Apply to rash up to three times a day   HYDROCODONE-ACETAMINOPHEN (NORCO/VICODIN) 5-325 MG PER TABLET       LOVASTATIN (MEVACOR) 20 MG TABLET    Take one tablet by mouth at bedtime to control cholesterol   MULTIPLE VITAMIN (MULTIVITAMIN) TABLET    Take 1 tablet by mouth daily.    Modified Medications   No medications on file  Discontinued Medications   CIPROFLOXACIN (CIPRO) 500 MG TABLET    Take 1 tablet (500 mg total) by mouth 2 (two) times daily.   HYDROCODONE-ACETAMINOPHEN (NORCO/VICODIN) 5-325 MG PER TABLET    1-2 As needed for pain  1 for mild to moderate, 2 for severe   METRONIDAZOLE (FLAGYL) 500 MG TABLET    Take 1 tablet (500 mg total) by mouth 3 (three) times daily.     Review of Systems  Constitutional: Positive for fatigue.  HENT: Negative for ear pain.        Recurrent sinus congestion. Has seen Dr. Narda Bondshris Newman several timeS for SINUS infection. Had right tympanostomy tube in July 2015.  Eyes: Positive for visual disturbance (corrective lenses).  Cardiovascular: Negative for chest pain, palpitations and leg swelling.  Gastrointestinal: Negative for nausea, abdominal pain, diarrhea, constipation and abdominal distention.       Intermittent abd spasm and discomfort that is linked to his IBS.   Endocrine: Negative.  Musculoskeletal: Positive for arthralgias.  Skin: Negative.   Allergic/Immunologic: Negative.   Neurological: Positive for headaches.  Hematological: Negative.   Psychiatric/Behavioral: Positive for decreased concentration (Hx ADD. Currentl undeer contro with Adderall.). The patient is nervous/anxious.     Filed Vitals:   05/12/15 1206  BP: 132/82  Pulse: 71  Temp: 98.1 F (36.7 C)  TempSrc: Oral  Height:  (1.778 m)  Weight: 184 lb 12.8 oz (83.825 kg)   Body mass index is 26.52 kg/(m^2).  Physical Exam  Constitutional: He  is oriented to person, place, and time. He appears well-developed and well-nourished. No distress.  HENT:  Head: Normocephalic and atraumatic.  Right Ear: External ear normal.  Left Ear: External ear normal.  Nose: Nose normal.  Mouth/Throat: Oropharynx is clear and moist.  Blue tympanostomy tube right EAC  Eyes: Conjunctivae and EOM are normal. Pupils are equal, round, and reactive to light.  Neck: No JVD present. No tracheal deviation present. No thyromegaly present.  Cardiovascular: Normal rate, regular rhythm and intact distal pulses.  Exam reveals no gallop and no friction rub.   No murmur heard. Pulmonary/Chest: No respiratory distress. He has no wheezes. He has no rales.  Abdominal: He exhibits no distension and no mass. There is no tenderness.  Genitourinary:  Pencil thickness thrombosed hemorrhoid at 8 o'clock. Tender.  Musculoskeletal: Normal range of motion. He exhibits no edema or tenderness.  Lymphadenopathy:    He has no cervical adenopathy.  Neurological: He is alert and oriented to person, place, and time. No cranial nerve deficit. Coordination normal.  Skin: No rash noted. No erythema. No pallor.  Psychiatric: He has a normal mood and affect. His behavior is normal. Judgment and thought content normal.     Labs reviewed: Appointment on 02/18/2015  Component Date Value Ref Range Status  . WBC 02/18/2015 11.5* 3.4 - 10.8 x10E3/uL Final  . RBC 02/18/2015 4.61  4.14 - 5.80 x10E6/uL Final  . Hemoglobin 02/18/2015 14.6  12.6 - 17.7 g/dL Final  . HCT 96/03/5408 43.1  37.5 - 51.0 % Final  . MCV 02/18/2015 94  79 - 97 fL Final  . MCH 02/18/2015 31.7  26.6 - 33.0 pg Final  . MCHC 02/18/2015 33.9  31.5 - 35.7 g/dL Final  . RDW 81/19/1478 13.1  12.3 - 15.4 % Final  . Platelets 02/18/2015 276  150 - 379 x10E3/uL Final  . Neutrophils Relative % 02/18/2015 69   Final  . Lymphs 02/18/2015 20   Final  . Monocytes 02/18/2015 9   Final  . Eos 02/18/2015 2   Final  . Basos  02/18/2015 0   Final  . Neutrophils Absolute 02/18/2015 7.9* 1.4 - 7.0 x10E3/uL Final  . Lymphocytes Absolute 02/18/2015 2.3  0.7 - 3.1 x10E3/uL Final  . Monocytes Absolute 02/18/2015 1.0* 0.1 - 0.9 x10E3/uL Final  . Eosinophils Absolute 02/18/2015 0.3  0.0 - 0.4 x10E3/uL Final  . Basophils Absolute 02/18/2015 0.0  0.0 - 0.2 x10E3/uL Final  . Immature Granulocytes 02/18/2015 0   Final  . Immature Grans (Abs) 02/18/2015 0.0  0.0 - 0.1 x10E3/uL Final  . Glucose 02/18/2015 107* 65 - 99 mg/dL Final  . BUN 29/56/2130 14  6 - 24 mg/dL Final  . Creatinine, Ser 02/18/2015 1.08  0.76 - 1.27 mg/dL Final  . GFR calc non Af Amer 02/18/2015 76  >59 mL/min/1.73 Final  . GFR calc Af Amer 02/18/2015 88  >59 mL/min/1.73 Final  . BUN/Creatinine Ratio 02/18/2015 13  9 -  20 Final  . Sodium 02/18/2015 141  134 - 144 mmol/L Final  . Potassium 02/18/2015 4.4  3.5 - 5.2 mmol/L Final  . Chloride 02/18/2015 101  97 - 108 mmol/L Final  . CO2 02/18/2015 25  18 - 29 mmol/L Final  . Calcium 02/18/2015 8.9  8.7 - 10.2 mg/dL Final  . Total Protein 02/18/2015 6.3  6.0 - 8.5 g/dL Final  . Albumin 16/10/960402/25/2016 4.6  3.5 - 5.5 g/dL Final  . Globulin, Total 02/18/2015 1.7  1.5 - 4.5 g/dL Final  . Albumin/Globulin Ratio 02/18/2015 2.7* 1.1 - 2.5 Final  . Bilirubin Total 02/18/2015 0.2  0.0 - 1.2 mg/dL Final  . Alkaline Phosphatase 02/18/2015 60  39 - 117 IU/L Final  . AST 02/18/2015 27  0 - 40 IU/L Final  . ALT 02/18/2015 47* 0 - 44 IU/L Final  . Amylase 02/18/2015 49  31 - 124 U/L Final  . Lipase 02/18/2015 19  0 - 59 U/L Final     Assessment/Plan  1. Palpitation Benign problem. No further workup indicated at this time.  2. Irritable bowel syndrome (IBS) Observe.  3. Benign essential HTN Trolled. - Comprehensive metabolic panel; Future  4. ADD (attention deficit disorder) Controlled on current medication  5. Tympanic membrane disorder, right 10 use Dr. Ezzard StandingNewman as needed. Tympanostomy tube intact in right  tympanic membrane.  6. Eczematous dermatitis of eyelid, unspecified laterality Mild and present on the dorsum of both hands  7. Chronic tension-type headache, intractable - butalbital-acetaminophen-caffeine (FIORICET) 50-325-40 MG per tablet; Take one tablet four times a day as needed for headaches  Dispense: 120 tablet; Refill: 5  8. Thrombosed external hemorrhoid resolved  9. Hyperlipidemia -Lipid panel, future  10. Hyperglycemia -CMP, future - Comprehensive metabolic panel; Future  11. Screening PSA (prostate specific antigen) - PSA; Future

## 2015-05-25 ENCOUNTER — Other Ambulatory Visit: Payer: Self-pay | Admitting: *Deleted

## 2015-05-25 MED ORDER — AMPHETAMINE-DEXTROAMPHET ER 10 MG PO CP24: ORAL_CAPSULE | ORAL | Status: DC

## 2015-05-25 NOTE — Telephone Encounter (Signed)
Patient Requested and will pick up 

## 2015-06-29 ENCOUNTER — Other Ambulatory Visit: Payer: Self-pay

## 2015-06-29 MED ORDER — AMPHETAMINE-DEXTROAMPHET ER 10 MG PO CP24: ORAL_CAPSULE | ORAL | Status: DC

## 2015-06-29 NOTE — Telephone Encounter (Signed)
Patient left message on voicemail requesting refill. I called patient and informed him rx is available for pick-up

## 2015-07-29 ENCOUNTER — Other Ambulatory Visit: Payer: Self-pay | Admitting: *Deleted

## 2015-07-29 MED ORDER — AMPHETAMINE-DEXTROAMPHET ER 10 MG PO CP24: ORAL_CAPSULE | ORAL | Status: DC

## 2015-07-29 NOTE — Telephone Encounter (Signed)
Patient requested and will pick up 

## 2015-08-16 ENCOUNTER — Other Ambulatory Visit: Payer: BLUE CROSS/BLUE SHIELD

## 2015-08-18 ENCOUNTER — Ambulatory Visit: Payer: BLUE CROSS/BLUE SHIELD | Admitting: Internal Medicine

## 2015-08-31 ENCOUNTER — Other Ambulatory Visit: Payer: Self-pay | Admitting: *Deleted

## 2015-08-31 MED ORDER — AMPHETAMINE-DEXTROAMPHET ER 10 MG PO CP24: ORAL_CAPSULE | ORAL | Status: DC

## 2015-08-31 NOTE — Telephone Encounter (Signed)
Patient requested and will pick up 

## 2015-09-28 ENCOUNTER — Other Ambulatory Visit: Payer: Self-pay | Admitting: *Deleted

## 2015-09-28 MED ORDER — LOVASTATIN 20 MG PO TABS: ORAL_TABLET | ORAL | Status: DC

## 2015-09-28 NOTE — Telephone Encounter (Signed)
[facility]

## 2015-09-30 ENCOUNTER — Other Ambulatory Visit: Payer: Self-pay

## 2015-09-30 MED ORDER — AMPHETAMINE-DEXTROAMPHET ER 10 MG PO CP24: ORAL_CAPSULE | ORAL | Status: DC

## 2015-09-30 NOTE — Telephone Encounter (Signed)
Patient called for refill on Adderall  prescription filled for his pick up

## 2015-10-05 ENCOUNTER — Ambulatory Visit (INDEPENDENT_AMBULATORY_CARE_PROVIDER_SITE_OTHER): Payer: BLUE CROSS/BLUE SHIELD | Admitting: Nurse Practitioner

## 2015-10-05 ENCOUNTER — Encounter: Payer: Self-pay | Admitting: Nurse Practitioner

## 2015-10-05 VITALS — BP 142/88 | HR 72 | Temp 98.4°F | Ht 70.0 in | Wt 179.2 lb

## 2015-10-05 DIAGNOSIS — K5730 Diverticulosis of large intestine without perforation or abscess without bleeding: Secondary | ICD-10-CM | POA: Diagnosis not present

## 2015-10-05 MED ORDER — HYDROCODONE-ACETAMINOPHEN 5-325 MG PO TABS: 1.0000 | ORAL_TABLET | Freq: Four times a day (QID) | ORAL | Status: DC | PRN

## 2015-10-05 MED ORDER — METRONIDAZOLE 500 MG PO TABS: 500.0000 mg | ORAL_TABLET | Freq: Three times a day (TID) | ORAL | Status: DC

## 2015-10-05 MED ORDER — CIPROFLOXACIN HCL 500 MG PO TABS: 500.0000 mg | ORAL_TABLET | Freq: Two times a day (BID) | ORAL | Status: DC

## 2015-10-05 NOTE — Patient Instructions (Signed)
Diverticulitis °Diverticulitis is inflammation or infection of small pouches in your colon that form when you have a condition called diverticulosis. The pouches in your colon are called diverticula. Your colon, or large intestine, is where water is absorbed and stool is formed. °Complications of diverticulitis can include: °· Bleeding. °· Severe infection. °· Severe pain. °· Perforation of your colon. °· Obstruction of your colon. °CAUSES  °Diverticulitis is caused by bacteria. °Diverticulitis happens when stool becomes trapped in diverticula. This allows bacteria to grow in the diverticula, which can lead to inflammation and infection. °RISK FACTORS °People with diverticulosis are at risk for diverticulitis. Eating a diet that does not include enough fiber from fruits and vegetables may make diverticulitis more likely to develop. °SYMPTOMS  °Symptoms of diverticulitis may include: °· Abdominal pain and tenderness. The pain is normally located on the left side of the abdomen, but may occur in other areas. °· Fever and chills. °· Bloating. °· Cramping. °· Nausea. °· Vomiting. °· Constipation. °· Diarrhea. °· Blood in your stool. °DIAGNOSIS  °Your health care provider will ask you about your medical history and do a physical exam. You may need to have tests done because many medical conditions can cause the same symptoms as diverticulitis. Tests may include: °· Blood tests. °· Urine tests. °· Imaging tests of the abdomen, including X-rays and CT scans. °When your condition is under control, your health care provider may recommend that you have a colonoscopy. A colonoscopy can show how severe your diverticula are and whether something else is causing your symptoms. °TREATMENT  °Most cases of diverticulitis are mild and can be treated at home. Treatment may include: °· Taking over-the-counter pain medicines. °· Following a clear liquid diet. °· Taking antibiotic medicines by mouth for 7-10 days. °More severe cases may  be treated at a hospital. Treatment may include: °· Not eating or drinking. °· Taking prescription pain medicine. °· Receiving antibiotic medicines through an IV tube. °· Receiving fluids and nutrition through an IV tube. °· Surgery. °HOME CARE INSTRUCTIONS  °· Follow your health care provider's instructions carefully. °· Follow a full liquid diet or other diet as directed by your health care provider. After your symptoms improve, your health care provider may tell you to change your diet. He or she may recommend you eat a high-fiber diet. Fruits and vegetables are good sources of fiber. Fiber makes it easier to pass stool. °· Take fiber supplements or probiotics as directed by your health care provider. °· Only take medicines as directed by your health care provider. °· Keep all your follow-up appointments. °SEEK MEDICAL CARE IF:  °· Your pain does not improve. °· You have a hard time eating food. °· Your bowel movements do not return to normal. °SEEK IMMEDIATE MEDICAL CARE IF:  °· Your pain becomes worse. °· Your symptoms do not get better. °· Your symptoms suddenly get worse. °· You have a fever. °· You have repeated vomiting. °· You have bloody or black, tarry stools. °MAKE SURE YOU:  °· Understand these instructions. °· Will watch your condition. °· Will get help right away if you are not doing well or get worse. °  °This information is not intended to replace advice given to you by your health care provider. Make sure you discuss any questions you have with your health care provider. °  °Document Released: 09/20/2005 Document Revised: 12/16/2013 Document Reviewed: 11/05/2013 °Elsevier Interactive Patient Education ©2016 [employer] ° °

## 2015-10-05 NOTE — Progress Notes (Signed)
Patient ID: Connor [name], male   DOB: 1956/11/10, 59 y.o.   MRN: [id number]    PCP: Kimber Relic, MD  Advanced Directive information    Allergies  Allergen Reactions  . Amitriptyline   . Zolpidem Tartrate Other (See Comments)    Severe headache  . Amoxicillin-Pot Clavulanate Rash  . Propoxyphene Hcl Nausea Only    Chief Complaint  Patient presents with  . Acute Visit    Diverticulitis      HPI: Patient is a 59 y.o. male seen in the office today due to left lower quadrant abdominal pain. Had pain early this morning and thought maybe it was due to IBS. At 930 he had to leave work. Has made dietary modifications avoiding nuts, popcorn, seeds etc. No fever or chills. Feels poorly overall.  No diarrhea but has had multiple BMs this morning. No bloody stools.     Review of Systems:  Review of Systems  Constitutional: Negative for fever and chills.  Respiratory: Negative for shortness of breath.   Cardiovascular: Negative for chest pain and palpitations.  Gastrointestinal: Positive for abdominal pain. Negative for diarrhea, constipation, blood in stool and abdominal distention.  Genitourinary: Negative for dysuria.  Neurological: Negative for weakness.    Past Medical History  Diagnosis Date  . Inguinal hernia 11/2011    right  . Arthritis     shoulders and hips  . Anxiety   . Sinus infection 11/2011    started antibiotic 12/18/2011 x 7 days; current cough  . High cholesterol   . ADD (attention deficit disorder)   . Eczema 11/2011    right hand  . Irritable bowel syndrome (IBS)   . Headache(784.0)     tension or sinus  . Other disorders of vitreous   . Poisoning and toxic reactions caused by other specified animals and plants   . Other atopic dermatitis and related conditions   . Inguinal hernia without mention of obstruction or gangrene, unilateral or unspecified, (not specified as recurrent)   . Routine general medical examination at a health care facility     . Internal hemorrhoids without mention of complication   . Abdominal pain, left lower quadrant   . Other specified visual disturbances   . Pain in joint, pelvic region and thigh   . Special screening for malignant neoplasm of prostate   . Recent retinal detachment, partial, with single defect   . Attention deficit disorder without mention of hyperactivity   . Encounter for long-term (current) use of other medications   . Alcohol abuse, unspecified   . Obesity, unspecified   . Unspecified essential hypertension   . Atrial fibrillation (HCC)   . Edema   . Major depressive disorder, recurrent episode, severe, without mention of psychotic behavior   . Depressive disorder, not elsewhere classified   . Actinic keratosis   . Sciatica   . Other and unspecified hyperlipidemia   . Other seborrheic keratosis   . Anxiety state, unspecified   . Tension headache   . Insomnia, unspecified   . Pathologic fracture of vertebrae (HCC)   . Lumbago   . Cervicalgia   . Type II or unspecified type diabetes mellitus without mention of complication, not stated as uncontrolled   . Pain in joint, site unspecified   . Irritable bowel syndrome    Past Surgical History  Procedure Laterality Date  . Nasal sinus surgery  12/2010  . Cataract extraction  2011    right eye  . Appendectomy  1987  . Hernia repair  05/03/2011    left  . Retinal detachment repair w/ scleral buckle le  07/22/2008    right eye; pars plana vitrectomy  . Inguinal hernia repair  12/28/2011    Procedure: HERNIA REPAIR INGUINAL ADULT;  Surgeon: Emelia Loron, MD;  Location: [facility];  Service: General;  Laterality: Right;  . Tonsillectomy and adenoidectomy  1964  . Removed anal warts  1976    Dr Terri Piedra   . Nasal polyp surgery  01/05/2011    DR. NEWMAN   . Hemorrhoid surgery  04/15/14    thrombosed int. Hemorrhoid. Dr. Violeta Gelinas  . Tympanostomy tube placement  June 2015    Dr. Salena Saner. Newman   Social History:    reports that he quit smoking about 18 years ago. His smoking use included Cigarettes. He has never used smokeless tobacco. He reports that he drinks alcohol. He reports that he does not use illicit drugs.  Family History  Problem Relation Age of Onset  . Stroke Father   . Cancer Brother     prostate  . Cancer Paternal Grandfather     colon    Medications: Patient's Medications  New Prescriptions   No medications on file  Previous Medications   AMPHETAMINE-DEXTROAMPHETAMINE (ADDERALL XR) 10 MG 24 HR CAPSULE    Take one tablet once a day for nerves   BUTALBITAL-ACETAMINOPHEN-CAFFEINE (FIORICET) 50-325-40 MG PER TABLET    Take one tablet four times a day as needed for headaches   CLONAZEPAM (KLONOPIN) 0.5 MG TABLET    Take one tablet by mouth three times daily as needed   DICYCLOMINE (BENTYL) 20 MG TABLET    One tablet every 6 hours as needed to control pain from irritable bowel syndrome   FLUTICASONE (FLONASE) 50 MCG/ACT NASAL SPRAY    Place 2 sprays into the nose as needed.    HALOBETASOL (ULTRAVATE) 0.05 % CREAM    Apply topically 2 (two) times daily. Apply to rash up to three times a day   LOVASTATIN (MEVACOR) 20 MG TABLET    Take one tablet by mouth at bedtime to control cholesterol   MULTIPLE VITAMIN (MULTIVITAMIN) TABLET    Take 1 tablet by mouth daily.    Modified Medications   No medications on file  Discontinued Medications   AMBULATORY NON FORMULARY MEDICATION    IBgard Sig: Take one tablet by mouth three times daily as needed for IBS Symptoms   HYDROCODONE-ACETAMINOPHEN (NORCO/VICODIN) 5-325 MG PER TABLET         Physical Exam:  Filed Vitals:   10/05/15 1257  BP: 142/88  Pulse: 72  Temp: 98.4 F (36.9 C)  TempSrc: Oral  Height:  (1.778 m)  Weight: 179 lb 3.2 oz (81.285 kg)  SpO2: 97%   Body mass index is 25.71 kg/(m^2).  Physical Exam  Constitutional: He is oriented to person, place, and time. He appears well-developed and well-nourished. No distress.    Cardiovascular: Normal rate, regular rhythm, normal heart sounds and intact distal pulses.   Pulmonary/Chest: Effort normal and breath sounds normal. No respiratory distress.  Abdominal: Soft. Bowel sounds are normal. He exhibits no distension and no mass. There is tenderness.  LLQ tenderness  Neurological: He is alert and oriented to person, place, and time.  Skin: Skin is warm and dry.  Psychiatric: He has a normal mood and affect.    Labs reviewed: Basic Metabolic Panel:  Recent Labs  60/45/40 0808 02/18/15 1333  NA  139 141  K 4.6 4.4  CL 100 101  CO2 24 25  GLUCOSE 103* 107*  BUN 13 14  CREATININE 0.95 1.08  CALCIUM 8.8 8.9   Liver Function Tests:  Recent Labs  11/06/14 0808 02/18/15 1333  AST 16 27  ALT 25 47*  ALKPHOS 61 60  BILITOT 0.2 0.2  PROT 6.2 6.3  ALBUMIN 4.3 4.6    Recent Labs  02/18/15 1333  LIPASE 19  AMYLASE 49   No results for input(s): AMMONIA in the last 8760 hours. CBC:  Recent Labs  02/18/15 1333  WBC 11.5*  NEUTROABS 7.9*  HGB 14.6  HCT 43.1  MCV 94  PLT 276   Lipid Panel:  Recent Labs  11/06/14 0808  CHOL 186  HDL 75  LDLCALC 100*  TRIG 54  CHOLHDL 2.5   TSH: No results for input(s): TSH in the last 8760 hours. A1C: No results found for: HGBA1C   Assessment/Plan 1. Diverticulosis of colon without hemorrhage -discussed return precautions- pt understands if symptoms persist on treatment to seek medial attention.  - ciprofloxacin (CIPRO) 500 MG tablet; Take 1 tablet (500 mg total) by mouth 2 (two) times daily.  Dispense: 20 tablet; Refill: 0 - metroNIDAZOLE (FLAGYL) 500 MG tablet; Take 1 tablet (500 mg total) by mouth 3 (three) times daily.  Dispense: 21 tablet; Refill: 0 - HYDROcodone-acetaminophen (NORCO/VICODIN) 5-325 MG tablet; Take 1 tablet by mouth every 6 (six) hours as needed for moderate pain.  Dispense: 12 tablet; Refill: 0  Josselyne Onofrio K. Biagio Borg  [facility] [phone] 8 am - 5 pm) [phone] (after hours)'

## 2015-11-01 ENCOUNTER — Other Ambulatory Visit: Payer: Self-pay | Admitting: *Deleted

## 2015-11-01 MED ORDER — AMPHETAMINE-DEXTROAMPHET ER 10 MG PO CP24: ORAL_CAPSULE | ORAL | Status: DC

## 2015-11-01 NOTE — Telephone Encounter (Signed)
Patient requested and will pick up. Narcotic contract printed. 

## 2015-11-05 ENCOUNTER — Other Ambulatory Visit: Payer: BLUE CROSS/BLUE SHIELD

## 2015-11-05 DIAGNOSIS — E785 Hyperlipidemia, unspecified: Secondary | ICD-10-CM

## 2015-11-05 DIAGNOSIS — R739 Hyperglycemia, unspecified: Secondary | ICD-10-CM

## 2015-11-05 DIAGNOSIS — Z125 Encounter for screening for malignant neoplasm of prostate: Secondary | ICD-10-CM

## 2015-11-05 DIAGNOSIS — I10 Essential (primary) hypertension: Secondary | ICD-10-CM

## 2015-11-06 LAB — COMPREHENSIVE METABOLIC PANEL
ALK PHOS: 82 IU/L (ref 39–117)
ALT: 9 IU/L (ref 0–44)
AST: 14 IU/L (ref 0–40)
Albumin/Globulin Ratio: 1.5 (ref 1.1–2.5)
Albumin: 3.8 g/dL (ref 3.5–5.5)
BUN / CREAT RATIO: 17 (ref 9–20)
BUN: 26 mg/dL — AB (ref 6–24)
Bilirubin Total: 0.4 mg/dL (ref 0.0–1.2)
CALCIUM: 9.4 mg/dL (ref 8.7–10.2)
CO2: 25 mmol/L (ref 18–29)
CREATININE: 1.52 mg/dL — AB (ref 0.76–1.27)
Chloride: 100 mmol/L (ref 97–106)
GFR, EST AFRICAN AMERICAN: 58 mL/min/{1.73_m2} — AB (ref >59)
GFR, EST NON AFRICAN AMERICAN: 50 mL/min/{1.73_m2} — AB (ref >59)
GLOBULIN, TOTAL: 2.5 g/dL (ref 1.5–4.5)
GLUCOSE: 79 mg/dL (ref 65–99)
Potassium: 4.8 mmol/L (ref 3.5–5.2)
SODIUM: 138 mmol/L (ref 136–144)
TOTAL PROTEIN: 6.3 g/dL (ref 6.0–8.5)

## 2015-11-06 LAB — LIPID PANEL
CHOL/HDL RATIO: 2.7 ratio (ref 0.0–5.0)
CHOLESTEROL TOTAL: 156 mg/dL (ref 100–199)
HDL: 57 mg/dL (ref >39)
LDL CALC: 86 mg/dL (ref 0–99)
Triglycerides: 63 mg/dL (ref 0–149)
VLDL CHOLESTEROL CAL: 13 mg/dL (ref 5–40)

## 2015-11-06 LAB — PSA: Prostate Specific Ag, Serum: <0.1 ng/mL (ref 0.0–4.0)

## 2015-11-09 ENCOUNTER — Ambulatory Visit (INDEPENDENT_AMBULATORY_CARE_PROVIDER_SITE_OTHER): Payer: BLUE CROSS/BLUE SHIELD | Admitting: Internal Medicine

## 2015-11-09 ENCOUNTER — Encounter: Payer: Self-pay | Admitting: Internal Medicine

## 2015-11-09 VITALS — BP 128/78 | HR 96 | Temp 98.4°F | Resp 18 | Ht 70.0 in | Wt 178.6 lb

## 2015-11-09 DIAGNOSIS — E785 Hyperlipidemia, unspecified: Secondary | ICD-10-CM | POA: Diagnosis not present

## 2015-11-09 DIAGNOSIS — L309 Dermatitis, unspecified: Secondary | ICD-10-CM

## 2015-11-09 DIAGNOSIS — R739 Hyperglycemia, unspecified: Secondary | ICD-10-CM

## 2015-11-09 DIAGNOSIS — I10 Essential (primary) hypertension: Secondary | ICD-10-CM | POA: Diagnosis not present

## 2015-11-09 DIAGNOSIS — F909 Attention-deficit hyperactivity disorder, unspecified type: Secondary | ICD-10-CM | POA: Diagnosis not present

## 2015-11-09 DIAGNOSIS — K589 Irritable bowel syndrome without diarrhea: Secondary | ICD-10-CM

## 2015-11-09 DIAGNOSIS — F988 Other specified behavioral and emotional disorders with onset usually occurring in childhood and adolescence: Secondary | ICD-10-CM

## 2015-11-09 MED ORDER — HALOBETASOL PROPIONATE 0.05 % EX CREA: TOPICAL_CREAM | Freq: Two times a day (BID) | CUTANEOUS | Status: DC

## 2015-11-09 MED ORDER — CLONAZEPAM 0.5 MG PO TABS: ORAL_TABLET | ORAL | Status: DC

## 2015-11-09 NOTE — Progress Notes (Signed)
Patient ID: Connor [name], male   DOB: May 10, 1956, 59 y.o.   MRN: [id number]    Facility  [facility]    Place of Service:   OFFICE    Allergies  Allergen Reactions  . Amitriptyline   . Zolpidem Tartrate Other (See Comments)    Severe headache  . Amoxicillin-Pot Clavulanate Rash  . Propoxyphene Hcl Nausea Only    Chief Complaint  Patient presents with  . Medical Management of Chronic Issues    3 month follow-up for Hypertensio, Hyperlipidemia, Hyperglycemia    HPI:   Irritable bowel syndrome (IBS) - chronic intermittent discomfort with diarrhea and sometimes constipation  ADD (attention deficit disorder) - continues to benefit from Adderall. He feels his ADD is under control and has been also problem since being on this drug.  Hyperlipidemia - controlled  Benign essential HTN  - controlled -   Hyperglycemia - controlled   Eczema - More evident on dorsum of hands. Benefits from [place].    Medications: Patient's Medications  New Prescriptions   No medications on file  Previous Medications   AMPHETAMINE-DEXTROAMPHETAMINE (ADDERALL XR) 10 MG 24 HR CAPSULE    Take one tablet once a day for nerves   BUTALBITAL-ACETAMINOPHEN-CAFFEINE (FIORICET) 50-325-40 MG PER TABLET    Take one tablet four times a day as needed for headaches   CHLORPHENIRAMINE-HYDROCODONE (TUSSIONEX) 10-8 MG/5ML SUER    take 5 milliliters every 12 hours if needed for cough . DO NOT DR...  (REFER TO PRESCRIPTION NOTES).   CLONAZEPAM (KLONOPIN) 0.5 MG TABLET    Take one tablet by mouth three times daily as needed   DICYCLOMINE (BENTYL) 20 MG TABLET    One tablet every 6 hours as needed to control pain from irritable bowel syndrome   FLUTICASONE (FLONASE) 50 MCG/ACT NASAL SPRAY    Place 2 sprays into the nose as needed.    HALOBETASOL (ULTRAVATE) 0.05 % CREAM    Apply topically 2 (two) times daily. Apply to rash up to three times a day   HYDROCODONE-ACETAMINOPHEN (NORCO/VICODIN) 5-325 MG TABLET    take 1  tablet by mouth every 6 hours if needed for MODERATE PAIN   LOVASTATIN (MEVACOR) 20 MG TABLET    Take one tablet by mouth at bedtime to control cholesterol   MULTIPLE VITAMIN (MULTIVITAMIN) TABLET    Take 1 tablet by mouth daily.    Modified Medications   No medications on file  Discontinued Medications   CIPROFLOXACIN (CIPRO) 500 MG TABLET    Take 1 tablet (500 mg total) by mouth 2 (two) times daily.   HYDROCODONE-ACETAMINOPHEN (NORCO/VICODIN) 5-325 MG TABLET    Take 1 tablet by mouth every 6 (six) hours as needed for moderate pain.   METRONIDAZOLE (FLAGYL) 500 MG TABLET    Take 1 tablet (500 mg total) by mouth 3 (three) times daily.    Review of Systems  Constitutional: Positive for fatigue.  HENT: Negative for ear pain.        Recurrent sinus congestion. Has seen Dr. Radene Journey several timeS for SINUS infection. Had right tympanostomy tube in July 2015.  Eyes: Positive for visual disturbance (corrective lenses).  Cardiovascular: Negative for chest pain, palpitations and leg swelling.  Gastrointestinal: Negative for nausea, abdominal pain, diarrhea, constipation and abdominal distention.       Intermittent abd spasm and discomfort that is linked to his IBS.  Endocrine: Negative.   Musculoskeletal: Positive for arthralgias.  Skin:       Mild eczema of the  dorsum of the hands  Allergic/Immunologic: Negative.   Neurological: Positive for headaches.  Hematological: Negative.   Psychiatric/Behavioral: Positive for decreased concentration (Hx ADD. Currentl under contro with Adderall.). The patient is nervous/anxious.     Filed Vitals:   11/09/15 1347  BP: 128/78  Pulse: 96  Temp: 98.4 F (36.9 C)  TempSrc: Oral  Resp: 18  Height: _0  (1.778 m)  Weight: 178 lb 9.6 oz (81.012 kg)  SpO2: 98%   Body mass index is 25.63 kg/(m^2).  Physical Exam  Constitutional: He is oriented to person, place, and time. He appears well-developed and well-nourished. No distress.  HENT:  Head:  Normocephalic and atraumatic.  Right Ear: External ear normal.  Left Ear: External ear normal.  Nose: Nose normal.  Mouth/Throat: Oropharynx is clear and moist.  Blue tympanostomy tube right EAC  Eyes: Conjunctivae and EOM are normal. Pupils are equal, round, and reactive to light.  Neck: No JVD present. No tracheal deviation present. No thyromegaly present.  Cardiovascular: Normal rate, regular rhythm and intact distal pulses.  Exam reveals no gallop and no friction rub.   No murmur heard. Pulmonary/Chest: No respiratory distress. He has no wheezes. He has no rales.  Abdominal: He exhibits no distension and no mass. There is no tenderness.  Genitourinary:  .  Musculoskeletal: Normal range of motion. He exhibits no edema or tenderness.  Lymphadenopathy:    He has no cervical adenopathy.  Neurological: He is alert and oriented to person, place, and time. No cranial nerve deficit. Coordination normal.  Skin: No erythema. No pallor.  Mild eczema of the dorsum of the hands  Psychiatric: He has a normal mood and affect. His behavior is normal. Judgment and thought content normal.    Labs reviewed: Lab Summary Latest Ref Rng 11/05/2015 02/18/2015 11/06/2014  Hemoglobin 12.6 - 17.7 g/dL (None) 14.6 (None)  Hematocrit 37.5 - 51.0 % (None) 43.1 (None)  White count 3.4 - 10.8 x10E3/uL (None) 11.5(H) (None)  Platelet count 150 - 379 x10E3/uL (None) 276 (None)  Sodium 136 - 144 mmol/L 138 141 139  Potassium 3.5 - 5.2 mmol/L 4.8 4.4 4.6  Calcium 8.7 - 10.2 mg/dL 9.4 8.9 8.8  Phosphorus - (None) (None) (None)  Creatinine 0.76 - 1.27 mg/dL 1.52(H) 1.08 0.95  AST 0 - 40 IU/L _1 Alk Phos 39 - 117 IU/L 82 60 61  Bilirubin 0.0 - 1.2 mg/dL 0.4 0.2 0.2  Glucose 65 - 99 mg/dL 79 107(H) 103(H)  Cholesterol - (None) (None) (None)  HDL cholesterol >39 mg/dL 57 (None) 75  Triglycerides 0 - 149 mg/dL 63 (None) 54  LDL Direct - (None) (None) (None)  LDL Calc 0 - 99 mg/dL 86 (None) 100(H)  Total  protein - (None) (None) (None)  Albumin 3.5 - 5.5 g/dL 3.8 4.6 4.3   No results found for: TSH, T3TOTAL, T4TOTAL, THYROIDAB Lab Results  Component Value Date   BUN 26* 11/05/2015   No results found for: HGBA1C  Assessment/Plan  1. Irritable bowel syndrome (IBS)  Chronic and under control  2. ADD (attention deficit disorder)  Continue Adderall  - clonazePAM (KLONOPIN) 0.5 MG tablet; Take one tablet by mouth three times daily as needed  Dispense: 90 tablet; Refill: 1  3. Hyperlipidemia  Controlled  - Lipid panel; Future  4. Benign essential HTN  Controlled  - Comprehensive metabolic panel; Future  5. Hyperglycemia - Comprehensive metabolic panel; Future  6. Eczema - halobetasol (ULTRAVATE) 0.05 % cream; Apply topically 2 (  two) times daily. Apply to rash up to three times a day  Dispense: 15 g; Refill: 5

## 2015-11-09 NOTE — Patient Instructions (Signed)
Try antihistamine daily to reduce congestion.

## 2015-12-01 ENCOUNTER — Other Ambulatory Visit: Payer: Self-pay

## 2015-12-01 MED ORDER — AMPHETAMINE-DEXTROAMPHET ER 10 MG PO CP24: ORAL_CAPSULE | ORAL | Status: DC

## 2015-12-31 ENCOUNTER — Other Ambulatory Visit: Payer: Self-pay | Admitting: *Deleted

## 2015-12-31 MED ORDER — AMPHETAMINE-DEXTROAMPHET ER 10 MG PO CP24: ORAL_CAPSULE | ORAL | Status: DC

## 2015-12-31 NOTE — Telephone Encounter (Signed)
Patient requested and will pick up 

## 2016-01-31 ENCOUNTER — Other Ambulatory Visit: Payer: Self-pay | Admitting: *Deleted

## 2016-01-31 MED ORDER — AMPHETAMINE-DEXTROAMPHET ER 10 MG PO CP24: ORAL_CAPSULE | ORAL | Status: DC

## 2016-01-31 NOTE — Telephone Encounter (Signed)
Patient requested and will pick up 

## 2016-03-01 ENCOUNTER — Other Ambulatory Visit: Payer: Self-pay | Admitting: *Deleted

## 2016-03-01 MED ORDER — AMPHETAMINE-DEXTROAMPHET ER 10 MG PO CP24: ORAL_CAPSULE | ORAL | Status: DC

## 2016-03-01 NOTE — Telephone Encounter (Signed)
Patient requested and will pick up 

## 2016-03-31 ENCOUNTER — Other Ambulatory Visit: Payer: Self-pay | Admitting: *Deleted

## 2016-03-31 MED ORDER — AMPHETAMINE-DEXTROAMPHET ER 10 MG PO CP24: ORAL_CAPSULE | ORAL | Status: DC

## 2016-03-31 MED ORDER — AMPHETAMINE-DEXTROAMPHET ER 10 MG PO CP24
ORAL_CAPSULE | ORAL | Status: DC
Start: 2016-03-31 — End: 2016-03-31

## 2016-03-31 NOTE — Telephone Encounter (Signed)
Last filled 03/01/2016

## 2016-04-03 ENCOUNTER — Other Ambulatory Visit: Payer: Self-pay | Admitting: *Deleted

## 2016-04-03 MED ORDER — DICYCLOMINE HCL 20 MG PO TABS: ORAL_TABLET | ORAL | Status: DC

## 2016-04-03 NOTE — Telephone Encounter (Signed)
[facility]

## 2016-05-01 ENCOUNTER — Other Ambulatory Visit: Payer: Self-pay | Admitting: *Deleted

## 2016-05-01 MED ORDER — AMPHETAMINE-DEXTROAMPHET ER 10 MG PO CP24: ORAL_CAPSULE | ORAL | Status: DC

## 2016-05-01 NOTE — Telephone Encounter (Signed)
Patient requested and will pick up 

## 2016-05-05 ENCOUNTER — Other Ambulatory Visit: Payer: BLUE CROSS/BLUE SHIELD

## 2016-05-05 DIAGNOSIS — I10 Essential (primary) hypertension: Secondary | ICD-10-CM

## 2016-05-05 DIAGNOSIS — R739 Hyperglycemia, unspecified: Secondary | ICD-10-CM

## 2016-05-05 DIAGNOSIS — E785 Hyperlipidemia, unspecified: Secondary | ICD-10-CM

## 2016-05-06 LAB — COMPREHENSIVE METABOLIC PANEL
ALK PHOS: 51 IU/L (ref 39–117)
ALT: 25 IU/L (ref 0–44)
AST: 22 IU/L (ref 0–40)
Albumin/Globulin Ratio: 2.3 — ABNORMAL HIGH (ref 1.2–2.2)
Albumin: 4.3 g/dL (ref 3.5–5.5)
BUN / CREAT RATIO: 9 (ref 9–20)
BUN: 9 mg/dL (ref 6–24)
Bilirubin Total: 0.3 mg/dL (ref 0.0–1.2)
CO2: 23 mmol/L (ref 18–29)
CREATININE: 1.03 mg/dL (ref 0.76–1.27)
Calcium: 8.8 mg/dL (ref 8.7–10.2)
Chloride: 101 mmol/L (ref 96–106)
GFR, EST AFRICAN AMERICAN: 92 mL/min/{1.73_m2} (ref >59)
GFR, EST NON AFRICAN AMERICAN: 80 mL/min/{1.73_m2} (ref >59)
GLOBULIN, TOTAL: 1.9 g/dL (ref 1.5–4.5)
GLUCOSE: 94 mg/dL (ref 65–99)
Potassium: 4.2 mmol/L (ref 3.5–5.2)
SODIUM: 138 mmol/L (ref 134–144)
TOTAL PROTEIN: 6.2 g/dL (ref 6.0–8.5)

## 2016-05-06 LAB — LIPID PANEL
Chol/HDL Ratio: 2.1 ratio units (ref 0.0–5.0)
Cholesterol, Total: 136 mg/dL (ref 100–199)
HDL: 64 mg/dL (ref >39)
LDL CALC: 64 mg/dL (ref 0–99)
Triglycerides: 41 mg/dL (ref 0–149)
VLDL CHOLESTEROL CAL: 8 mg/dL (ref 5–40)

## 2016-05-09 ENCOUNTER — Encounter: Payer: Self-pay | Admitting: Internal Medicine

## 2016-05-09 ENCOUNTER — Ambulatory Visit (INDEPENDENT_AMBULATORY_CARE_PROVIDER_SITE_OTHER): Payer: BLUE CROSS/BLUE SHIELD | Admitting: Internal Medicine

## 2016-05-09 VITALS — BP 138/80 | HR 81 | Temp 98.5°F | Resp 20 | Ht 70.0 in | Wt 184.8 lb

## 2016-05-09 DIAGNOSIS — Z125 Encounter for screening for malignant neoplasm of prostate: Secondary | ICD-10-CM | POA: Diagnosis not present

## 2016-05-09 DIAGNOSIS — F988 Other specified behavioral and emotional disorders with onset usually occurring in childhood and adolescence: Secondary | ICD-10-CM

## 2016-05-09 DIAGNOSIS — I10 Essential (primary) hypertension: Secondary | ICD-10-CM | POA: Diagnosis not present

## 2016-05-09 DIAGNOSIS — F909 Attention-deficit hyperactivity disorder, unspecified type: Secondary | ICD-10-CM | POA: Diagnosis not present

## 2016-05-09 DIAGNOSIS — R739 Hyperglycemia, unspecified: Secondary | ICD-10-CM

## 2016-05-09 DIAGNOSIS — L309 Dermatitis, unspecified: Secondary | ICD-10-CM

## 2016-05-09 DIAGNOSIS — K589 Irritable bowel syndrome without diarrhea: Secondary | ICD-10-CM | POA: Diagnosis not present

## 2016-05-09 DIAGNOSIS — E785 Hyperlipidemia, unspecified: Secondary | ICD-10-CM

## 2016-05-09 DIAGNOSIS — G44221 Chronic tension-type headache, intractable: Secondary | ICD-10-CM

## 2016-05-09 DIAGNOSIS — Z1159 Encounter for screening for other viral diseases: Secondary | ICD-10-CM | POA: Diagnosis not present

## 2016-05-09 MED ORDER — BUTALBITAL-APAP-CAFFEINE 50-325-40 MG PO TABS: ORAL_TABLET | ORAL | Status: DC

## 2016-05-09 NOTE — Progress Notes (Signed)
Patient ID: Connor [name], male   DOB: 14-Oct-1956, 60 y.o.   MRN: [id number]    Facility  [facility]    Place of Service:   OFFICE    Allergies  Allergen Reactions  . Amitriptyline   . Zolpidem Tartrate Other (See Comments)    Severe headache  . Amoxicillin-Pot Clavulanate Rash  . Propoxyphene Hcl Nausea Only    Chief Complaint  Patient presents with  . Medical Management of Chronic Issues    6 mo f/u w/labs    HPI:   IBS for about a week. Was taking OsteoBiflex and the stools normalized after that.  ADD stable on Adderall. Gets a few palpitations on Adderall.  HTN controlled  Glucose normal.  Pains in the right shoulder and right hip. Right thumb at MCP joint.  Medications: Patient's Medications  New Prescriptions   No medications on file  Previous Medications   AMPHETAMINE-DEXTROAMPHETAMINE (ADDERALL XR) 10 MG 24 HR CAPSULE    Take one tablet once a day for nerves   CLONAZEPAM (KLONOPIN) 0.5 MG TABLET    Take one tablet by mouth three times daily as needed   DICYCLOMINE (BENTYL) 20 MG TABLET    Take One tablet by mouth every 6 hours as needed to control pain from irritable bowel syndrome   FLUTICASONE (FLONASE) 50 MCG/ACT NASAL SPRAY    Place 2 sprays into the nose as needed.    HALOBETASOL (ULTRAVATE) 0.05 % CREAM    Apply topically 2 (two) times daily. Apply to rash up to three times a day   LOVASTATIN (MEVACOR) 20 MG TABLET    Take one tablet by mouth at bedtime to control cholesterol   MULTIPLE VITAMIN (MULTIVITAMIN) TABLET    Take 1 tablet by mouth daily.    Modified Medications   Modified Medication Previous Medication   BUTALBITAL-ACETAMINOPHEN-CAFFEINE (FIORICET) 50-325-40 MG TABLET butalbital-acetaminophen-caffeine (FIORICET) 50-325-40 MG per tablet      Take one tablet four times a day as needed for headaches    Take one tablet four times a day as needed for headaches  Discontinued Medications   CHLORPHENIRAMINE-HYDROCODONE (TUSSIONEX) 10-8 MG/5ML SUER     take 5 milliliters every 12 hours if needed for cough . DO NOT DR...  (REFER TO PRESCRIPTION NOTES).   HYDROCODONE-ACETAMINOPHEN (NORCO/VICODIN) 5-325 MG TABLET    take 1 tablet by mouth every 6 hours if needed for MODERATE PAIN    Review of Systems  Constitutional: Positive for fatigue.  HENT: Negative for ear pain.        Recurrent sinus congestion. Has seen Dr. Radene Journey several timeS for SINUS infection. Had right tympanostomy tube in July 2015.  Eyes: Positive for visual disturbance (corrective lenses).  Cardiovascular: Negative for chest pain, palpitations and leg swelling.  Gastrointestinal: Negative for nausea, abdominal pain, diarrhea, constipation and abdominal distention.       Intermittent abd spasm and discomfort that is linked to his IBS.  Endocrine: Negative.   Musculoskeletal: Positive for arthralgias.  Skin:       Mild eczema of the dorsum of the hands  Allergic/Immunologic: Negative.   Neurological: Positive for headaches.  Hematological: Negative.   Psychiatric/Behavioral: Positive for decreased concentration (Hx ADD. Currentl under contro with Adderall.). The patient is nervous/anxious.     Filed Vitals:   05/09/16 1352  BP: 138/80  Pulse: 81  Temp: 98.5 F (36.9 C)  TempSrc: Oral  Resp: 20  Height: '5\' 10"'  (1.778 m)  Weight: 184 lb 12.8 oz (83.825  kg)  SpO2: 97%   Body mass index is 26.52 kg/(m^2). Filed Weights   05/09/16 1352  Weight: 184 lb 12.8 oz (83.825 kg)     Physical Exam  Constitutional: He is oriented to person, place, and time. He appears well-developed and well-nourished. No distress.  HENT:  Head: Normocephalic and atraumatic.  Right Ear: External ear normal.  Left Ear: External ear normal.  Nose: Nose normal.  Mouth/Throat: Oropharynx is clear and moist.  Blue tympanostomy tube right EAC  Eyes: Conjunctivae and EOM are normal. Pupils are equal, round, and reactive to light.  Neck: No JVD present. No tracheal deviation present.  No thyromegaly present.  Cardiovascular: Normal rate, regular rhythm and intact distal pulses.  Exam reveals no gallop and no friction rub.   No murmur heard. Pulmonary/Chest: No respiratory distress. He has no wheezes. He has no rales.  Abdominal: He exhibits no distension and no mass. There is no tenderness.  Genitourinary:  .  Musculoskeletal: Normal range of motion. He exhibits no edema or tenderness.  Lymphadenopathy:    He has no cervical adenopathy.  Neurological: He is alert and oriented to person, place, and time. No cranial nerve deficit. Coordination normal.  Skin: No erythema. No pallor.  Mild eczema of the dorsum of the hands  Psychiatric: He has a normal mood and affect. His behavior is normal. Judgment and thought content normal.    Labs reviewed: Lab Summary Latest Ref Rng 05/05/2016 11/05/2015 02/18/2015  Hemoglobin 12.6 - 17.7 g/dL (None) (None) 14.6  Hematocrit 37.5 - 51.0 % (None) (None) 43.1  White count 3.4 - 10.8 x10E3/uL (None) (None) 11.5(H)  Platelet count 150 - 379 x10E3/uL (None) (None) 276  Sodium 134 - 144 mmol/L 138 138 141  Potassium 3.5 - 5.2 mmol/L 4.2 4.8 4.4  Calcium 8.7 - 10.2 mg/dL 8.8 9.4 8.9  Phosphorus - (None) (None) (None)  Creatinine 0.76 - 1.27 mg/dL 1.03 1.52(H) 1.08  AST 0 - 40 IU/L '22 14 27  ' Alk Phos 39 - 117 IU/L 51 82 60  Bilirubin 0.0 - 1.2 mg/dL 0.3 0.4 0.2  Glucose 65 - 99 mg/dL 94 79 107(H)  Cholesterol - (None) (None) (None)  HDL cholesterol >39 mg/dL 64 57 (None)  Triglycerides 0 - 149 mg/dL 41 63 (None)  LDL Direct - (None) (None) (None)  LDL Calc 0 - 99 mg/dL 64 86 (None)  Total protein - (None) (None) (None)  Albumin 3.5 - 5.5 g/dL 4.3 3.8 4.6   No results found for: TSH, T3TOTAL, T4TOTAL, THYROIDAB Lab Results  Component Value Date   BUN 9 05/05/2016   BUN 26* 11/05/2015   BUN 14 02/18/2015   No results found for: HGBA1C  Assessment/Plan  1. Chronic tension-type headache, intractable -  butalbital-acetaminophen-caffeine (FIORICET) 50-325-40 MG tablet; Take one tablet four times a day as needed for headaches  Dispense: 120 tablet; Refill: 5  2. Need for hepatitis C screening test Patient request - Hep C Antibody; Future  3. ADD (attention deficit disorder) Stable on current Adderall  4. Benign essential HTN controlled - Comprehensive metabolic panel; Future  5. Eczema improved  6. Hyperglycemia Normal recent glucose - Comprehensive metabolic panel; Future  7. Irritable bowel syndrome (IBS) recurrent issues  8. Hyperlipidemia - Lipid panel; Future  9. Screening for prostate cancer - PSA; Future

## 2016-05-31 ENCOUNTER — Other Ambulatory Visit: Payer: Self-pay | Admitting: *Deleted

## 2016-05-31 MED ORDER — AMPHETAMINE-DEXTROAMPHET ER 10 MG PO CP24: ORAL_CAPSULE | ORAL | Status: DC

## 2016-05-31 NOTE — Telephone Encounter (Signed)
Patient requested and will pick up 

## 2016-06-01 ENCOUNTER — Telehealth: Payer: Self-pay

## 2016-06-01 NOTE — Telephone Encounter (Signed)
Patient called to find out if his refill request for his mediation had been signed. He had been told someone would call when it was ready, I informed him it was ready for pick up.

## 2016-06-30 ENCOUNTER — Other Ambulatory Visit: Payer: Self-pay

## 2016-06-30 MED ORDER — AMPHETAMINE-DEXTROAMPHET ER 10 MG PO CP24: ORAL_CAPSULE | ORAL | Status: DC

## 2016-07-31 ENCOUNTER — Other Ambulatory Visit: Payer: Self-pay

## 2016-07-31 MED ORDER — AMPHETAMINE-DEXTROAMPHET ER 10 MG PO CP24: ORAL_CAPSULE | ORAL | 0 refills | Status: DC

## 2016-07-31 NOTE — Telephone Encounter (Signed)
Spoke with patient and advised rx ready for pick-up and it will be at the front desk.  

## 2016-08-16 ENCOUNTER — Other Ambulatory Visit: Payer: Self-pay | Admitting: Internal Medicine

## 2016-08-16 DIAGNOSIS — F988 Other specified behavioral and emotional disorders with onset usually occurring in childhood and adolescence: Secondary | ICD-10-CM

## 2016-08-25 ENCOUNTER — Other Ambulatory Visit: Payer: Self-pay

## 2016-08-25 DIAGNOSIS — Z125 Encounter for screening for malignant neoplasm of prostate: Secondary | ICD-10-CM

## 2016-08-25 DIAGNOSIS — Z1159 Encounter for screening for other viral diseases: Secondary | ICD-10-CM

## 2016-08-25 DIAGNOSIS — I10 Essential (primary) hypertension: Secondary | ICD-10-CM

## 2016-08-25 DIAGNOSIS — E785 Hyperlipidemia, unspecified: Secondary | ICD-10-CM

## 2016-08-30 ENCOUNTER — Other Ambulatory Visit: Payer: Self-pay | Admitting: *Deleted

## 2016-08-30 MED ORDER — AMPHETAMINE-DEXTROAMPHET ER 10 MG PO CP24: ORAL_CAPSULE | ORAL | 0 refills | Status: DC

## 2016-08-30 NOTE — Telephone Encounter (Signed)
Patient requested and will pick up 

## 2016-09-29 ENCOUNTER — Other Ambulatory Visit: Payer: Self-pay | Admitting: *Deleted

## 2016-09-29 MED ORDER — AMPHETAMINE-DEXTROAMPHET ER 10 MG PO CP24: ORAL_CAPSULE | ORAL | 0 refills | Status: DC

## 2016-09-29 NOTE — Telephone Encounter (Signed)
Patient requested and will pick up 

## 2016-10-22 IMAGING — CT CT ABD-PELV W/ CM
3 of 5 series · 12 of 36 positions shown, 18 images · IV contrast (READICAT/WATER & [ID] OMNI 300)
Comparison: 04/10/2013

CLINICAL DATA: Left lower quadrant pain, abdominal cramping, on
antibiotics for diverticulitis. Prior appendectomy and bilateral
inguinal hernia repair.

EXAM:
CT ABDOMEN AND PELVIS WITH CONTRAST
TECHNIQUE: Multidetector CT imaging of the abdomen and pelvis was performed
using the standard protocol following bolus administration of
intravenous contrast.
CONTRAST:  100mL OMNIPAQUE IOHEXOL 300 MG/ML  SOLN

[Series 3: abd/pelvis with · axial · 0.74mm/px · z∈[-392,-82]mm · 7 of 84 slices shown, 12 images]
[im 11/84  soft-tissue]
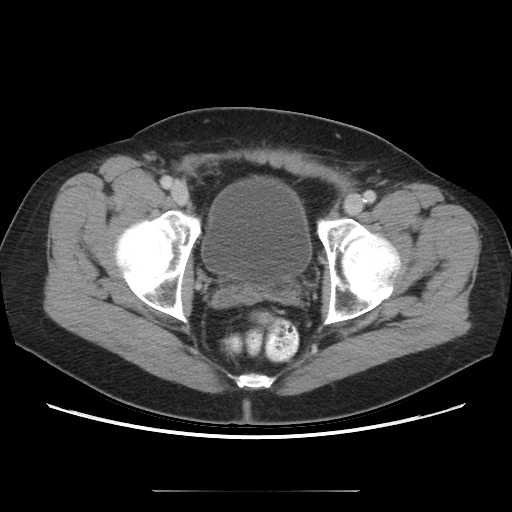
[im 11/84  bone]
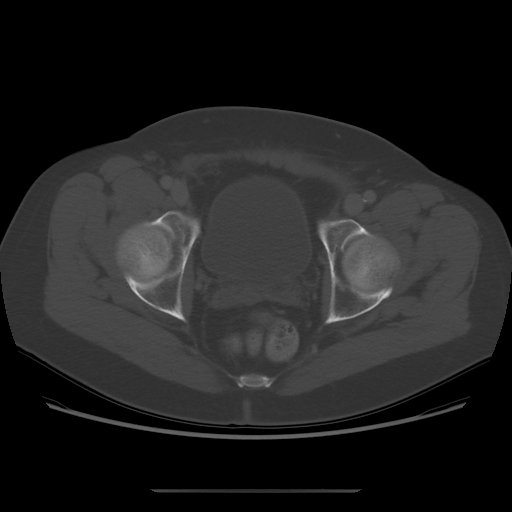
[im 21/84  soft-tissue]
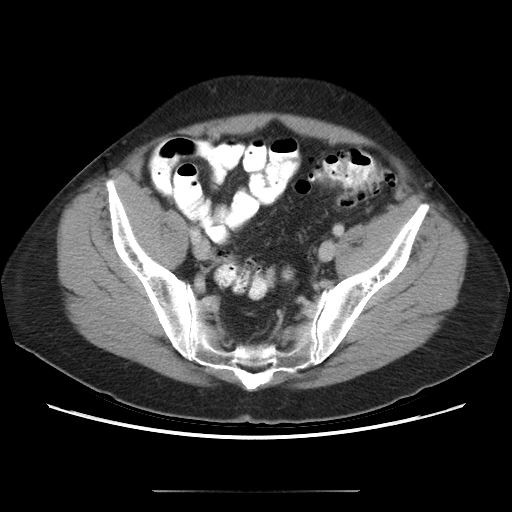
[im 32/84  soft-tissue]
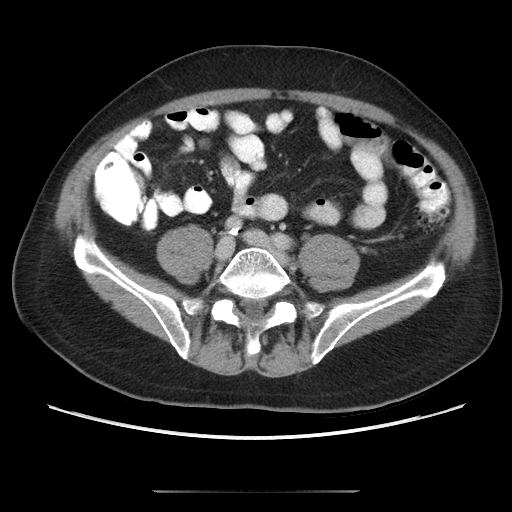
[im 42/84  soft-tissue]
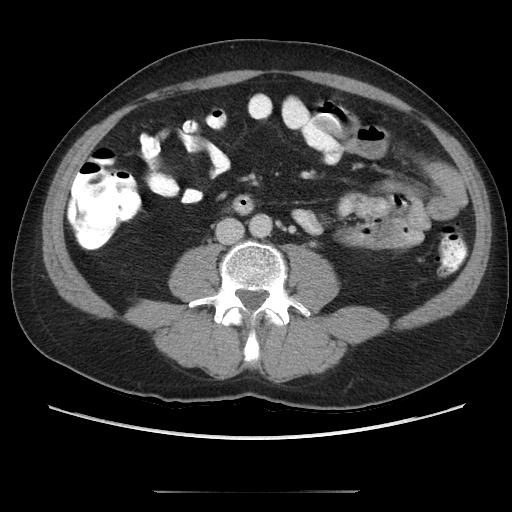
[im 42/84  lung]
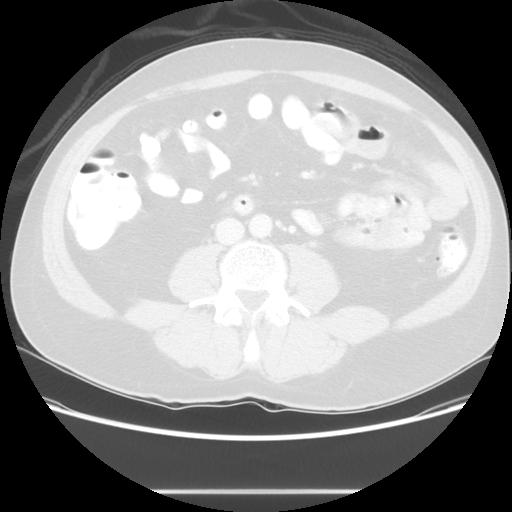
[im 52/84  soft-tissue]
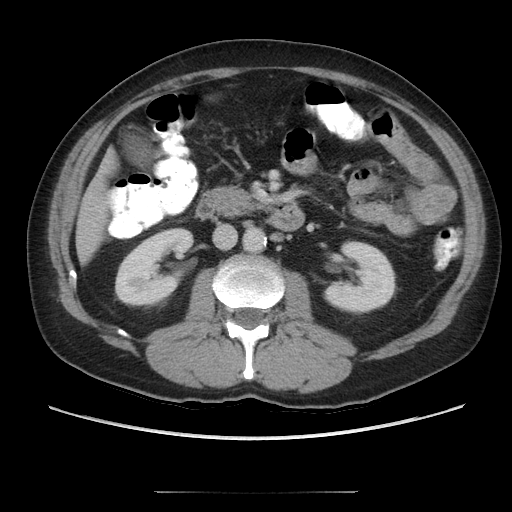
[im 52/84  lung]
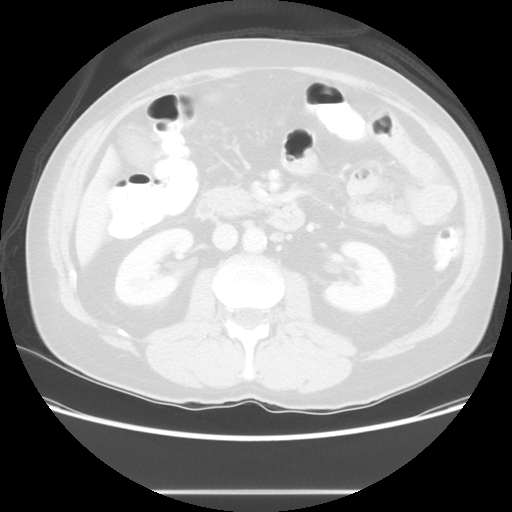
[im 63/84  soft-tissue]
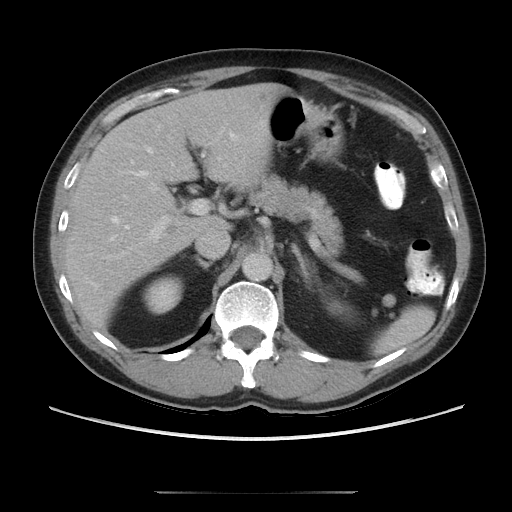
[im 63/84  lung]
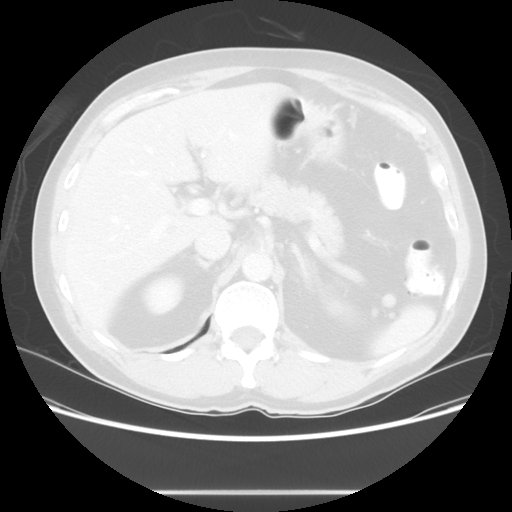
[im 73/84  soft-tissue]
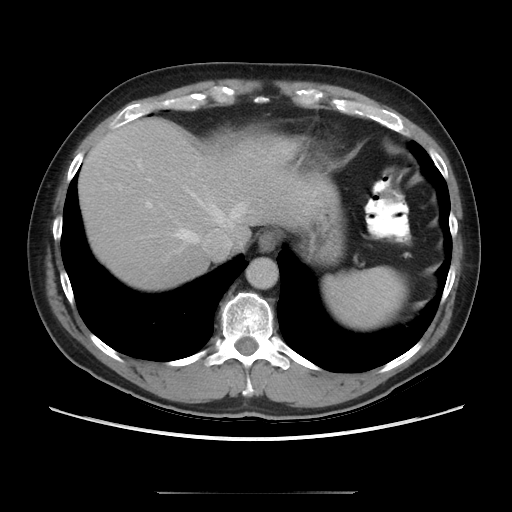
[im 73/84  lung]
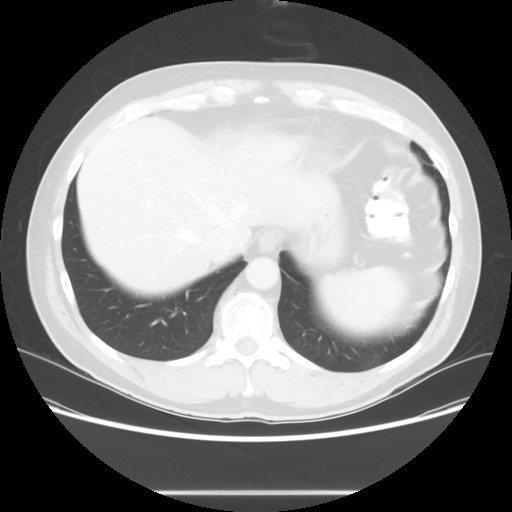

[Series 601: coronal body · coronal · 0.92mm/px · 1 of 118 slices shown, 2 images]
[im 40/118  soft-tissue]
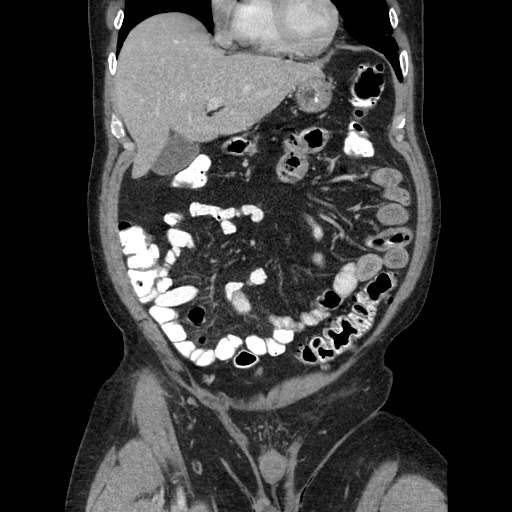
[im 40/118  bone]
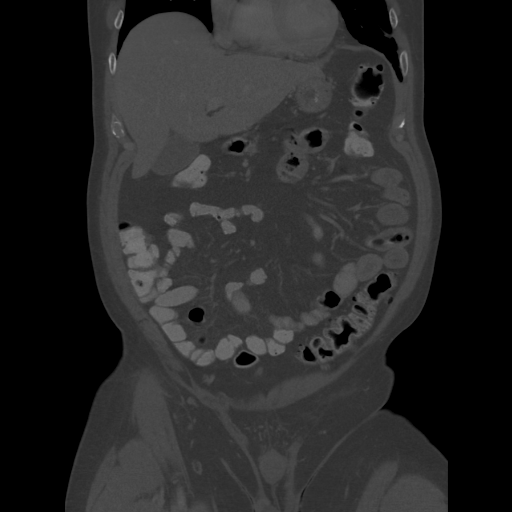

[Series 602: sagittal body · sagittal · 0.92mm/px · 4 of 153 slices shown]
[im 10/153  soft-tissue]
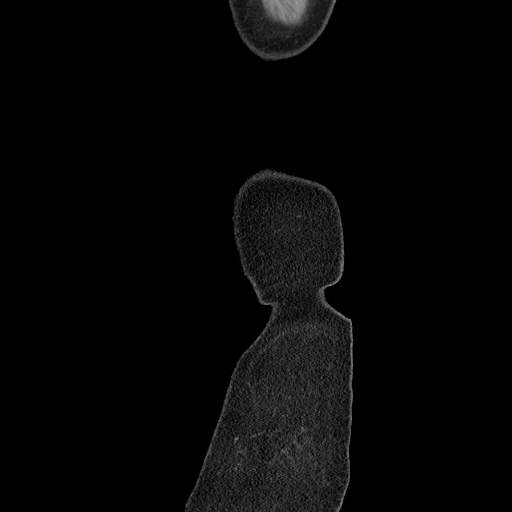
[im 29/153  soft-tissue]
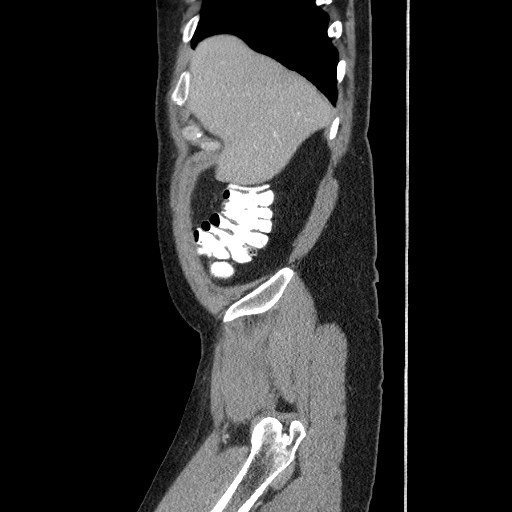
[im 48/153  soft-tissue]
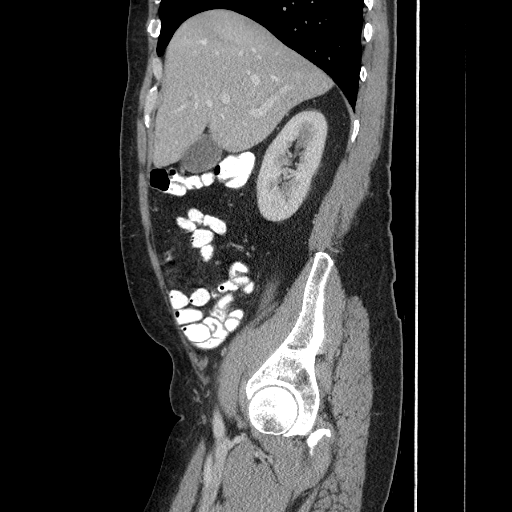
[im 67/153  soft-tissue]
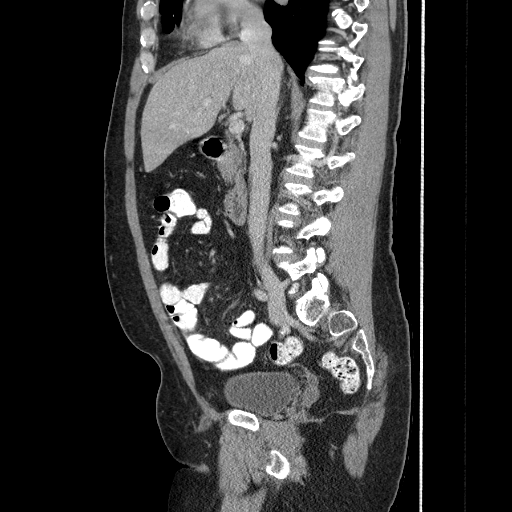

[12 of 36 positions shown; findings below may reference images not displayed]

FINDINGS: Lower chest: 4 mm nodule the medial left lung base (series 4/image
8), unchanged from 8020, benign.

Hepatobiliary: Scattered hepatic cysts measuring up to 16 mm in the
central right liver (series 3/ image 14).

Tiny adherent gallstone (series 3/ image 30). No intrahepatic or
extrahepatic ductal dilatation.

Pancreas: Within normal limits.

Spleen: Within normal limits.

Adrenals/Urinary Tract: Adrenal glands are within normal limits.

1.3 cm anterior right lower pole renal cyst (series 3/image 36).
Left kidney is within normal limits. No hydronephrosis.

Bladder is within normal limits.

Stomach/Bowel: Stomach is within normal limits.

No evidence of bowel obstruction.  Prior appendectomy.

Colonic diverticulosis, with minimal stranding along the descending
colon (series 3/image 49), but without convincing findings to
suggest acute diverticulitis.

No drainable fluid collection/ abscess.  No free air.

Vascular/Lymphatic: Mild atherosclerotic calcifications of the
abdominal aorta and branch vessels.

No suspicious abdominopelvic lymphadenopathy.

Reproductive: Prostate is unremarkable.

Other: No abdominopelvic ascites.

Postsurgical changes in the bilateral inguinal regions.

Musculoskeletal: Mild degenerative changes of the lower thoracic
spine.
IMPRESSION: Colonic diverticulosis, with minimal stranding along the descending
colon, possibly reflecting recent diverticulitis.

No convincing findings to suggest acute diverticulitis on the
current CT.

No drainable fluid collection/abscess.  No free air.

## 2016-10-23 ENCOUNTER — Other Ambulatory Visit: Payer: Self-pay | Admitting: Internal Medicine

## 2016-10-30 ENCOUNTER — Other Ambulatory Visit: Payer: Self-pay | Admitting: *Deleted

## 2016-10-30 MED ORDER — AMPHETAMINE-DEXTROAMPHET ER 10 MG PO CP24: ORAL_CAPSULE | ORAL | 0 refills | Status: DC

## 2016-10-30 NOTE — Telephone Encounter (Signed)
Patient requested and will pick up 

## 2016-11-10 ENCOUNTER — Other Ambulatory Visit: Payer: BLUE CROSS/BLUE SHIELD

## 2016-11-10 DIAGNOSIS — I10 Essential (primary) hypertension: Secondary | ICD-10-CM

## 2016-11-10 DIAGNOSIS — Z125 Encounter for screening for malignant neoplasm of prostate: Secondary | ICD-10-CM

## 2016-11-10 DIAGNOSIS — E785 Hyperlipidemia, unspecified: Secondary | ICD-10-CM

## 2016-11-10 DIAGNOSIS — Z1159 Encounter for screening for other viral diseases: Secondary | ICD-10-CM

## 2016-11-10 LAB — COMPLETE METABOLIC PANEL WITH GFR
ALBUMIN: 4.6 g/dL (ref 3.6–5.1)
ALK PHOS: 72 U/L (ref 40–115)
ALT: 39 U/L (ref 9–46)
AST: 20 U/L (ref 10–35)
BILIRUBIN TOTAL: 0.3 mg/dL (ref 0.2–1.2)
BUN: 14 mg/dL (ref 7–25)
CO2: 23 mmol/L (ref 20–31)
Calcium: 9.4 mg/dL (ref 8.6–10.3)
Chloride: 102 mmol/L (ref 98–110)
Creat: 1.02 mg/dL (ref 0.70–1.33)
GFR, EST NON AFRICAN AMERICAN: 80 mL/min (ref >=60)
GLUCOSE: 99 mg/dL (ref 65–99)
Potassium: 4.7 mmol/L (ref 3.5–5.3)
SODIUM: 138 mmol/L (ref 135–146)
TOTAL PROTEIN: 6.9 g/dL (ref 6.1–8.1)

## 2016-11-10 LAB — LIPID PANEL
Cholesterol: 195 mg/dL (ref <200)
HDL: 72 mg/dL (ref >40)
LDL Cholesterol: 102 mg/dL — ABNORMAL HIGH (ref <100)
Total CHOL/HDL Ratio: 2.7 Ratio (ref <5.0)
Triglycerides: 106 mg/dL (ref <150)
VLDL: 21 mg/dL (ref <30)

## 2016-11-10 LAB — PSA: PSA: 3.2 ng/mL (ref <=4.0)

## 2016-11-11 LAB — HEPATITIS C ANTIBODY: HCV AB: NEGATIVE

## 2016-11-14 ENCOUNTER — Ambulatory Visit (INDEPENDENT_AMBULATORY_CARE_PROVIDER_SITE_OTHER): Payer: BLUE CROSS/BLUE SHIELD | Admitting: Internal Medicine

## 2016-11-14 ENCOUNTER — Encounter: Payer: Self-pay | Admitting: Internal Medicine

## 2016-11-14 VITALS — BP 160/82 | HR 80 | Temp 98.6°F | Ht 70.0 in | Wt 178.0 lb

## 2016-11-14 DIAGNOSIS — L309 Dermatitis, unspecified: Secondary | ICD-10-CM | POA: Diagnosis not present

## 2016-11-14 DIAGNOSIS — J209 Acute bronchitis, unspecified: Secondary | ICD-10-CM | POA: Diagnosis not present

## 2016-11-14 DIAGNOSIS — G44221 Chronic tension-type headache, intractable: Secondary | ICD-10-CM

## 2016-11-14 DIAGNOSIS — E785 Hyperlipidemia, unspecified: Secondary | ICD-10-CM

## 2016-11-14 DIAGNOSIS — F902 Attention-deficit hyperactivity disorder, combined type: Secondary | ICD-10-CM | POA: Diagnosis not present

## 2016-11-14 DIAGNOSIS — I10 Essential (primary) hypertension: Secondary | ICD-10-CM

## 2016-11-14 DIAGNOSIS — H7391 Unspecified disorder of tympanic membrane, right ear: Secondary | ICD-10-CM | POA: Diagnosis not present

## 2016-11-14 DIAGNOSIS — R739 Hyperglycemia, unspecified: Secondary | ICD-10-CM

## 2016-11-14 DIAGNOSIS — Z630 Problems in relationship with spouse or partner: Secondary | ICD-10-CM | POA: Diagnosis not present

## 2016-11-14 MED ORDER — CLONAZEPAM 0.5 MG PO TABS: ORAL_TABLET | ORAL | 0 refills | Status: DC
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
## 2016-11-14 NOTE — Patient Instructions (Signed)
Try Delsym for cough 

## 2016-11-29 ENCOUNTER — Other Ambulatory Visit: Payer: Self-pay | Admitting: *Deleted

## 2016-11-29 MED ORDER — AMPHETAMINE-DEXTROAMPHET ER 10 MG PO CP24: ORAL_CAPSULE | ORAL | 0 refills | Status: DC

## 2016-11-29 NOTE — Telephone Encounter (Signed)
Patient requested and will pick up 

## 2016-12-29 ENCOUNTER — Other Ambulatory Visit: Payer: Self-pay | Admitting: *Deleted

## 2016-12-29 MED ORDER — AMPHETAMINE-DEXTROAMPHET ER 10 MG PO CP24: ORAL_CAPSULE | ORAL | 0 refills | Status: DC

## 2016-12-29 NOTE — Telephone Encounter (Signed)
Patient requested and will pick up 

## 2017-01-02 ENCOUNTER — Ambulatory Visit (INDEPENDENT_AMBULATORY_CARE_PROVIDER_SITE_OTHER): Payer: BLUE CROSS/BLUE SHIELD | Admitting: Nurse Practitioner

## 2017-01-02 ENCOUNTER — Encounter: Payer: Self-pay | Admitting: Nurse Practitioner

## 2017-01-02 VITALS — BP 130/84 | HR 80 | Temp 98.2°F | Resp 17 | Ht 70.0 in | Wt 184.4 lb

## 2017-01-02 DIAGNOSIS — J0191 Acute recurrent sinusitis, unspecified: Secondary | ICD-10-CM | POA: Diagnosis not present

## 2017-01-02 MED ORDER — LEVOFLOXACIN 500 MG PO TABS: 500.0000 mg | ORAL_TABLET | Freq: Every day | ORAL | 0 refills | Status: DC

## 2017-01-02 NOTE — Progress Notes (Signed)
Careteam: Patient Care Team: Kimber Relic, MD as PCP - General (Internal Medicine) Drema Halon, MD as Consulting Physician (Otolaryngology) Violeta Gelinas, MD as Consulting Physician (General Surgery) Jethro Bolus, MD as Consulting Physician (Ophthalmology) Meryl Dare, MD as Consulting Physician (Gastroenterology) Emelia Loron, MD as Consulting Physician (General Surgery) Nita Sells, MD (Dermatology)  Advanced Directive information Does Patient Have a Medical Advance Directive?: Yes, Type of Advance Directive: Healthcare Power of Union;Living will  Allergies  Allergen Reactions  . Amitriptyline   . Zolpidem Tartrate Other (See Comments)    Severe headache  . Amoxicillin-Pot Clavulanate Rash  . Propoxyphene Hcl Nausea Only    Chief Complaint  Patient presents with  . Acute Visit     3rd sinus infection since October; congestion, facial pain/pressure, drainage x 1 week.   . Other    Pt uses sinus flush twice daily since sinus surgery in 2011     HPI: Patient is a 61 y.o. male seen in the office today with reports that this is her 3rd sinus infection since October. For the previous sinus infections the patient saw the NP at his place of employment and received Levaquin, which was effective in relieving his symptoms. Last antibiotic use in November. Today his symptoms include nasal congestion, facial pain/pressure, and postnasal drip began 1 week ago. When he blows his nose he does get thick green mucus out at times. Treatments include sinus flushes twice a day, Flonase, and Sudafed. The patient reports normally these treatments are effective but at this time they are not. She had sinus surgery in 2011 in which he had polyps removed. Dr. Ezzard Standing, ENT, did the sinus surgery - the patient was going to see him this week but he did not have any openings.  Having a lot of pressure on right side. Saline does not flush through easily on right side. A lot of pain and  pressure.    Review of Systems:  Review of Systems  Constitutional: Negative for chills and fever.  HENT: Positive for congestion (nasal), postnasal drip, sinus pain and sinus pressure. Negative for rhinorrhea, sneezing and sore throat.   Respiratory: Negative for cough, shortness of breath and wheezing.     Past Medical History:  Diagnosis Date  . Abdominal pain, left lower quadrant   . Actinic keratosis   . ADD (attention deficit disorder)   . Alcohol abuse, unspecified   . Anxiety   . Anxiety state, unspecified   . Arthritis    shoulders and hips  . Atrial fibrillation (HCC)   . Attention deficit disorder without mention of hyperactivity   . Cervicalgia   . Depressive disorder, not elsewhere classified   . Eczema 11/2011   right hand  . Edema   . Encounter for long-term (current) use of other medications   . Headache(784.0)    tension or sinus  . High cholesterol   . Inguinal hernia 11/2011   right  . Inguinal hernia without mention of obstruction or gangrene, unilateral or unspecified, (not specified as recurrent)   . Insomnia, unspecified   . Internal hemorrhoids without mention of complication   . Irritable bowel syndrome   . Irritable bowel syndrome (IBS)   . Lumbago   . Major depressive disorder, recurrent episode, severe, without mention of psychotic behavior   . Obesity, unspecified   . Other and unspecified hyperlipidemia   . Other atopic dermatitis and related conditions   . Other disorders of vitreous   .  Other seborrheic keratosis   . Other specified visual disturbances   . Pain in joint, pelvic region and thigh   . Pain in joint, site unspecified   . Pathologic fracture of vertebrae   . Poisoning and toxic reactions caused by other specified animals and plants   . Recent retinal detachment, partial, with single defect   . Routine general medical examination at a health care facility   . Sciatica   . Sinus infection 11/2011   started antibiotic  12/18/2011 x 7 days; current cough  . Special screening for malignant neoplasm of prostate   . Tension headache   . Type II or unspecified type diabetes mellitus without mention of complication, not stated as uncontrolled   . Unspecified essential hypertension    Past Surgical History:  Procedure Laterality Date  . APPENDECTOMY  1987  . CATARACT EXTRACTION  2011   right eye  . HEMORRHOID SURGERY  04/15/14   thrombosed int. Hemorrhoid. Dr. Violeta Gelinas  . HERNIA REPAIR  05/03/2011   left  . INGUINAL HERNIA REPAIR  12/28/2011   Procedure: HERNIA REPAIR INGUINAL ADULT;  Surgeon: Emelia Loron, MD;  Location: [facility];  Service: General;  Laterality: Right;  . NASAL POLYP SURGERY  01/05/2011   DR. NEWMAN   . NASAL SINUS SURGERY  12/2010  . removed anal warts  1976   Dr Terri Piedra   . RETINAL DETACHMENT REPAIR W/ SCLERAL BUCKLE LE  07/22/2008   right eye; pars plana vitrectomy  . TONSILLECTOMY AND ADENOIDECTOMY  1964  . TYMPANOSTOMY TUBE PLACEMENT  June 2015   Dr. Salena Saner. [facility]   Social History:   reports that he quit smoking about 19 years ago. His smoking use included Cigarettes. He has never used smokeless tobacco. He reports that he drinks alcohol. He reports that he does not use drugs.  Family History  Problem Relation Age of Onset  . Stroke Father   . Cancer Brother     prostate  . Cancer Paternal Grandfather     colon    Medications: Patient's Medications  New Prescriptions   No medications on file  Previous Medications   AMPHETAMINE-DEXTROAMPHETAMINE (ADDERALL XR) 10 MG 24 HR CAPSULE    Take one tablet once a day for nerves   BUTALBITAL-ACETAMINOPHEN-CAFFEINE (FIORICET) 50-325-40 MG TABLET    Take one tablet four times a day as needed for headaches   CLONAZEPAM (KLONOPIN) 0.5 MG TABLET    take 1 tablet by mouth three times a day if needed   DICYCLOMINE (BENTYL) 20 MG TABLET    Take One tablet by mouth every 6 hours as needed to control pain from irritable  bowel syndrome   FLUTICASONE (FLONASE) 50 MCG/ACT NASAL SPRAY    Place 2 sprays into the nose as needed.    HALOBETASOL (ULTRAVATE) 0.05 % CREAM    Apply topically 2 (two) times daily. Apply to rash up to three times a day   LOVASTATIN (MEVACOR) 20 MG TABLET    take 1 tablet by mouth at bedtime TO CONTROL CHOLESTEROL   MULTIPLE VITAMIN (MULTIVITAMIN) TABLET    Take 1 tablet by mouth daily.    Modified Medications   No medications on file  Discontinued Medications   No medications on file     Physical Exam:  Vitals:   01/02/17 1144  BP: 130/84  Pulse: 80  Resp: 17  Temp: 98.2 F (36.8 C)  TempSrc: Oral  SpO2: 98%  Weight: 184 lb 6.4 oz (83.6  kg)  Height: 5\' 10"  (1.778 m)   Body mass index is 26.46 kg/m.  Physical Exam  Constitutional: He is oriented to person, place, and time. He appears well-developed and well-nourished.  HENT:  Head: Normocephalic.  Right Ear: External ear and ear canal normal. A middle ear effusion is present.  Left Ear: Tympanic membrane, external ear and ear canal normal.  Nose: Mucosal edema present.  Mouth/Throat: Oropharyngeal exudate present.  Neck: Normal range of motion. Neck supple.  Cardiovascular: Normal rate, regular rhythm and normal heart sounds.   Pulmonary/Chest: Effort normal and breath sounds normal.  Musculoskeletal: Normal range of motion.  Lymphadenopathy:    He has no cervical adenopathy.  Neurological: He is alert and oriented to person, place, and time.  Skin: Skin is warm and dry.  Psychiatric: He has a normal mood and affect. His behavior is normal. Judgment and thought content normal.    Labs reviewed: Basic Metabolic Panel:  Recent Labs  16/09/9604/12/17 0812 11/10/16 0813  NA 138 138  K 4.2 4.7  CL 101 102  CO2 23 23  GLUCOSE 94 99  BUN 9 14  CREATININE 1.03 1.02  CALCIUM 8.8 9.4   Liver Function Tests:  Recent Labs  05/05/16 0812 11/10/16 0813  AST 22 20  ALT 25 39  ALKPHOS 51 72  BILITOT 0.3 0.3  PROT  6.2 6.9  ALBUMIN 4.3 4.6   No results for input(s): LIPASE, AMYLASE in the last 8760 hours. No results for input(s): AMMONIA in the last 8760 hours. CBC: No results for input(s): WBC, NEUTROABS, HGB, HCT, MCV, PLT in the last 8760 hours. Lipid Panel:  Recent Labs  05/05/16 0812 11/10/16 0813  CHOL 136 195  HDL 64 72  LDLCALC 64 102*  TRIG 41 106  CHOLHDL 2.1 2.7   TSH: No results for input(s): TSH in the last 8760 hours. A1C: No results found for: HGBA1C   Assessment/Plan 1. Acute recurrent sinusitis, unspecified location - Continue nasal wash - Afrin once daily x3 days only - Saline nasal spray as needed throughout the day for nasal congestion - Take probiotic while on antibiotics - Good hydration and nutrition - Schedule an appointment with Dr. Ezzard StandingNewman (ENT) - levofloxacin (LEVAQUIN) 500 MG tablet; Take 1 tablet (500 mg total) by mouth daily.  Dispense: 10 tablet; Refill: 0   Follow-up as needed  Dylann Layne K. Biagio BorgEubanks, AGNP  [facility] [phone](Monday-Friday 8 am - 5 pm) [phone] (after hours)

## 2017-01-02 NOTE — Patient Instructions (Addendum)
Schedule an appointment with Dr. Ezzard StandingNewman. Levaquin 500 mg by mouth daily for 10 days Saline nasal spray as needed Cont nasal wash      Sinusitis, Adult Sinusitis is soreness and inflammation of your sinuses. Sinuses are hollow spaces in the bones around your face. They are located:  Around your eyes.  In the middle of your forehead.  Behind your nose.  In your cheekbones. Your sinuses and nasal passages are lined with a stringy fluid (mucus). Mucus normally drains out of your sinuses. When your nasal tissues get inflamed or swollen, the mucus can get trapped or blocked so air cannot flow through your sinuses. This lets bacteria, viruses, and funguses grow, and that leads to infection. Follow these instructions at home: Medicines  Take, use, or apply over-the-counter and prescription medicines only as told by your doctor. These may include nasal sprays.  If you were prescribed an antibiotic medicine, take it as told by your doctor. Do not stop taking the antibiotic even if you start to feel better. Hydrate and Humidify  Drink enough water to keep your pee (urine) clear or pale yellow.  Use a cool mist humidifier to keep the humidity level in your home above 50%.  Breathe in steam for 10-15 minutes, 3-4 times a day or as told by your doctor. You can do this in the bathroom while a hot shower is running.  Try not to spend time in cool or dry air. Rest  Rest as much as possible.  Sleep with your head raised (elevated).  Make sure to get enough sleep each night. General instructions  Put a warm, moist washcloth on your face 3-4 times a day or as told by your doctor. This will help with discomfort.  Wash your hands often with soap and water. If there is no soap and water, use hand sanitizer.  Do not smoke. Avoid being around people who are smoking (secondhand smoke).  Keep all follow-up visits as told by your doctor. This is important. Contact a doctor if:  You have a  fever.  Your symptoms get worse.  Your symptoms do not get better within 10 days. Get help right away if:  You have a very bad headache.  You cannot stop throwing up (vomiting).  You have pain or swelling around your face or eyes.  You have trouble seeing.  You feel confused.  Your neck is stiff.  You have trouble breathing. This information is not intended to replace advice given to you by your health care provider. Make sure you discuss any questions you have with your health care provider. Document Released: 05/29/2008 Document Revised: 08/06/2016 Document Reviewed: 10/06/2015 Elsevier Interactive Patient Education  2017 [employer]

## 2017-01-22 ENCOUNTER — Other Ambulatory Visit: Payer: Self-pay | Admitting: Otolaryngology

## 2017-01-22 DIAGNOSIS — J330 Polyp of nasal cavity: Secondary | ICD-10-CM

## 2017-01-25 ENCOUNTER — Other Ambulatory Visit: Payer: BLUE CROSS/BLUE SHIELD

## 2017-01-26 ENCOUNTER — Ambulatory Visit
Admission: RE | Admit: 2017-01-26 | Discharge: 2017-01-26 | Disposition: A | Discharge status: Home / Self Care | Payer: BLUE CROSS/BLUE SHIELD | Source: Ambulatory Visit | Attending: Otolaryngology | Admitting: Otolaryngology

## 2017-01-26 DIAGNOSIS — J330 Polyp of nasal cavity: Secondary | ICD-10-CM

## 2017-01-29 ENCOUNTER — Other Ambulatory Visit: Payer: Self-pay | Admitting: *Deleted

## 2017-01-29 MED ORDER — AMPHETAMINE-DEXTROAMPHET ER 10 MG PO CP24: ORAL_CAPSULE | ORAL | 0 refills | Status: DC

## 2017-01-29 NOTE — Telephone Encounter (Signed)
Patient requested and will pick up 

## 2017-01-30 ENCOUNTER — Encounter: Payer: Self-pay | Admitting: Internal Medicine

## 2017-02-05 ENCOUNTER — Telehealth: Payer: Self-pay

## 2017-02-05 DIAGNOSIS — R739 Hyperglycemia, unspecified: Secondary | ICD-10-CM

## 2017-02-05 NOTE — Telephone Encounter (Signed)
Would it be possible to add an A1c onto upcoming lab drawn for [employer] purposes.   Please advise

## 2017-02-06 NOTE — Telephone Encounter (Signed)
I approve adding the AIc for the diagnosis of hyperglycemia.

## 2017-02-14 ENCOUNTER — Telehealth: Payer: Self-pay | Admitting: *Deleted

## 2017-02-14 MED ORDER — CIPROFLOXACIN HCL 500 MG PO TABS: ORAL_TABLET | ORAL | 0 refills | Status: DC

## 2017-02-14 MED ORDER — METRONIDAZOLE 500 MG PO TABS: ORAL_TABLET | ORAL | 0 refills | Status: DC

## 2017-02-14 NOTE — Telephone Encounter (Signed)
I spoke with Dr. Agustin CreeGreed--Cipro 500mg  (21) one three times daily for infection and Metronidazole 500mg  (21) one three times daily for infection.   Faxed to pharmacy and patient notified and agreed.

## 2017-02-14 NOTE — Telephone Encounter (Signed)
Patient called and stated that he is having a Diverticulitis Flare and wants to know if an antibiotic can be called to pharmacy. No available appointments. Please Advise.

## 2017-02-14 NOTE — Telephone Encounter (Signed)
Error in Dr. Name below---suppose to state Dr. Chilton SiGreen.

## 2017-02-23 ENCOUNTER — Telehealth: Payer: Self-pay

## 2017-02-23 NOTE — Telephone Encounter (Signed)
I called [phone] to initiate PA for Adderall XR 10 mg cap 1 by mouth daily   ID: [id number]  Awaiting fax from insurance company

## 2017-02-26 NOTE — Telephone Encounter (Signed)
Forms were received from [facility] for additional information regarding PA for Adderall XR. Forms were placed in [facility] folder for review and signing. Please fax completed form to [phone]

## 2017-02-28 ENCOUNTER — Other Ambulatory Visit: Payer: Self-pay | Admitting: *Deleted

## 2017-02-28 MED ORDER — AMPHETAMINE-DEXTROAMPHET ER 10 MG PO CP24: ORAL_CAPSULE | ORAL | 0 refills | Status: DC

## 2017-02-28 NOTE — Telephone Encounter (Signed)
Patient requested and will pick up 

## 2017-03-30 ENCOUNTER — Other Ambulatory Visit: Payer: Self-pay

## 2017-03-30 MED ORDER — AMPHETAMINE-DEXTROAMPHET ER 10 MG PO CP24: ORAL_CAPSULE | ORAL | 0 refills | Status: DC

## 2017-03-30 NOTE — Telephone Encounter (Signed)
Patient called and requested.

## 2017-04-03 ENCOUNTER — Other Ambulatory Visit: Payer: Self-pay | Admitting: Internal Medicine

## 2017-04-03 DIAGNOSIS — F902 Attention-deficit hyperactivity disorder, combined type: Secondary | ICD-10-CM

## 2017-04-04 HISTORY — PX: CATARACT EXTRACTION W/ INTRAOCULAR LENS IMPLANT: SHX1309

## 2017-04-30 ENCOUNTER — Other Ambulatory Visit: Payer: Self-pay | Admitting: *Deleted

## 2017-04-30 MED ORDER — AMPHETAMINE-DEXTROAMPHET ER 10 MG PO CP24: ORAL_CAPSULE | ORAL | 0 refills | Status: DC

## 2017-04-30 NOTE — Telephone Encounter (Signed)
Patient requested and will pick up 

## 2017-04-30 NOTE — Telephone Encounter (Signed)
Patient informed rx signed and ready for pickup  

## 2017-05-14 ENCOUNTER — Other Ambulatory Visit: Payer: BLUE CROSS/BLUE SHIELD

## 2017-05-16 ENCOUNTER — Ambulatory Visit: Payer: BLUE CROSS/BLUE SHIELD | Admitting: Internal Medicine

## 2017-05-28 ENCOUNTER — Other Ambulatory Visit: Payer: BLUE CROSS/BLUE SHIELD

## 2017-05-28 DIAGNOSIS — E785 Hyperlipidemia, unspecified: Secondary | ICD-10-CM

## 2017-05-28 DIAGNOSIS — I10 Essential (primary) hypertension: Secondary | ICD-10-CM

## 2017-05-28 DIAGNOSIS — R739 Hyperglycemia, unspecified: Secondary | ICD-10-CM

## 2017-05-28 LAB — COMPREHENSIVE METABOLIC PANEL
ALK PHOS: 64 U/L (ref 40–115)
ALT: 27 U/L (ref 9–46)
AST: 23 U/L (ref 10–35)
Albumin: 4 g/dL (ref 3.6–5.1)
BILIRUBIN TOTAL: 0.4 mg/dL (ref 0.2–1.2)
BUN: 14 mg/dL (ref 7–25)
CO2: 22 mmol/L (ref 20–31)
CREATININE: 1.09 mg/dL (ref 0.70–1.33)
Calcium: 8.8 mg/dL (ref 8.6–10.3)
Chloride: 103 mmol/L (ref 98–110)
GLUCOSE: 105 mg/dL — AB (ref 65–99)
Potassium: 4.2 mmol/L (ref 3.5–5.3)
SODIUM: 138 mmol/L (ref 135–146)
Total Protein: 6.5 g/dL (ref 6.1–8.1)

## 2017-05-28 LAB — LIPID PANEL
CHOL/HDL RATIO: 2.5 ratio (ref <5.0)
CHOLESTEROL: 161 mg/dL (ref <200)
HDL: 65 mg/dL (ref >40)
LDL CALC: 82 mg/dL (ref <100)
Triglycerides: 70 mg/dL (ref <150)
VLDL: 14 mg/dL (ref <30)

## 2017-05-29 LAB — HEMOGLOBIN A1C
Hgb A1c MFr Bld: 5.5 % (ref <5.7)
Mean Plasma Glucose: 111 mg/dL

## 2017-05-30 ENCOUNTER — Ambulatory Visit (INDEPENDENT_AMBULATORY_CARE_PROVIDER_SITE_OTHER): Payer: BLUE CROSS/BLUE SHIELD | Admitting: Internal Medicine

## 2017-05-30 ENCOUNTER — Encounter: Payer: Self-pay | Admitting: Internal Medicine

## 2017-05-30 VITALS — BP 174/82 | HR 79 | Temp 98.3°F | Ht 70.0 in | Wt 187.0 lb

## 2017-05-30 DIAGNOSIS — G44221 Chronic tension-type headache, intractable: Secondary | ICD-10-CM

## 2017-05-30 DIAGNOSIS — K580 Irritable bowel syndrome with diarrhea: Secondary | ICD-10-CM

## 2017-05-30 DIAGNOSIS — K5730 Diverticulosis of large intestine without perforation or abscess without bleeding: Secondary | ICD-10-CM | POA: Diagnosis not present

## 2017-05-30 DIAGNOSIS — M7520 Bicipital tendinitis, unspecified shoulder: Secondary | ICD-10-CM | POA: Insufficient documentation

## 2017-05-30 DIAGNOSIS — M7521 Bicipital tendinitis, right shoulder: Secondary | ICD-10-CM

## 2017-05-30 DIAGNOSIS — I10 Essential (primary) hypertension: Secondary | ICD-10-CM

## 2017-05-30 DIAGNOSIS — R739 Hyperglycemia, unspecified: Secondary | ICD-10-CM | POA: Diagnosis not present

## 2017-05-30 DIAGNOSIS — E785 Hyperlipidemia, unspecified: Secondary | ICD-10-CM

## 2017-05-30 MED ORDER — AMPHETAMINE-DEXTROAMPHET ER 10 MG PO CP24: ORAL_CAPSULE | ORAL | 0 refills | Status: DC

## 2017-05-30 MED ORDER — LOVASTATIN 20 MG PO TABS: ORAL_TABLET | ORAL | 6 refills | Status: DC

## 2017-05-30 MED ORDER — BUTALBITAL-APAP-CAFFEINE 50-325-40 MG PO TABS: ORAL_TABLET | ORAL | 5 refills | Status: DC

## 2017-05-30 NOTE — Progress Notes (Signed)
Facility  [facility]    Place of Service:   OFFICE    Allergies  Allergen Reactions  . Amitriptyline   . Zolpidem Tartrate Other (See Comments)    Severe headache  . Amoxicillin-Pot Clavulanate Rash  . Propoxyphene Hcl Nausea Only    Chief Complaint  Patient presents with  . Medical Management of Chronic Issues    6 month medication management blood pressure, cholesterol, hyperglycemia, review labs.     HPI:     Had flu like symptoms last week. Better 3 days ago.   Benign essential HTN - home pressure is normal  Hyperlipidemia, unspecified hyperlipidemia type - controlled on Lovastatin  Hyperglycemia - controlled  Chronic tension-type headache, intractable - continues to benefit from butalbital-acetaminophen-caffeine (FIORICET) 50-325-40 MG tablet  Biceps tendinitis of right upper extremity - started with pain in the right shoulder anteriorly and medial to the head of the humerus in March 2018. Stretching arm backwards aggravates the pain.  No specific injury. Using Aleve, Aspercreme, Krill oil, Schiff's ultra stuff.  Irritable bowel syndrome with diarrhea - stable.   Diverticulosis of colon without hemorrhage - Had attack of diverticulitis that is fully resolved    Medications: Patient's Medications  New Prescriptions   No medications on file  Previous Medications   BUTALBITAL-ACETAMINOPHEN-CAFFEINE (FIORICET) 50-325-40 MG TABLET    Take one tablet four times a day as needed for headaches   CLONAZEPAM (KLONOPIN) 0.5 MG TABLET    take 1 tablet by mouth three times a day if needed   DICYCLOMINE (BENTYL) 20 MG TABLET    Take One tablet by mouth every 6 hours as needed to control pain from irritable bowel syndrome   FLUTICASONE (FLONASE) 50 MCG/ACT NASAL SPRAY    Place 2 sprays into the nose as needed.    HALOBETASOL (ULTRAVATE) 0.05 % CREAM    Apply topically 2 (two) times daily. Apply to rash up to three times a day   MULTIPLE VITAMIN (MULTIVITAMIN) TABLET    Take 1  tablet by mouth daily.    Modified Medications   Modified Medication Previous Medication   AMPHETAMINE-DEXTROAMPHETAMINE (ADDERALL XR) 10 MG 24 HR CAPSULE amphetamine-dextroamphetamine (ADDERALL XR) 10 MG 24 hr capsule      Take one tablet once a day for nerves    Take one tablet once a day for nerves   LOVASTATIN (MEVACOR) 20 MG TABLET lovastatin (MEVACOR) 20 MG tablet      take 1 tablet by mouth at bedtime TO CONTROL CHOLESTEROL    take 1 tablet by mouth at bedtime TO CONTROL CHOLESTEROL  Discontinued Medications   CIPROFLOXACIN (CIPRO) 500 MG TABLET    Take one tablet by mouth three times daily for infection   LEVOFLOXACIN (LEVAQUIN) 500 MG TABLET    Take 1 tablet (500 mg total) by mouth daily.   METRONIDAZOLE (FLAGYL) 500 MG TABLET    Take one tablet by mouth three times daily for infection    Review of Systems  Constitutional: Positive for fatigue.  HENT: Negative for ear pain.        Recurrent sinus congestion. Has seen Dr. Radene Journey several timeS for SINUS infection. Had right tympanostomy tube in July 2015.  Eyes: Positive for visual disturbance (corrective lenses).  Cardiovascular: Negative for chest pain, palpitations and leg swelling.  Gastrointestinal: Negative for abdominal distention, abdominal pain, constipation, diarrhea and nausea.       Intermittent abd spasm and discomfort that is linked to his IBS. Diverticulosis and recurrent  episodes of diverticulitis.  Endocrine: Negative.   Musculoskeletal: Positive for arthralgias.       Pain in the right shoulder.  Skin:       Mild eczema of the dorsum of the hands  Allergic/Immunologic: Negative.   Neurological: Positive for headaches.  Hematological: Negative.   Psychiatric/Behavioral: Positive for decreased concentration (Hx ADD. Currentl under contro with Adderall.). The patient is nervous/anxious.     Vitals:   05/30/17 1051  BP: (!) 174/82  Pulse: 79  Temp: 98.3 F (36.8 C)  TempSrc: Oral  SpO2: 98%  Weight:  187 lb (84.8 kg)  Height: '5\' 10"'  (1.778 m)   Body mass index is 26.83 kg/m. Wt Readings from Last 3 Encounters:  05/30/17 187 lb (84.8 kg)  01/02/17 184 lb 6.4 oz (83.6 kg)  11/14/16 178 lb (80.7 kg)      Physical Exam  Constitutional: He is oriented to person, place, and time. He appears well-developed and well-nourished. No distress.  HENT:  Head: Normocephalic and atraumatic.  Right Ear: External ear normal.  Left Ear: External ear normal.  Nose: Nose normal.  Mouth/Throat: Oropharynx is clear and moist.  Blue tympanostomy tube right EAC  Eyes: Conjunctivae and EOM are normal. Pupils are equal, round, and reactive to light.  Neck: No JVD present. No tracheal deviation present. No thyromegaly present.  Cardiovascular: Normal rate, regular rhythm and intact distal pulses.  Exam reveals no gallop and no friction rub.   No murmur heard. Pulmonary/Chest: No respiratory distress. He has no wheezes. He has no rales.  Abdominal: He exhibits no distension and no mass. There is no tenderness.  Genitourinary:  Genitourinary Comments: Marland Kitchen  Musculoskeletal: Normal range of motion. He exhibits no edema or tenderness.  Very tender at the right short head of the biceps tendon.  Lymphadenopathy:    He has no cervical adenopathy.  Neurological: He is alert and oriented to person, place, and time. No cranial nerve deficit. Coordination normal.  Skin: No erythema. No pallor.  Mild eczema of the dorsum of the hands  Psychiatric: He has a normal mood and affect. His behavior is normal. Judgment and thought content normal.    Labs reviewed: Lab Summary Latest Ref Rng & Units 05/28/2017 11/10/2016 05/05/2016  Hemoglobin 13.0-17.0  (None) (None) (None)  Hematocrit 39.0-52.0  (None) (None) (None)  White count - (None) (None) (None)  Platelet count - (None) (None) (None)  Sodium 135 - 146 mmol/L 138 138 138  Potassium 3.5 - 5.3 mmol/L 4.2 4.7 4.2  Calcium 8.6 - 10.3 mg/dL 8.8 9.4 8.8  Phosphorus  - (None) (None) (None)  Creatinine 0.70 - 1.33 mg/dL 1.09 1.02 1.03  AST 10 - 35 U/L '23 20 22  ' Alk Phos 40 - 115 U/L 64 72 51  Bilirubin 0.2 - 1.2 mg/dL 0.4 0.3 0.3  Glucose 65 - 99 mg/dL 105(H) 99 94  Cholesterol <200 mg/dL 161 195 (None)  HDL cholesterol >40 mg/dL 65 72 64  Triglycerides <150 mg/dL 70 106 41  LDL Direct - (None) (None) (None)  LDL Calc <100 mg/dL 82 102(H) 64  Total protein 6.1 - 8.1 g/dL 6.5 6.9 (None)  Albumin 3.6 - 5.1 g/dL 4.0 4.6 4.3  Some recent data might be hidden   No results found for: TSH, T3TOTAL, T4TOTAL, THYROIDAB Lab Results  Component Value Date   BUN 14 05/28/2017   BUN 14 11/10/2016   BUN 9 05/05/2016   Lab Results  Component Value Date   HGBA1C 5.5  05/28/2017    Assessment/Plan  1. Benign essential HTN continue to monitor  2. Hyperlipidemia, unspecified hyperlipidemia type Continue lovastatin  3. Hyperglycemia Diet controlled. Needs to lose weight.  4. Chronic tension-type headache, intractable - butalbital-acetaminophen-caffeine (FIORICET) 50-325-40 MG tablet; Take one tablet four times a day as needed for headaches  Dispense: 120 tablet; Refill: 5  5. Biceps tendinitis of right upper extremity Continue Aleve, Aspercream, etc. Ortho referral if not better in several weeks.  6. Irritable bowel syndrome with diarrhea Continue current meds.  7. Diverticulosis of colon without hemorrhage observe

## 2017-05-31 ENCOUNTER — Other Ambulatory Visit: Payer: Self-pay | Admitting: *Deleted

## 2017-05-31 MED ORDER — LOVASTATIN 20 MG PO TABS: ORAL_TABLET | ORAL | 3 refills | Status: DC

## 2017-05-31 NOTE — Telephone Encounter (Signed)
Patient requested 90 day supply. Faxed to pharmacy.  

## 2017-06-29 ENCOUNTER — Other Ambulatory Visit: Payer: Self-pay

## 2017-06-29 MED ORDER — AMPHETAMINE-DEXTROAMPHET ER 10 MG PO CP24: ORAL_CAPSULE | ORAL | 0 refills | Status: DC

## 2017-07-30 ENCOUNTER — Other Ambulatory Visit: Payer: Self-pay | Admitting: *Deleted

## 2017-07-30 MED ORDER — AMPHETAMINE-DEXTROAMPHET ER 10 MG PO CP24: ORAL_CAPSULE | ORAL | 0 refills | Status: DC

## 2017-07-30 NOTE — Telephone Encounter (Signed)
Patient requested and will pick up 

## 2017-08-06 ENCOUNTER — Other Ambulatory Visit: Payer: Self-pay | Admitting: Nurse Practitioner

## 2017-08-06 ENCOUNTER — Other Ambulatory Visit: Payer: Self-pay | Admitting: Internal Medicine

## 2017-08-06 DIAGNOSIS — L309 Dermatitis, unspecified: Secondary | ICD-10-CM

## 2017-08-06 DIAGNOSIS — F902 Attention-deficit hyperactivity disorder, combined type: Secondary | ICD-10-CM

## 2017-08-13 ENCOUNTER — Ambulatory Visit (INDEPENDENT_AMBULATORY_CARE_PROVIDER_SITE_OTHER): Payer: BLUE CROSS/BLUE SHIELD | Admitting: Nurse Practitioner

## 2017-08-13 ENCOUNTER — Encounter: Payer: Self-pay | Admitting: Nurse Practitioner

## 2017-08-13 VITALS — BP 140/86 | HR 83 | Temp 98.1°F | Resp 17 | Ht 70.0 in | Wt 189.8 lb

## 2017-08-13 DIAGNOSIS — L309 Dermatitis, unspecified: Secondary | ICD-10-CM | POA: Diagnosis not present

## 2017-08-13 DIAGNOSIS — J0191 Acute recurrent sinusitis, unspecified: Secondary | ICD-10-CM | POA: Diagnosis not present

## 2017-08-13 MED ORDER — CRISABOROLE 2 % EX OINT: 1.0000 "application " | TOPICAL_OINTMENT | Freq: Two times a day (BID) | CUTANEOUS | 2 refills | Status: DC

## 2017-08-13 MED ORDER — CEFUROXIME AXETIL 250 MG PO TABS: 250.0000 mg | ORAL_TABLET | Freq: Two times a day (BID) | ORAL | 0 refills | Status: DC

## 2017-08-13 NOTE — Progress Notes (Signed)
Careteam: Patient Care Team: Sharon Seller, NP as PCP - General (Geriatric Medicine) Drema Halon, MD as Consulting Physician (Otolaryngology) Violeta Gelinas, MD as Consulting Physician (General Surgery) Jethro Bolus, MD as Consulting Physician (Ophthalmology) Meryl Dare, MD as Consulting Physician (Gastroenterology) Emelia Loron, MD as Consulting Physician (General Surgery) Nita Sells, MD (Dermatology)  Advanced Directive information Does Patient Have a Medical Advance Directive?: No  Allergies  Allergen Reactions  . Amitriptyline   . Zolpidem Tartrate Other (See Comments)    Severe headache  . Amoxicillin-Pot Clavulanate Rash  . Propoxyphene Hcl Nausea Only    Chief Complaint  Patient presents with  . Acute Visit    Pt is being seen due to sinus pressure/drainage, and fever off/on for 3 weeks      HPI: Patient is a 61 y.o. male seen in the office today due to sinus infection.  Last infection in January- was treated with recurrent antibiotics and ended up requiring Levaquin twice and Ceftin for 15 days which it finally resolve.  Sinuses started to stop up 3 weeks ago then 3 days started having fever with increase discomfort with greenish discharge.  Doing nasal washes TID.  Also doing Flonase and sudafed. These has helped but not seeing improvement.  Lots of pain and pressure.   Taking tylenol and aleve now.   Review of Systems:  Review of Systems  Constitutional: Positive for fever and malaise/fatigue. Negative for chills.  HENT: Positive for congestion, sinus pain and sore throat. Negative for ear pain and hearing loss.   Respiratory: Negative for cough, sputum production, shortness of breath and wheezing.   Cardiovascular: Negative for chest pain.  Neurological: Positive for headaches. Negative for dizziness.    Past Medical History:  Diagnosis Date  . Abdominal pain, left lower quadrant   . Actinic keratosis   . ADD (attention  deficit disorder)   . Alcohol abuse, unspecified   . Anxiety   . Anxiety state, unspecified   . Arthritis    shoulders and hips  . Atrial fibrillation (HCC)   . Attention deficit disorder without mention of hyperactivity   . Cervicalgia   . Depressive disorder, not elsewhere classified   . Eczema 11/2011   right hand  . Edema   . Encounter for long-term (current) use of other medications   . Headache(784.0)    tension or sinus  . High cholesterol   . Inguinal hernia 11/2011   right  . Inguinal hernia without mention of obstruction or gangrene, unilateral or unspecified, (not specified as recurrent)   . Insomnia, unspecified   . Internal hemorrhoids without mention of complication   . Irritable bowel syndrome   . Irritable bowel syndrome (IBS)   . Lumbago   . Major depressive disorder, recurrent episode, severe, without mention of psychotic behavior   . Obesity, unspecified   . Other and unspecified hyperlipidemia   . Other atopic dermatitis and related conditions   . Other disorders of vitreous   . Other seborrheic keratosis   . Other specified visual disturbances   . Pain in joint, pelvic region and thigh   . Pain in joint, site unspecified   . Pathologic fracture of vertebrae   . Poisoning and toxic reactions caused by other specified animals and plants   . Recent retinal detachment, partial, with single defect   . Routine general medical examination at a health care facility   . Sciatica   . Sinus infection 11/2011   started  antibiotic 12/18/2011 x 7 days; current cough  . Special screening for malignant neoplasm of prostate   . Tension headache   . Type II or unspecified type diabetes mellitus without mention of complication, not stated as uncontrolled   . Unspecified essential hypertension    Past Surgical History:  Procedure Laterality Date  . APPENDECTOMY  1987  . CATARACT EXTRACTION  2011   right eye  . CATARACT EXTRACTION W/ INTRAOCULAR LENS IMPLANT Left  04/04/2017   Dr. Dawna Part  . HEMORRHOID SURGERY  04/15/14   thrombosed int. Hemorrhoid. Dr. Violeta Gelinas  . HERNIA REPAIR  05/03/2011   left  . INGUINAL HERNIA REPAIR  12/28/2011   Procedure: HERNIA REPAIR INGUINAL ADULT;  Surgeon: Emelia Loron, MD;  Location: [facility];  Service: General;  Laterality: Right;  . NASAL POLYP SURGERY  01/05/2011   DR. NEWMAN   . NASAL SINUS SURGERY  12/2010  . removed anal warts  1976   Dr Terri Piedra   . RETINAL DETACHMENT REPAIR W/ SCLERAL BUCKLE LE  07/22/2008   right eye; pars plana vitrectomy  . TONSILLECTOMY AND ADENOIDECTOMY  1964  . TYMPANOSTOMY TUBE PLACEMENT  June 2015   Dr. Salena Saner. [facility]   Social History:   reports that he quit smoking about 20 years ago. His smoking use included Cigarettes. He has never used smokeless tobacco. He reports that he drinks alcohol. He reports that he does not use drugs.  Family History  Problem Relation Age of Onset  . Stroke Father   . Cancer Brother        prostate  . Cancer Paternal Grandfather        colon    Medications: Patient's Medications  New Prescriptions   No medications on file  Previous Medications   AMPHETAMINE-DEXTROAMPHETAMINE (ADDERALL XR) 10 MG 24 HR CAPSULE    Take one tablet once a day for nerves   BUTALBITAL-ACETAMINOPHEN-CAFFEINE (FIORICET) 50-325-40 MG TABLET    Take one tablet four times a day as needed for headaches   CLONAZEPAM (KLONOPIN) 0.5 MG TABLET    take 1 tablet by mouth three times a day if needed   DICYCLOMINE (BENTYL) 20 MG TABLET    take 1 tablet by mouth every 6 hours if needed TO CONTROL PAIN FROM IRRITABLE BOWEL SYNDROME   FLUTICASONE (FLONASE) 50 MCG/ACT NASAL SPRAY    Place 2 sprays into the nose as needed.    HALOBETASOL (ULTRAVATE) 0.05 % CREAM    apply to rash UP TO 3 TIMES A DAY   LOVASTATIN (MEVACOR) 20 MG TABLET    Take one tablet by mouth at bedtime to control cholesterol   MULTIPLE VITAMIN (MULTIVITAMIN) TABLET    Take 1 tablet by mouth daily.     Modified Medications   No medications on file  Discontinued Medications   No medications on file     Physical Exam:  Vitals:   08/13/17 1555  BP: 140/86  Pulse: 83  Resp: 17  Temp: 98.1 F (36.7 C)  TempSrc: Oral  SpO2: 99%  Weight: 189 lb 12.8 oz (86.1 kg)  Height: 5\' 10"  (1.778 m)   Body mass index is 27.23 kg/m.  Physical Exam  Constitutional: He is oriented to person, place, and time. He appears well-developed and well-nourished.  HENT:  Head: Normocephalic.  Right Ear: External ear and ear canal normal. A middle ear effusion is present.  Left Ear: Tympanic membrane, external ear and ear canal normal.  Nose: Mucosal edema present.  Mouth/Throat: No oropharyngeal exudate.  Neck: Normal range of motion. Neck supple.  Cardiovascular: Normal rate, regular rhythm and normal heart sounds.   Pulmonary/Chest: Effort normal and breath sounds normal.  Musculoskeletal: Normal range of motion.  Lymphadenopathy:    He has no cervical adenopathy.  Neurological: He is alert and oriented to person, place, and time.  Skin: Skin is warm and dry.  Psychiatric: He has a normal mood and affect. His behavior is normal. Judgment and thought content normal.    Labs reviewed: Basic Metabolic Panel:  Recent Labs  62/69/48 0813 05/28/17 0820  NA 138 138  K 4.7 4.2  CL 102 103  CO2 23 22  GLUCOSE 99 105*  BUN 14 14  CREATININE 1.02 1.09  CALCIUM 9.4 8.8   Liver Function Tests:  Recent Labs  11/10/16 0813 05/28/17 0820  AST 20 23  ALT 39 27  ALKPHOS 72 64  BILITOT 0.3 0.4  PROT 6.9 6.5  ALBUMIN 4.6 4.0   No results for input(s): LIPASE, AMYLASE in the last 8760 hours. No results for input(s): AMMONIA in the last 8760 hours. CBC: No results for input(s): WBC, NEUTROABS, HGB, HCT, MCV, PLT in the last 8760 hours. Lipid Panel:  Recent Labs  11/10/16 0813 05/28/17 0820  CHOL 195 161  HDL 72 65  LDLCALC 102* 82  TRIG 106 70  CHOLHDL 2.7 2.5   TSH: No  results for input(s): TSH in the last 8760 hours. A1C: Lab Results  Component Value Date   HGBA1C 5.5 05/28/2017     Assessment/Plan 1. Acute recurrent sinusitis, unspecified location -cont supportive care, encouraged to use nettipot twice daily - cefUROXime (CEFTIN) 250 MG tablet; Take 1 tablet (250 mg total) by mouth 2 (two) times daily with a meal.  Dispense: 20 tablet; Refill: 0  2. Eczema, unspecified type - Crisaborole (EUCRISA) 2 % OINT; Apply 1 application topically 2 (two) times daily.  Sample given to notify if he would like Rx called in.   Next appt: as scheduled and PRN  Jeannie Mallinger K. Biagio Borg  [facility] [phone] 8 am - 5 pm) [phone] (after hours)

## 2017-08-13 NOTE — Patient Instructions (Signed)
To use eucrisa to effected area twice daily   To use Ceftin twice daily twice daily for 15 days    Sinusitis, Adult Sinusitis is soreness and inflammation of your sinuses. Sinuses are hollow spaces in the bones around your face. Your sinuses are located:  Around your eyes.  In the middle of your forehead.  Behind your nose.  In your cheekbones.  Your sinuses and nasal passages are lined with a stringy fluid (mucus). Mucus normally drains out of your sinuses. When your nasal tissues become inflamed or swollen, the mucus can become trapped or blocked so air cannot flow through your sinuses. This allows bacteria, viruses, and funguses to grow, which leads to infection. Sinusitis can develop quickly and last for 7?10 days (acute) or for more than 12 weeks (chronic). Sinusitis often develops after a cold. What are the causes? This condition is caused by anything that creates swelling in the sinuses or stops mucus from draining, including:  Allergies.  Asthma.  Bacterial or viral infection.  Abnormally shaped bones between the nasal passages.  Nasal growths that contain mucus (nasal polyps).  Narrow sinus openings.  Pollutants, such as chemicals or irritants in the air.  A foreign object stuck in the nose.  A fungal infection. This is rare.  What increases the risk? The following factors may make you more likely to develop this condition:  Having allergies or asthma.  Having had a recent cold or respiratory tract infection.  Having structural deformities or blockages in your nose or sinuses.  Having a weak immune system.  Doing a lot of swimming or diving.  Overusing nasal sprays.  Smoking.  What are the signs or symptoms? The main symptoms of this condition are pain and a feeling of pressure around the affected sinuses. Other symptoms include:  Upper toothache.  Earache.  Headache.  Bad breath.  Decreased sense of smell and taste.  A cough that may get  worse at night.  Fatigue.  Fever.  Thick drainage from your nose. The drainage is often green and it may contain pus (purulent).  Stuffy nose or congestion.  Postnasal drip. This is when extra mucus collects in the throat or back of the nose.  Swelling and warmth over the affected sinuses.  Sore throat.  Sensitivity to light.  How is this diagnosed? This condition is diagnosed based on symptoms, a medical history, and a physical exam. To find out if your condition is acute or chronic, your health care provider may:  Look in your nose for signs of nasal polyps.  Tap over the affected sinus to check for signs of infection.  View the inside of your sinuses using an imaging device that has a light attached (endoscope).  If your health care provider suspects that you have chronic sinusitis, you may also:  Be tested for allergies.  Have a sample of mucus taken from your nose (nasal culture) and checked for bacteria.  Have a mucus sample examined to see if your sinusitis is related to an allergy.  If your sinusitis does not respond to treatment and it lasts longer than 8 weeks, you may have an MRI or CT scan to check your sinuses. These scans also help to determine how severe your infection is. In rare cases, a bone biopsy may be done to rule out more serious types of fungal sinus disease. How is this treated? Treatment for sinusitis depends on the cause and whether your condition is chronic or acute. If a virus is  causing your sinusitis, your symptoms will go away on their own within 10 days. You may be given medicines to relieve your symptoms, including:  Topical nasal decongestants. They shrink swollen nasal passages and let mucus drain from your sinuses.  Antihistamines. These drugs block inflammation that is triggered by allergies. This can help to ease swelling in your nose and sinuses.  Topical nasal corticosteroids. These are nasal sprays that ease inflammation and  swelling in your nose and sinuses.  Nasal saline washes. These rinses can help to get rid of thick mucus in your nose.  If your condition is caused by bacteria, you will be given an antibiotic medicine. If your condition is caused by a fungus, you will be given an antifungal medicine. Surgery may be needed to correct underlying conditions, such as narrow nasal passages. Surgery may also be needed to remove polyps. Follow these instructions at home: Medicines  Take, use, or apply over-the-counter and prescription medicines only as told by your health care provider. These may include nasal sprays.  If you were prescribed an antibiotic medicine, take it as told by your health care provider. Do not stop taking the antibiotic even if you start to feel better. Hydrate and Humidify  Drink enough water to keep your urine clear or pale yellow. Staying hydrated will help to thin your mucus.  Use a cool mist humidifier to keep the humidity level in your home above 50%.  Inhale steam for 10-15 minutes, 3-4 times a day or as told by your health care provider. You can do this in the bathroom while a hot shower is running.  Limit your exposure to cool or dry air. Rest  Rest as much as possible.  Sleep with your head raised (elevated).  Make sure to get enough sleep each night. General instructions  Apply a warm, moist washcloth to your face 3-4 times a day or as told by your health care provider. This will help with discomfort.  Wash your hands often with soap and water to reduce your exposure to viruses and other germs. If soap and water are not available, use hand sanitizer.  Do not smoke. Avoid being around people who are smoking (secondhand smoke).  Keep all follow-up visits as told by your health care provider. This is important. Contact a health care provider if:  You have a fever.  Your symptoms get worse.  Your symptoms do not improve within 10 days. Get help right away  if:  You have a severe headache.  You have persistent vomiting.  You have pain or swelling around your face or eyes.  You have vision problems.  You develop confusion.  Your neck is stiff.  You have trouble breathing. This information is not intended to replace advice given to you by your health care provider. Make sure you discuss any questions you have with your health care provider. Document Released: 12/11/2005 Document Revised: 08/06/2016 Document Reviewed: 10/06/2015 Elsevier Interactive Patient Education  2017 [employer].

## 2017-08-29 ENCOUNTER — Other Ambulatory Visit: Payer: Self-pay | Admitting: *Deleted

## 2017-08-29 DIAGNOSIS — L309 Dermatitis, unspecified: Secondary | ICD-10-CM

## 2017-08-29 MED ORDER — AMPHETAMINE-DEXTROAMPHET ER 10 MG PO CP24: ORAL_CAPSULE | ORAL | 0 refills | Status: DC

## 2017-08-29 MED ORDER — CRISABOROLE 2 % EX OINT: 1.0000 "application " | TOPICAL_OINTMENT | Freq: Two times a day (BID) | CUTANEOUS | 2 refills | Status: DC

## 2017-08-29 NOTE — Telephone Encounter (Signed)
Patient requested and will pick up [place]CC[place] Database Checked.  

## 2017-09-03 ENCOUNTER — Telehealth: Payer: Self-pay | Admitting: *Deleted

## 2017-09-03 DIAGNOSIS — J0191 Acute recurrent sinusitis, unspecified: Secondary | ICD-10-CM

## 2017-09-03 NOTE — Telephone Encounter (Signed)
Patient called and stated that he saw you on 08/13/17 for Sinus Infection and you gave him an antibiotic and he completed the coarse. It cleared up and he was feeling good and this weekend the same symptoms returned, fever,congestion and sinus pain. Patient is wondering if he needs another round of antibiotic. Please Advise.

## 2017-09-04 MED ORDER — CEFUROXIME AXETIL 500 MG PO TABS: 500.0000 mg | ORAL_TABLET | Freq: Two times a day (BID) | ORAL | 0 refills | Status: DC

## 2017-09-04 NOTE — Telephone Encounter (Signed)
Patient notified

## 2017-09-04 NOTE — Telephone Encounter (Signed)
Sent another round to pharmacy on file

## 2017-09-25 ENCOUNTER — Encounter: Payer: Self-pay | Admitting: Nurse Practitioner

## 2017-09-28 ENCOUNTER — Other Ambulatory Visit: Payer: Self-pay | Admitting: *Deleted

## 2017-09-28 MED ORDER — AMPHETAMINE-DEXTROAMPHET ER 10 MG PO CP24: ORAL_CAPSULE | ORAL | 0 refills | Status: DC

## 2017-09-28 NOTE — Telephone Encounter (Signed)
Patient requested and will pick up 

## 2017-10-05 ENCOUNTER — Encounter: Payer: Self-pay | Admitting: Nurse Practitioner

## 2017-10-12 NOTE — Telephone Encounter (Signed)
Spoke with patient for he had not responded to message. Patient does not have any fever. Patient will follow-up with Dr.Newman (no pending appt at this time). Patient will call back if needed

## 2017-10-29 ENCOUNTER — Other Ambulatory Visit: Payer: Self-pay

## 2017-10-29 MED ORDER — AMPHETAMINE-DEXTROAMPHET ER 10 MG PO CP24: ORAL_CAPSULE | ORAL | 0 refills | Status: DC

## 2017-10-29 NOTE — Telephone Encounter (Signed)
[place] Narx checked 

## 2017-11-13 ENCOUNTER — Other Ambulatory Visit: Payer: Self-pay

## 2017-11-13 DIAGNOSIS — E785 Hyperlipidemia, unspecified: Secondary | ICD-10-CM

## 2017-11-13 DIAGNOSIS — R739 Hyperglycemia, unspecified: Secondary | ICD-10-CM

## 2017-11-27 ENCOUNTER — Other Ambulatory Visit: Payer: BLUE CROSS/BLUE SHIELD

## 2017-11-27 DIAGNOSIS — R739 Hyperglycemia, unspecified: Secondary | ICD-10-CM

## 2017-11-27 DIAGNOSIS — E785 Hyperlipidemia, unspecified: Secondary | ICD-10-CM

## 2017-11-27 LAB — LIPID PANEL
CHOLESTEROL: 185 mg/dL (ref <200)
HDL: 63 mg/dL (ref >40)
LDL Cholesterol (Calc): 105 mg/dL (calc) — ABNORMAL HIGH
Non-HDL Cholesterol (Calc): 122 mg/dL (calc) (ref <130)
TRIGLYCERIDES: 81 mg/dL (ref <150)
Total CHOL/HDL Ratio: 2.9 (calc) (ref <5.0)

## 2017-11-27 LAB — BASIC METABOLIC PANEL WITH GFR
BUN: 17 mg/dL (ref 7–25)
CO2: 28 mmol/L (ref 20–32)
CREATININE: 1.07 mg/dL (ref 0.70–1.25)
Calcium: 9.2 mg/dL (ref 8.6–10.3)
Chloride: 104 mmol/L (ref 98–110)
GFR, Est African American: 87 mL/min/{1.73_m2} (ref >=60)
GFR, Est Non African American: 75 mL/min/{1.73_m2} (ref >=60)
GLUCOSE: 120 mg/dL — AB (ref 65–99)
POTASSIUM: 4.7 mmol/L (ref 3.5–5.3)
SODIUM: 138 mmol/L (ref 135–146)

## 2017-11-28 ENCOUNTER — Other Ambulatory Visit: Payer: Self-pay | Admitting: *Deleted

## 2017-11-28 MED ORDER — AMPHETAMINE-DEXTROAMPHET ER 10 MG PO CP24: ORAL_CAPSULE | ORAL | 0 refills | Status: DC

## 2017-11-28 NOTE — Telephone Encounter (Signed)
Patient requested and will pick up NCCSRS Database Verified.  

## 2017-11-29 ENCOUNTER — Ambulatory Visit: Payer: BLUE CROSS/BLUE SHIELD | Admitting: Nurse Practitioner

## 2017-11-29 ENCOUNTER — Encounter: Payer: Self-pay | Admitting: Nurse Practitioner

## 2017-11-29 VITALS — BP 130/82 | HR 89 | Temp 98.5°F | Resp 17 | Ht 70.0 in | Wt 185.6 lb

## 2017-11-29 DIAGNOSIS — J324 Chronic pansinusitis: Secondary | ICD-10-CM

## 2017-11-29 DIAGNOSIS — E785 Hyperlipidemia, unspecified: Secondary | ICD-10-CM

## 2017-11-29 DIAGNOSIS — R739 Hyperglycemia, unspecified: Secondary | ICD-10-CM

## 2017-11-29 DIAGNOSIS — G44221 Chronic tension-type headache, intractable: Secondary | ICD-10-CM

## 2017-11-29 DIAGNOSIS — F902 Attention-deficit hyperactivity disorder, combined type: Secondary | ICD-10-CM

## 2017-11-29 DIAGNOSIS — Z1211 Encounter for screening for malignant neoplasm of colon: Secondary | ICD-10-CM

## 2017-11-29 DIAGNOSIS — M7521 Bicipital tendinitis, right shoulder: Secondary | ICD-10-CM

## 2017-11-29 DIAGNOSIS — F419 Anxiety disorder, unspecified: Secondary | ICD-10-CM

## 2017-11-29 DIAGNOSIS — K580 Irritable bowel syndrome with diarrhea: Secondary | ICD-10-CM

## 2017-11-29 LAB — CBC WITH DIFFERENTIAL/PLATELET
Basophils Absolute: 43 cells/uL (ref 0–200)
Basophils Relative: 0.5 %
EOS PCT: 0.5 %
Eosinophils Absolute: 43 cells/uL (ref 15–500)
HCT: 41.2 % (ref 38.5–50.0)
Hemoglobin: 13.9 g/dL (ref 13.2–17.1)
Lymphs Abs: 2064 cells/uL (ref 850–3900)
MCH: 31.5 pg (ref 27.0–33.0)
MCHC: 33.7 g/dL (ref 32.0–36.0)
MCV: 93.4 fL (ref 80.0–100.0)
MONOS PCT: 11.8 %
MPV: 10.3 fL (ref 7.5–12.5)
NEUTROS PCT: 63.2 %
Neutro Abs: 5435 cells/uL (ref 1500–7800)
PLATELETS: 203 10*3/uL (ref 140–400)
RBC: 4.41 10*6/uL (ref 4.20–5.80)
RDW: 12.1 % (ref 11.0–15.0)
TOTAL LYMPHOCYTE: 24 %
WBC mixed population: 1015 cells/uL — ABNORMAL HIGH (ref 200–950)
WBC: 8.6 10*3/uL (ref 3.8–10.8)

## 2017-11-29 MED ORDER — CLONAZEPAM 0.5 MG PO TABS: ORAL_TABLET | ORAL | 0 refills | Status: DC

## 2017-11-29 NOTE — Progress Notes (Signed)
Careteam: Patient Care Team: [name], Jenesis Martin K, NP as PCP - General (Geriatric Medicine) [name], Christopher E, MD as Consulting Physician (Otolaryngology) [name], Burke, MD as Consulting Physician (General Surgery) [name], Mark, MD as Consulting Physician (Ophthalmology) Meryl DareStark, Malcolm T, MD as Consulting Physician (Gastroenterology) Emelia LoronWakefield, Matthew, MD as Consulting Physician (General Surgery) Nita SellsHall, John, MD (Dermatology)  Advanced Directive information Does Patient Have a Medical Advance Directive?: Yes, Type of Advance Directive: Healthcare Power of [place];Living will  Allergies  Allergen Reactions  . Amitriptyline   . Zolpidem Tartrate Other (See Comments)    Severe headache  . Amoxicillin-Pot Clavulanate Rash  . Propoxyphene Hcl Nausea Only    Chief Complaint  Patient presents with  . Medical Management of Chronic Issues    Pt is being seen for a 6 month routine visit. Pt is still having pain in right shoulder due to tendonitis.   . Forms    Pt needs Biometric screening form completed for work.   . Medication Refill    Needs refill on clonazepam; verified on [place]MP [place] database  . Depression screening    score of 0     HPI: Patient is a 61 y.o. male seen in the office today for routine follow up.  Former pt of Dr Chilton SiGreen. Pt with hx of chronic sinusitis, htn, hyperglycemia, headache, IBS-D, diverticulitis.   Sinusitis- following with Dr Ezzard StandingNewman- gave rx for flonase and prednisone and levaquin which helped him.   Migraine- has Fioricet PRN, has not had a headache in over a month.   ADD- taking adderal which helps him focur.   Hx of biceps tendonitis of right had been using aleve, aspercream but no better. Has been ongoing for at least 6 months.   IBS-D using bentyl for cramping.   Overdue to for colonoscopy.   Taking clonazepam as needed for anxiety and will take 1/2 for sleep due to increase thoughts which makes him anxious.  Using this every other  night if he wakes up, does not feel groggy or sleepy in the morning.   Hyperglycemia- fasting glucose was 120, says he attempts to limit sweets and carbohydrate.    Review of Systems:  Review of Systems  Constitutional: Negative for chills, fever and malaise/fatigue.  HENT: Positive for congestion. Negative for ear pain, hearing loss, sinus pain and sore throat.   Respiratory: Negative for cough, sputum production, shortness of breath and wheezing.   Cardiovascular: Negative for chest pain.  Gastrointestinal: Positive for abdominal pain (occasional cramping). Negative for diarrhea and vomiting.  Musculoskeletal: Positive for myalgias (right shoulder pain. ).  Neurological: Positive for headaches (mostly sinus). Negative for dizziness.    Past Medical History:  Diagnosis Date  . Abdominal pain, left lower quadrant   . Actinic keratosis   . ADD (attention deficit disorder)   . Alcohol abuse, unspecified   . Anxiety   . Anxiety state, unspecified   . Arthritis    shoulders and hips  . Atrial fibrillation (HCC)   . Attention deficit disorder without mention of hyperactivity   . Cervicalgia   . Depressive disorder, not elsewhere classified   . Eczema 11/2011   right hand  . Edema   . Encounter for long-term (current) use of other medications   . Headache(784.0)    tension or sinus  . High cholesterol   . Inguinal hernia 11/2011   right  . Inguinal hernia without mention of obstruction or gangrene, unilateral or unspecified, (not specified as recurrent)   .  Insomnia, unspecified   . Internal hemorrhoids without mention of complication   . Irritable bowel syndrome   . Irritable bowel syndrome (IBS)   . Lumbago   . Major depressive disorder, recurrent episode, severe, without mention of psychotic behavior   . Obesity, unspecified   . Other and unspecified hyperlipidemia   . Other atopic dermatitis and related conditions   . Other disorders of vitreous   . Other seborrheic  keratosis   . Other specified visual disturbances   . Pain in joint, pelvic region and thigh   . Pain in joint, site unspecified   . Pathologic fracture of vertebrae   . Poisoning and toxic reactions caused by other specified animals and plants   . Recent retinal detachment, partial, with single defect   . Routine general medical examination at a health care facility   . Sciatica   . Sinus infection 11/2011   started antibiotic 12/18/2011 x 7 days; current cough  . Special screening for malignant neoplasm of prostate   . Tension headache   . Type II or unspecified type diabetes mellitus without mention of complication, not stated as uncontrolled   . Unspecified essential hypertension    Past Surgical History:  Procedure Laterality Date  . APPENDECTOMY  1987  . CATARACT EXTRACTION  2011   right eye  . CATARACT EXTRACTION W/ INTRAOCULAR LENS IMPLANT Left 04/04/2017   Dr. Dawna Part  . HEMORRHOID SURGERY  04/15/14   thrombosed int. Hemorrhoid. Dr. Violeta Gelinas  . HERNIA REPAIR  05/03/2011   left  . INGUINAL HERNIA REPAIR  12/28/2011   Procedure: HERNIA REPAIR INGUINAL ADULT;  Surgeon: Emelia Loron, MD;  Location: [facility];  Service: General;  Laterality: Right;  . NASAL POLYP SURGERY  01/05/2011   DR. NEWMAN   . NASAL SINUS SURGERY  12/2010  . removed anal warts  1976   Dr Terri Piedra   . RETINAL DETACHMENT REPAIR W/ SCLERAL BUCKLE LE  07/22/2008   right eye; pars plana vitrectomy  . TONSILLECTOMY AND ADENOIDECTOMY  1964  . TYMPANOSTOMY TUBE PLACEMENT  June 2015   Dr. Salena Saner. Newman   Social History:   reports that he quit smoking about 20 years ago. His smoking use included cigarettes. he has never used smokeless tobacco. He reports that he drinks alcohol. He reports that he does not use drugs.  Family History  Problem Relation Age of Onset  . Stroke Father   . Cancer Brother        prostate  . Cancer Paternal Grandfather        colon    Medications:     Medication List        Accurate as of 11/29/17  8:47 AM. Always use your most recent med list.          amphetamine-dextroamphetamine 10 MG 24 hr capsule Commonly known as:  ADDERALL XR Take one tablet once a day for nerves   butalbital-acetaminophen-caffeine 50-325-40 MG tablet Commonly known as:  FIORICET Take one tablet four times a day as needed for headaches   clonazePAM 0.5 MG tablet Commonly known as:  KLONOPIN take 1 tablet by mouth three times a day if needed   Crisaborole 2 % Oint Commonly known as:  EUCRISA Apply 1 application topically 2 (two) times daily.   dicyclomine 20 MG tablet Commonly known as:  BENTYL take 1 tablet by mouth every 6 hours if needed TO CONTROL PAIN FROM IRRITABLE BOWEL SYNDROME   fluticasone 50 MCG/ACT  nasal spray Commonly known as:  FLONASE   halobetasol 0.05 % cream Commonly known as:  ULTRAVATE apply to rash UP TO 3 TIMES A DAY   lovastatin 20 MG tablet Commonly known as:  MEVACOR Take one tablet by mouth at bedtime to control cholesterol   multivitamin tablet        Physical Exam:  Vitals:   11/29/17 0836  BP: 130/82  Pulse: 89  Resp: 17  Temp: 98.5 F (36.9 C)  TempSrc: Oral  SpO2: 99%  Weight: 185 lb 9.6 oz (84.2 kg)  Height: 5\' 10"  (1.778 m)   Body mass index is 26.63 kg/m.  Physical Exam  Constitutional: He is oriented to person, place, and time. He appears well-developed and well-nourished. No distress.  HENT:  Head: Normocephalic and atraumatic.  Nose: Nose normal.  Eyes: Conjunctivae and EOM are normal. Pupils are equal, round, and reactive to light.  Cardiovascular: Normal rate, regular rhythm and normal heart sounds. Exam reveals no gallop and no friction rub.  No murmur heard. Pulmonary/Chest: Effort normal and breath sounds normal.  Abdominal: Soft. Bowel sounds are normal.  Genitourinary:  Genitourinary Comments: Marland Kitchen  Musculoskeletal: Normal range of motion. He exhibits no edema or tenderness.   tender at the right short head of the biceps tendon.  Neurological: He is alert and oriented to person, place, and time. No cranial nerve deficit. Coordination normal.  Skin: Skin is warm and dry.  Psychiatric: He has a normal mood and affect. His behavior is normal. Judgment and thought content normal.    Labs reviewed: Basic Metabolic Panel: Recent Labs    05/28/17 0820 11/27/17 0820  NA 138 138  K 4.2 4.7  CL 103 104  CO2 22 28  GLUCOSE 105* 120*  BUN 14 17  CREATININE 1.09 1.07  CALCIUM 8.8 9.2   Liver Function Tests: Recent Labs    05/28/17 0820  AST 23  ALT 27  ALKPHOS 64  BILITOT 0.4  PROT 6.5  ALBUMIN 4.0   No results for input(s): LIPASE, AMYLASE in the last 8760 hours. No results for input(s): AMMONIA in the last 8760 hours. CBC: No results for input(s): WBC, NEUTROABS, HGB, HCT, MCV, PLT in the last 8760 hours. Lipid Panel: Recent Labs    05/28/17 0820 11/27/17 0820  CHOL 161 185  HDL 65 63  LDLCALC 82  --   TRIG 70 81  CHOLHDL 2.5 2.9   TSH: No results for input(s): TSH in the last 8760 hours. A1C: Lab Results  Component Value Date   HGBA1C 5.5 05/28/2017     Assessment/Plan 1. Attention deficit hyperactivity disorder (ADHD), combined type -controlled on adderal XR  2. Hyperlipidemia, unspecified hyperlipidemia type LDL slightly elevated on lovastatin 20 mg daily with diet modification.  - CBC with Differential/Platelets  3. Hyperglycemia -encouraged diet modifications. Fasting blood sugar at 120 which is elevated more than previous. - Hemoglobin A1c  4. Chronic tension-type headache, intractable -stable at this time.   5. Biceps tendinitis of right upper extremity -ongoing pain.  - Ambulatory referral to Orthopedics  6. Irritable bowel syndrome with diarrhea Stable, uses dicyclomine as needed  7. Chronic pansinusitis Stable without symptoms at this time. Followed with ENT who has him a new nasal spray.   8. Screening for  malignant neoplasm of colon - Ambulatory referral to Gastroenterology  9. Anxiety -controlled on clonazepam, using occasionally.  - clonazePAM (KLONOPIN) 0.5 MG tablet; take 1 tablet by mouth three times a day if needed  Dispense: 90 tablet; Refill: 0  Next appt: 6 months lab work prior to appt.  Janene HarveyJessica K. Biagio BorgEubanks, AGNP  [facility] [phone](Monday-Friday 8 am - 5 pm) [phone] (after hours)

## 2017-11-29 NOTE — Patient Instructions (Signed)
DASH Eating Plan DASH stands for "Dietary Approaches to Stop Hypertension." The DASH eating plan is a healthy eating plan that has been shown to reduce high blood pressure (hypertension). It may also reduce your risk for type 2 diabetes, heart disease, and stroke. The DASH eating plan may also help with weight loss. What are tips for following this plan? General guidelines  Avoid eating more than 2,300 mg (milligrams) of salt (sodium) a day. If you have hypertension, you may need to reduce your sodium intake to 1,500 mg a day.  Limit alcohol intake to no more than 1 drink a day for nonpregnant women and 2 drinks a day for men. One drink equals 12 oz of beer, 5 oz of wine, or 1 oz of hard liquor.  Work with your health care provider to maintain a healthy body weight or to lose weight. Ask what an ideal weight is for you.  Get at least 30 minutes of exercise that causes your heart to beat faster (aerobic exercise) most days of the week. Activities may include walking, swimming, or biking.  Work with your health care provider or diet and nutrition specialist (dietitian) to adjust your eating plan to your individual calorie needs. Reading food labels  Check food labels for the amount of sodium per serving. Choose foods with less than 5 percent of the Daily Value of sodium. Generally, foods with less than 300 mg of sodium per serving fit into this eating plan.  To find whole grains, look for the word "whole" as the first word in the ingredient list. Shopping  Buy products labeled as "low-sodium" or "no salt added."  Buy fresh foods. Avoid canned foods and premade or frozen meals. Cooking  Avoid adding salt when cooking. Use salt-free seasonings or herbs instead of table salt or sea salt. Check with your health care provider or pharmacist before using salt substitutes.  Do not fry foods. Cook foods using healthy methods such as baking, boiling, grilling, and broiling instead.  Cook with  heart-healthy oils, such as olive, canola, soybean, or sunflower oil. Meal planning   Eat a balanced diet that includes: ? 5 or more servings of fruits and vegetables each day. At each meal, try to fill half of your plate with fruits and vegetables. ? Up to 6-8 servings of whole grains each day. ? Less than 6 oz of lean meat, poultry, or fish each day. A 3-oz serving of meat is about the same size as a deck of cards. One egg equals 1 oz. ? 2 servings of low-fat dairy each day. ? A serving of nuts, seeds, or beans 5 times each week. ? Heart-healthy fats. Healthy fats called Omega-3 fatty acids are found in foods such as flaxseeds and coldwater fish, like sardines, salmon, and mackerel.  Limit how much you eat of the following: ? Canned or prepackaged foods. ? Food that is high in trans fat, such as fried foods. ? Food that is high in saturated fat, such as fatty meat. ? Sweets, desserts, sugary drinks, and other foods with added sugar. ? Full-fat dairy products.  Do not salt foods before eating.  Try to eat at least 2 vegetarian meals each week.  Eat more home-cooked food and less restaurant, buffet, and fast food.  When eating at a restaurant, ask that your food be prepared with less salt or no salt, if possible. What foods are recommended? The items listed may not be a complete list. Talk with your dietitian about what   dietary choices are best for you. Grains Whole-grain or whole-wheat bread. Whole-grain or whole-wheat pasta. Brown rice. Oatmeal. Quinoa. Bulgur. Whole-grain and low-sodium cereals. Pita bread. Low-fat, low-sodium crackers. Whole-wheat flour tortillas. Vegetables Fresh or frozen vegetables (raw, steamed, roasted, or grilled). Low-sodium or reduced-sodium tomato and vegetable juice. Low-sodium or reduced-sodium tomato sauce and tomato paste. Low-sodium or reduced-sodium canned vegetables. Fruits All fresh, dried, or frozen fruit. Canned fruit in natural juice (without  added sugar). Meat and other protein foods Skinless chicken or turkey. Ground chicken or turkey. Pork with fat trimmed off. Fish and seafood. Egg whites. Dried beans, peas, or lentils. Unsalted nuts, nut butters, and seeds. Unsalted canned beans. Lean cuts of beef with fat trimmed off. Low-sodium, lean deli meat. Dairy Low-fat (1%) or fat-free (skim) milk. Fat-free, low-fat, or reduced-fat cheeses. Nonfat, low-sodium ricotta or cottage cheese. Low-fat or nonfat yogurt. Low-fat, low-sodium cheese. Fats and oils Soft margarine without trans fats. Vegetable oil. Low-fat, reduced-fat, or light mayonnaise and salad dressings (reduced-sodium). Canola, safflower, olive, soybean, and sunflower oils. Avocado. Seasoning and other foods Herbs. Spices. Seasoning mixes without salt. Unsalted popcorn and pretzels. Fat-free sweets. What foods are not recommended? The items listed may not be a complete list. Talk with your dietitian about what dietary choices are best for you. Grains Baked goods made with fat, such as croissants, muffins, or some breads. Dry pasta or rice meal packs. Vegetables Creamed or fried vegetables. Vegetables in a cheese sauce. Regular canned vegetables (not low-sodium or reduced-sodium). Regular canned tomato sauce and paste (not low-sodium or reduced-sodium). Regular tomato and vegetable juice (not low-sodium or reduced-sodium). Pickles. Olives. Fruits Canned fruit in a light or heavy syrup. Fried fruit. Fruit in cream or butter sauce. Meat and other protein foods Fatty cuts of meat. Ribs. Fried meat. Bacon. Sausage. Bologna and other processed lunch meats. Salami. Fatback. Hotdogs. Bratwurst. Salted nuts and seeds. Canned beans with added salt. Canned or smoked fish. Whole eggs or egg yolks. Chicken or turkey with skin. Dairy Whole or 2% milk, cream, and half-and-half. Whole or full-fat cream cheese. Whole-fat or sweetened yogurt. Full-fat cheese. Nondairy creamers. Whipped toppings.  Processed cheese and cheese spreads. Fats and oils Butter. Stick margarine. Lard. Shortening. Ghee. Bacon fat. Tropical oils, such as coconut, palm kernel, or palm oil. Seasoning and other foods Salted popcorn and pretzels. Onion salt, garlic salt, seasoned salt, table salt, and sea salt. Worcestershire sauce. Tartar sauce. Barbecue sauce. Teriyaki sauce. Soy sauce, including reduced-sodium. Steak sauce. Canned and packaged gravies. Fish sauce. Oyster sauce. Cocktail sauce. Horseradish that you find on the shelf. Ketchup. Mustard. Meat flavorings and tenderizers. Bouillon cubes. Hot sauce and Tabasco sauce. Premade or packaged marinades. Premade or packaged taco seasonings. Relishes. Regular salad dressings. Where to find more information:  National Heart, Lung, and Blood Institute: www.nhlbi.nih.gov  American Heart Association: www.heart.org Summary  The DASH eating plan is a healthy eating plan that has been shown to reduce high blood pressure (hypertension). It may also reduce your risk for type 2 diabetes, heart disease, and stroke.  With the DASH eating plan, you should limit salt (sodium) intake to 2,300 mg a day. If you have hypertension, you may need to reduce your sodium intake to 1,500 mg a day.  When on the DASH eating plan, aim to eat more fresh fruits and vegetables, whole grains, lean proteins, low-fat dairy, and heart-healthy fats.  Work with your health care provider or diet and nutrition specialist (dietitian) to adjust your eating plan to your individual   calorie needs. This information is not intended to replace advice given to you by your health care provider. Make sure you discuss any questions you have with your health care provider. Document Released: 11/30/2011 Document Revised: 12/04/2016 Document Reviewed: 12/04/2016 Elsevier Interactive Patient Education  2017 [employer]vier Inc.  

## 2017-11-30 LAB — HEMOGLOBIN A1C
EAG (MMOL/L): 6 (calc)
HEMOGLOBIN A1C: 5.4 %{Hb} (ref <5.7)
MEAN PLASMA GLUCOSE: 108 (calc)

## 2017-12-06 ENCOUNTER — Telehealth: Payer: Self-pay | Admitting: *Deleted

## 2017-12-06 NOTE — Telephone Encounter (Signed)
We gave this form back to him at time of visit and sent to scanning unaware we were supposed to fax this

## 2017-12-06 NOTE — Telephone Encounter (Signed)
Patient called and stated that he saw Jessica on 11/29/17 and she was to fill out a [employer] Screening Report and fax it to [facility]. He called them to see if they have received it and as of today they still have not received the report. Needs it to be faxed. Please Advise.

## 2017-12-06 NOTE — Telephone Encounter (Signed)
I spoke with patient to ask if he could bring the original copy by the office to re-fax because the office copy has been sent to scanning. Pt stated that he was going to email copy of forms to [facility].

## 2017-12-10 ENCOUNTER — Ambulatory Visit (INDEPENDENT_AMBULATORY_CARE_PROVIDER_SITE_OTHER): Payer: BLUE CROSS/BLUE SHIELD | Admitting: Orthopedic Surgery

## 2017-12-10 ENCOUNTER — Encounter (INDEPENDENT_AMBULATORY_CARE_PROVIDER_SITE_OTHER): Payer: Self-pay | Admitting: Orthopedic Surgery

## 2017-12-10 ENCOUNTER — Ambulatory Visit (INDEPENDENT_AMBULATORY_CARE_PROVIDER_SITE_OTHER): Payer: Self-pay

## 2017-12-10 DIAGNOSIS — G8929 Other chronic pain: Secondary | ICD-10-CM | POA: Diagnosis not present

## 2017-12-10 DIAGNOSIS — M25511 Pain in right shoulder: Secondary | ICD-10-CM | POA: Diagnosis not present

## 2017-12-10 MED ORDER — MELOXICAM 15 MG PO TABS: ORAL_TABLET | ORAL | 0 refills | Status: DC

## 2017-12-12 ENCOUNTER — Encounter (INDEPENDENT_AMBULATORY_CARE_PROVIDER_SITE_OTHER): Payer: Self-pay | Admitting: Orthopedic Surgery

## 2017-12-12 NOTE — Progress Notes (Signed)
Office Visit Note   Patient: Connor MunsonRichard L [name]           Date of Birth: [birth year]           MRN: [id number] Visit Date: 12/10/2017 Requested by: [name], Jessica K, NP [address] [place], [place] [id number] PCP: [name], Jessica K, NP  Subjective: Chief Complaint  Patient presents with  . Right Shoulder - Pain    HPI: Connor [name] is a 61 year old patient with right shoulder pain.  The pain started in March after he mowed 5 acres.  The pain will wake him from sleep at night.  He reports he is unable to reach behind him.  It hurts him to lay on his stomach.    He denies any weakness.  Ibuprofen irritates his stomach    at times.               ROS: All systems reviewed are negative as they relate to the chief complaint within the history of present illness.  Patient denies  fevers or chills.   Assessment & Plan: Visit Diagnoses:  1. Chronic right shoulder pain     Plan: Impression is right shoulder pain which may be a labral problem or bursitis.  His rotator cuff strength is excellent.  No evidence of frozen shoulder.     But no evidence of radiculopathy.      Plan is Mobic for 3 weeks. We will see him back in 6 weeks for consideration of shot or MRI scanning  Follow-Up Instructions: Return in about 6 weeks (around 01/21/2018).   Orders:  Orders Placed This Encounter  Procedures  . XR Shoulder Right   Meds ordered this encounter  Medications  . meloxicam (MOBIC) 15 MG tablet    Sig: 1 po q d x 6 weeks    Dispense:  50 tablet    Refill:  0      Procedures: No procedures performed   Clinical Data: No additional findings.  Objective: Vital Signs: There were no vitals taken for this visit.  Physical Exam:   Constitutional: Patient appears well-developed HEENT:  Head: Normocephalic Eyes:EOM are normal Neck: Normal range of motion Cardiovascular: Normal rate Pulmonary/chest: Effort normal Neurologic: Patient is alert Skin: Skin is warm Psychiatric: Patient  has normal mood and affect    Ortho Exam: Orthopedic exam demonstrates good cervical spine range of motion.  Patient reports excellent rotator cuff strength isolated infraspinatus supraspinatus and subscapularis muscle testing.  No restriction of external rotation at 15 degrees of abduction on the right or left hand side.  No acromioclavicular joint tenderness on the right.  No coarse grinding or crepitus with passive range of motion on the right.  No other masses lymphadenopathy or skin changes noted in the right shoulder girdle region.  Negative apprehension relocation testing.  Positive O'Brien's testing on the right.  Equivocal impingement signs on the right.  Specialty Comments:  No specialty comments available.  Imaging: No results found.   PMFS History: Patient Active Problem List   Diagnosis Date Noted  . Biceps tendonitis 05/30/2017  . Marital conflict 11/14/2016  . Screening for prostate cancer 05/09/2016  . Eczema 11/09/2015  . Palpitation 05/12/2015  . Hyperglycemia 05/12/2015  . Diverticulosis of colon without hemorrhage 12/14/2014  . Adjustment disorder with anxiety 07/13/2014  . Adjustment insomnia 07/13/2014  . Benign essential HTN 07/13/2014  . Allergic rhinitis 07/13/2014  . Chronic tension-type headache, intractable 07/13/2014  . Thrombosed external hemorrhoid 04/15/2014  . Pain in  joint, ankle and foot 10/29/2013  . Pain in joint, shoulder region 10/29/2013  . Tympanic membrane disorder 10/29/2013  . Hyperlipidemia 05/03/2013  . ADD (attention deficit disorder)   . Irritable bowel syndrome (IBS)   . Lumbago    Past Medical History:  Diagnosis Date  . Abdominal pain, left lower quadrant   . Actinic keratosis   . ADD (attention deficit disorder)   . Alcohol abuse, unspecified   . Anxiety   . Anxiety state, unspecified   . Arthritis    shoulders and hips  . Atrial fibrillation (HCC)   . Attention deficit disorder without mention of hyperactivity   .  Cervicalgia   . Depressive disorder, not elsewhere classified   . Eczema 11/2011   right hand  . Edema   . Encounter for long-term (current) use of other medications   . Headache(784.0)    tension or sinus  . High cholesterol   . Inguinal hernia 11/2011   right  . Inguinal hernia without mention of obstruction or gangrene, unilateral or unspecified, (not specified as recurrent)   . Insomnia, unspecified   . Internal hemorrhoids without mention of complication   . Irritable bowel syndrome   . Irritable bowel syndrome (IBS)   . Lumbago   . Major depressive disorder, recurrent episode, severe, without mention of psychotic behavior   . Obesity, unspecified   . Other and unspecified hyperlipidemia   . Other atopic dermatitis and related conditions   . Other disorders of vitreous   . Other seborrheic keratosis   . Other specified visual disturbances   . Pain in joint, pelvic region and thigh   . Pain in joint, site unspecified   . Pathologic fracture of vertebrae   . Poisoning and toxic reactions caused by other specified animals and plants   . Recent retinal detachment, partial, with single defect   . Routine general medical examination at a health care facility   . Sciatica   . Sinus infection 11/2011   started antibiotic 12/18/2011 x 7 days; current cough  . Special screening for malignant neoplasm of prostate   . Tension headache   . Type II or unspecified type diabetes mellitus without mention of complication, not stated as uncontrolled   . Unspecified essential hypertension     Family History  Problem Relation Age of Onset  . Stroke Father   . Cancer Brother        prostate  . Cancer Paternal Grandfather        colon    Past Surgical History:  Procedure Laterality Date  . APPENDECTOMY  1987  . CATARACT EXTRACTION  2011   right eye  . CATARACT EXTRACTION W/ INTRAOCULAR LENS IMPLANT Left 04/04/2017   Dr. Dawna PartShaprio  . HEMORRHOID SURGERY  04/15/14   thrombosed int.  Hemorrhoid. Dr. Violeta GelinasBurke Thompson  . HERNIA REPAIR  05/03/2011   left  . INGUINAL HERNIA REPAIR  12/28/2011   Procedure: HERNIA REPAIR INGUINAL ADULT;  Surgeon: Emelia LoronMatthew Wakefield, MD;  Location: [facility];  Service: General;  Laterality: Right;  . NASAL POLYP SURGERY  01/05/2011   DR. NEWMAN   . NASAL SINUS SURGERY  12/2010  . removed anal warts  1976   Dr Terri PiedraLupton   . RETINAL DETACHMENT REPAIR W/ SCLERAL BUCKLE LE  07/22/2008   right eye; pars plana vitrectomy  . TONSILLECTOMY AND ADENOIDECTOMY  1964  . TYMPANOSTOMY TUBE PLACEMENT  June 2015   Dr. Salena Saner. Ezzard StandingNewman   Social History  Occupational History  . Not on file  Tobacco Use  . Smoking status: Former Smoker    Types: Cigarettes    Last attempt to quit: 05/29/1997    Years since quitting: 20.5  . Smokeless tobacco: Never Used  Substance and Sexual Activity  . Alcohol use: Yes    Alcohol/week: 0.0 oz    Comment: 1-3 beers/day  . Drug use: No  . Sexual activity: Yes    Partners: Female    Comment: Wife

## 2017-12-21 ENCOUNTER — Telehealth: Payer: Self-pay | Admitting: *Deleted

## 2017-12-21 DIAGNOSIS — Z1211 Encounter for screening for malignant neoplasm of colon: Secondary | ICD-10-CM

## 2017-12-21 NOTE — Telephone Encounter (Signed)
Patient called and stated that he just received a call from Dr. Kenna GilbertMann's office regarding a Colonoscopy and he is concerned because they could not even give him details concerning the prep for the exam. Patient doesn't want to live far away from the Dr. Isidore Moosffice so he is wondering if you could recommend a [place] to instead. Please Advise.

## 2017-12-21 NOTE — Telephone Encounter (Signed)
Forwarded to [facility]. Placed order. And placed a comment in referral for [facility]

## 2017-12-21 NOTE — Telephone Encounter (Signed)
I do not know of any GI in [place], you could see if Connor [name] could refer him to any in that direction.

## 2017-12-28 ENCOUNTER — Other Ambulatory Visit: Payer: Self-pay | Admitting: *Deleted

## 2017-12-28 MED ORDER — AMPHETAMINE-DEXTROAMPHET ER 10 MG PO CP24: ORAL_CAPSULE | ORAL | 0 refills | Status: DC

## 2017-12-28 NOTE — Telephone Encounter (Signed)
Patient requested. NCCSRS Database Verified Pharmacy Confirmed Pended Rx and sent to Jessica for approval.  

## 2018-01-21 ENCOUNTER — Ambulatory Visit (INDEPENDENT_AMBULATORY_CARE_PROVIDER_SITE_OTHER): Payer: BLUE CROSS/BLUE SHIELD | Admitting: Orthopedic Surgery

## 2018-01-21 ENCOUNTER — Encounter (INDEPENDENT_AMBULATORY_CARE_PROVIDER_SITE_OTHER): Payer: Self-pay | Admitting: Orthopedic Surgery

## 2018-01-21 DIAGNOSIS — M7541 Impingement syndrome of right shoulder: Secondary | ICD-10-CM | POA: Diagnosis not present

## 2018-01-21 NOTE — Progress Notes (Signed)
Office Visit Note   Patient: Connor [name]           Date of Birth: [birth year]           MRN: [id number] Visit Date: 01/21/2018 Requested by: Sharon Seller, NP [address] [place] [place], [place] [address] PCP: Sharon Seller, NP  Subjective: Chief Complaint  Patient presents with  . Right Shoulder - Follow-up    HPI: Connor [name] is a 62 year old patient here for recheck on his right shoulder.  He was last seen 12/10/2017.  He has been doing some better.  He has been taking meloxicam.  He is not waking with the pain anymore at night.  Only one night a week as opposed to 7 nights a week.  Reports decrease in intensity of pain.              ROS: All systems reviewed are negative as they relate to the chief complaint within the history of present illness.  Patient denies  fevers or chills.  Assessment & Plan: Visit Diagnoses: No diagnosis found.  Plan: Impression is improvement in right shoulder pain.  Plan is to taper off the Mobic going every other day for the next 3 weeks and then transitioning back to Aleve.  If his symptoms recur come back in for injection and scheduling of MRI scan.  Differential diagnosis at this time is bursitis versus biceps tendinitis versus AC joint arthritis.  Follow-Up Instructions: No Follow-up on file.   Orders:  No orders of the defined types were placed in this encounter.  No orders of the defined types were placed in this encounter.     Procedures: No procedures performed   Clinical Data: No additional findings.  Objective: Vital Signs: There were no vitals taken for this visit.  Physical Exam:   Constitutional: Patient appears well-developed HEENT:  Head: Normocephalic Eyes:EOM are normal Neck: Normal range of motion Cardiovascular: Normal rate Pulmonary/chest: Effort normal Neurologic: Patient is alert Skin: Skin is warm Psychiatric: Patient has normal mood and affect    Ortho Exam: Orthopedic exam demonstrates full  active and passive mention with the right shoulder.  Excellent rotator cuff strength on the right.  Negative O'Brien'[name] testing.  No discrete AC joint tenderness.  Does have a little bit of tenderness at the anterolateral margin of the acromion.  No coarse grinding or crepitus in this area.  No other masses lymphadenopathy or skin changes noted in the shoulder girdle region.  Specialty Comments:  No specialty comments available.  Imaging: No results found.   PMFS History: Patient Active Problem List   Diagnosis Date Noted  . Biceps tendonitis 05/30/2017  . Marital conflict 11/14/2016  . Screening for prostate cancer 05/09/2016  . Eczema 11/09/2015  . Palpitation 05/12/2015  . Hyperglycemia 05/12/2015  . Diverticulosis of colon without hemorrhage 12/14/2014  . Adjustment disorder with anxiety 07/13/2014  . Adjustment insomnia 07/13/2014  . Benign essential HTN 07/13/2014  . Allergic rhinitis 07/13/2014  . Chronic tension-type headache, intractable 07/13/2014  . Thrombosed external hemorrhoid 04/15/2014  . Pain in joint, ankle and foot 10/29/2013  . Pain in joint, shoulder region 10/29/2013  . Tympanic membrane disorder 10/29/2013  . Hyperlipidemia 05/03/2013  . ADD (attention deficit disorder)   . Irritable bowel syndrome (IBS)   . Lumbago    Past Medical History:  Diagnosis Date  . Abdominal pain, left lower quadrant   . Actinic keratosis   . ADD (attention deficit disorder)   . Alcohol abuse,  unspecified   . Anxiety   . Anxiety state, unspecified   . Arthritis    shoulders and hips  . Atrial fibrillation (HCC)   . Attention deficit disorder without mention of hyperactivity   . Cervicalgia   . Depressive disorder, not elsewhere classified   . Eczema 11/2011   right hand  . Edema   . Encounter for long-term (current) use of other medications   . Headache(784.0)    tension or sinus  . High cholesterol   . Inguinal hernia 11/2011   right  . Inguinal hernia without  mention of obstruction or gangrene, unilateral or unspecified, (not specified as recurrent)   . Insomnia, unspecified   . Internal hemorrhoids without mention of complication   . Irritable bowel syndrome   . Irritable bowel syndrome (IBS)   . Lumbago   . Major depressive disorder, recurrent episode, severe, without mention of psychotic behavior   . Obesity, unspecified   . Other and unspecified hyperlipidemia   . Other atopic dermatitis and related conditions   . Other disorders of vitreous   . Other seborrheic keratosis   . Other specified visual disturbances   . Pain in joint, pelvic region and thigh   . Pain in joint, site unspecified   . Pathologic fracture of vertebrae   . Poisoning and toxic reactions caused by other specified animals and plants   . Recent retinal detachment, partial, with single defect   . Routine general medical examination at a health care facility   . Sciatica   . Sinus infection 11/2011   started antibiotic 12/18/2011 x 7 days; current cough  . Special screening for malignant neoplasm of prostate   . Tension headache   . Type II or unspecified type diabetes mellitus without mention of complication, not stated as uncontrolled   . Unspecified essential hypertension     Family History  Problem Relation Age of Onset  . Stroke Father   . Cancer Brother        prostate  . Cancer Paternal Grandfather        colon    Past Surgical History:  Procedure Laterality Date  . APPENDECTOMY  1987  . CATARACT EXTRACTION  2011   right eye  . CATARACT EXTRACTION W/ INTRAOCULAR LENS IMPLANT Left 04/04/2017   Dr. Dawna [name]  . HEMORRHOID SURGERY  04/15/14   thrombosed int. Hemorrhoid. Dr. Violeta [name]  . HERNIA REPAIR  05/03/2011   left  . INGUINAL HERNIA REPAIR  12/28/2011   Procedure: HERNIA REPAIR INGUINAL ADULT;  Surgeon: Connor Loron, MD;  Location: [facility];  Service: General;  Laterality: Right;  . NASAL POLYP SURGERY  01/05/2011   DR.  NEWMAN   . NASAL SINUS SURGERY  12/2010  . removed anal warts  1976   Dr Connor [name]   . RETINAL DETACHMENT REPAIR W/ SCLERAL BUCKLE LE  07/22/2008   right eye; pars plana vitrectomy  . TONSILLECTOMY AND ADENOIDECTOMY  1964  . TYMPANOSTOMY TUBE PLACEMENT  June 2015   Dr. Salena [name]. Connor Standing   Social History   Occupational History  . Not on file  Tobacco Use  . Smoking status: Former Smoker    Types: Cigarettes    Last attempt to quit: 05/29/1997    Years since quitting: 20.6  . Smokeless tobacco: Never Used  Substance and Sexual Activity  . Alcohol use: Yes    Alcohol/week: 0.0 oz    Comment: 1-3 beers/day  . Drug use: No  . Sexual  activity: Yes    Partners: Female    Comment: Wife

## 2018-01-28 ENCOUNTER — Other Ambulatory Visit: Payer: Self-pay | Admitting: *Deleted

## 2018-01-28 MED ORDER — AMPHETAMINE-DEXTROAMPHET ER 10 MG PO CP24: ORAL_CAPSULE | ORAL | 0 refills | Status: DC

## 2018-01-28 NOTE — Telephone Encounter (Signed)
Patient called requesting refill on medication.  NCCSRS Database Verified.  Pharmacy Confirmed.  [facility]

## 2018-02-06 LAB — HM COLONOSCOPY

## 2018-02-22 ENCOUNTER — Encounter: Payer: Self-pay | Admitting: *Deleted

## 2018-02-26 DIAGNOSIS — H52223 Regular astigmatism, bilateral: Secondary | ICD-10-CM | POA: Diagnosis not present

## 2018-02-26 DIAGNOSIS — H524 Presbyopia: Secondary | ICD-10-CM | POA: Diagnosis not present

## 2018-02-26 DIAGNOSIS — Z961 Presence of intraocular lens: Secondary | ICD-10-CM | POA: Diagnosis not present

## 2018-02-26 DIAGNOSIS — H338 Other retinal detachments: Secondary | ICD-10-CM | POA: Diagnosis not present

## 2018-02-26 DIAGNOSIS — H5213 Myopia, bilateral: Secondary | ICD-10-CM | POA: Diagnosis not present

## 2018-02-27 ENCOUNTER — Other Ambulatory Visit: Payer: Self-pay | Admitting: *Deleted

## 2018-02-27 MED ORDER — AMPHETAMINE-DEXTROAMPHET ER 10 MG PO CP24: ORAL_CAPSULE | ORAL | 0 refills | Status: DC

## 2018-02-27 NOTE — Telephone Encounter (Signed)
Patient requested refill NCCSRS Database Verified Pharmacy Confirmed [facility].

## 2018-03-28 ENCOUNTER — Other Ambulatory Visit: Payer: Self-pay | Admitting: Nurse Practitioner

## 2018-03-28 DIAGNOSIS — F419 Anxiety disorder, unspecified: Secondary | ICD-10-CM

## 2018-03-29 ENCOUNTER — Other Ambulatory Visit: Payer: Self-pay | Admitting: *Deleted

## 2018-03-29 MED ORDER — AMPHETAMINE-DEXTROAMPHET ER 10 MG PO CP24: ORAL_CAPSULE | ORAL | 0 refills | Status: DC

## 2018-03-29 NOTE — Telephone Encounter (Signed)
Patient called requesting refill on his Adderall.  NCCSRS Database Verified LR: 02/27/18 Pharmacy Confirmed [facility]

## 2018-03-29 NOTE — Telephone Encounter (Signed)
A medication refill was received from pharmacy for clonazepam 0.5 mg. Rx was called in to pharmacy after verifying last fill date, provider, and quantity on [place] database.    

## 2018-04-29 ENCOUNTER — Other Ambulatory Visit: Payer: Self-pay | Admitting: *Deleted

## 2018-04-29 MED ORDER — AMPHETAMINE-DEXTROAMPHET ER 10 MG PO CP24: ORAL_CAPSULE | ORAL | 0 refills | Status: DC

## 2018-04-29 NOTE — Telephone Encounter (Signed)
Patient requested NCCSRS Database Verified LR: 03/29/2018 Pended Rx and sent to [facility] for approval.

## 2018-05-28 ENCOUNTER — Other Ambulatory Visit: Payer: 59

## 2018-05-28 DIAGNOSIS — E785 Hyperlipidemia, unspecified: Secondary | ICD-10-CM | POA: Diagnosis not present

## 2018-05-28 DIAGNOSIS — R739 Hyperglycemia, unspecified: Secondary | ICD-10-CM

## 2018-05-29 LAB — COMPLETE METABOLIC PANEL WITH GFR
AG RATIO: 2.2 (calc) (ref 1.0–2.5)
ALT: 23 U/L (ref 9–46)
AST: 20 U/L (ref 10–35)
Albumin: 4.6 g/dL (ref 3.6–5.1)
Alkaline phosphatase (APISO): 58 U/L (ref 40–115)
BILIRUBIN TOTAL: 0.6 mg/dL (ref 0.2–1.2)
BUN: 15 mg/dL (ref 7–25)
CHLORIDE: 101 mmol/L (ref 98–110)
CO2: 28 mmol/L (ref 20–32)
Calcium: 9.4 mg/dL (ref 8.6–10.3)
Creat: 0.88 mg/dL (ref 0.70–1.25)
GFR, EST AFRICAN AMERICAN: 108 mL/min/{1.73_m2} (ref >=60)
GFR, EST NON AFRICAN AMERICAN: 93 mL/min/{1.73_m2} (ref >=60)
GLOBULIN: 2.1 g/dL (ref 1.9–3.7)
Glucose, Bld: 93 mg/dL (ref 65–99)
POTASSIUM: 4.2 mmol/L (ref 3.5–5.3)
SODIUM: 137 mmol/L (ref 135–146)
TOTAL PROTEIN: 6.7 g/dL (ref 6.1–8.1)

## 2018-05-29 LAB — HEMOGLOBIN A1C
EAG (MMOL/L): 5.5 (calc)
Hgb A1c MFr Bld: 5.1 % of total Hgb (ref <5.7)
MEAN PLASMA GLUCOSE: 100 (calc)

## 2018-05-30 ENCOUNTER — Ambulatory Visit: Payer: 59 | Admitting: Nurse Practitioner

## 2018-05-30 ENCOUNTER — Encounter: Payer: Self-pay | Admitting: Nurse Practitioner

## 2018-05-30 VITALS — BP 148/88 | HR 88 | Temp 98.7°F | Ht 70.0 in | Wt 177.6 lb

## 2018-05-30 DIAGNOSIS — R69 Illness, unspecified: Secondary | ICD-10-CM | POA: Diagnosis not present

## 2018-05-30 DIAGNOSIS — G44221 Chronic tension-type headache, intractable: Secondary | ICD-10-CM

## 2018-05-30 DIAGNOSIS — E785 Hyperlipidemia, unspecified: Secondary | ICD-10-CM | POA: Diagnosis not present

## 2018-05-30 DIAGNOSIS — F902 Attention-deficit hyperactivity disorder, combined type: Secondary | ICD-10-CM | POA: Diagnosis not present

## 2018-05-30 DIAGNOSIS — F419 Anxiety disorder, unspecified: Secondary | ICD-10-CM

## 2018-05-30 DIAGNOSIS — I10 Essential (primary) hypertension: Secondary | ICD-10-CM | POA: Diagnosis not present

## 2018-05-30 DIAGNOSIS — R739 Hyperglycemia, unspecified: Secondary | ICD-10-CM | POA: Diagnosis not present

## 2018-05-30 DIAGNOSIS — M720 Palmar fascial fibromatosis [Dupuytren]: Secondary | ICD-10-CM | POA: Diagnosis not present

## 2018-05-30 MED ORDER — AMPHETAMINE-DEXTROAMPHET ER 10 MG PO CP24: ORAL_CAPSULE | ORAL | 0 refills | Status: DC

## 2018-05-30 MED ORDER — BUTALBITAL-APAP-CAFFEINE 50-325-40 MG PO TABS: ORAL_TABLET | ORAL | 1 refills | Status: DC

## 2018-05-30 NOTE — Patient Instructions (Signed)
To check blood pressure a few times a week for the next 2 weeks and let us know results via mychart  Follow up in 6 months with fasting blood work prior to visit.    DASH Eating Plan DASH stands for "Dietary Approaches to Stop Hypertension." The DASH eating plan is a healthy eating plan that has been shown to reduce high blood pressure (hypertension). It may also reduce your risk for type 2 diabetes, heart disease, and stroke. The DASH eating plan may also help with weight loss. What are tips for following this plan? General guidelines  Avoid eating more than 2,300 mg (milligrams) of salt (sodium) a day. If you have hypertension, you may need to reduce your sodium intake to 1,500 mg a day.  Limit alcohol intake to no more than 1 drink a day for nonpregnant women and 2 drinks a day for men. One drink equals 12 oz of beer, 5 oz of wine, or 1 oz of hard liquor.  Work with your health care provider to maintain a healthy body weight or to lose weight. Ask what an ideal weight is for you.  Get at least 30 minutes of exercise that causes your heart to beat faster (aerobic exercise) most days of the week. Activities may include walking, swimming, or biking.  Work with your health care provider or diet and nutrition specialist (dietitian) to adjust your eating plan to your individual calorie needs. Reading food labels  Check food labels for the amount of sodium per serving. Choose foods with less than 5 percent of the Daily Value of sodium. Generally, foods with less than 300 mg of sodium per serving fit into this eating plan.  To find whole grains, look for the word "whole" as the first word in the ingredient list. Shopping  Buy products labeled as "low-sodium" or "no salt added."  Buy fresh foods. Avoid canned foods and premade or frozen meals. Cooking  Avoid adding salt when cooking. Use salt-free seasonings or herbs instead of table salt or sea salt. Check with your health care provider or  pharmacist before using salt substitutes.  Do not fry foods. Cook foods using healthy methods such as baking, boiling, grilling, and broiling instead.  Cook with heart-healthy oils, such as olive, canola, soybean, or sunflower oil. Meal planning   Eat a balanced diet that includes: ? 5 or more servings of fruits and vegetables each day. At each meal, try to fill half of your plate with fruits and vegetables. ? Up to 6-8 servings of whole grains each day. ? Less than 6 oz of lean meat, poultry, or fish each day. A 3-oz serving of meat is about the same size as a deck of cards. One egg equals 1 oz. ? 2 servings of low-fat dairy each day. ? A serving of nuts, seeds, or beans 5 times each week. ? Heart-healthy fats. Healthy fats called Omega-3 fatty acids are found in foods such as flaxseeds and coldwater fish, like sardines, salmon, and mackerel.  Limit how much you eat of the following: ? Canned or prepackaged foods. ? Food that is high in trans fat, such as fried foods. ? Food that is high in saturated fat, such as fatty meat. ? Sweets, desserts, sugary drinks, and other foods with added sugar. ? Full-fat dairy products.  Do not salt foods before eating.  Try to eat at least 2 vegetarian meals each week.  Eat more home-cooked food and less restaurant, buffet, and fast food.  When  eating at a restaurant, ask that your food be prepared with less salt or no salt, if possible. What foods are recommended? The items listed may not be a complete list. Talk with your dietitian about what dietary choices are best for you. Grains Whole-grain or whole-wheat bread. Whole-grain or whole-wheat pasta. Brown rice. [place]ena [place]row. Bulgur. Whole-grain and low-sodium cereals. Pita bread. Low-fat, low-sodium crackers. Whole-wheat flour tortillas. Vegetables Fresh or frozen vegetables (raw, steamed, roasted, or grilled). Low-sodium or reduced-sodium tomato and vegetable juice. Low-sodium or  reduced-sodium tomato sauce and tomato paste. Low-sodium or reduced-sodium canned vegetables. Fruits All fresh, dried, or frozen fruit. Canned fruit in natural juice (without added sugar). Meat and other protein foods Skinless chicken or [place]. Ground chicken or [place]. Pork with fat trimmed off. Fish and seafood. Egg whites. Dried beans, peas, or lentils. Unsalted nuts, nut butters, and seeds. Unsalted canned beans. Lean cuts of beef with fat trimmed off. Low-sodium, lean deli meat. Dairy Low-fat (1%) or fat-free (skim) milk. Fat-free, low-fat, or reduced-fat cheeses. Nonfat, low-sodium ricotta or cottage cheese. Low-fat or nonfat yogurt. Low-fat, low-sodium cheese. Fats and oils Soft margarine without trans fats. Vegetable oil. Low-fat, reduced-fat, or light mayonnaise and salad dressings (reduced-sodium). Canola, safflower, olive, soybean, and sunflower oils. Avocado. Seasoning and other foods Herbs. Spices. Seasoning mixes without salt. Unsalted popcorn and pretzels. Fat-free sweets. What foods are not recommended? The items listed may not be a complete list. Talk with your dietitian about what dietary choices are best for you. Grains Baked goods made with fat, such as croissants, muffins, or some breads. Dry pasta or rice meal packs. Vegetables Creamed or fried vegetables. Vegetables in a cheese sauce. Regular canned vegetables (not low-sodium or reduced-sodium). Regular canned tomato sauce and paste (not low-sodium or reduced-sodium). Regular tomato and vegetable juice (not low-sodium or reduced-sodium). Angie Fava. Olives. Fruits Canned fruit in a light or heavy syrup. Fried fruit. Fruit in cream or butter sauce. Meat and other protein foods Fatty cuts of meat. Ribs. Fried meat. Berniece Salines. Sausage. Bologna and other processed lunch meats. Salami. Fatback. Hotdogs. Bratwurst. Salted nuts and seeds. Canned beans with added salt. Canned or smoked fish. Whole eggs or egg yolks. Chicken or [place]  with skin. Dairy Whole or 2% milk, cream, and half-and-half. Whole or full-fat cream cheese. Whole-fat or sweetened yogurt. Full-fat cheese. Nondairy creamers. Whipped toppings. Processed cheese and cheese spreads. Fats and oils Butter. Stick margarine. Lard. Shortening. Ghee. Bacon fat. Tropical oils, such as coconut, palm kernel, or palm oil. Seasoning and other foods Salted popcorn and pretzels. Onion salt, garlic salt, seasoned salt, table salt, and sea salt. Worcestershire sauce. Tartar sauce. Barbecue sauce. Teriyaki sauce. Soy sauce, including reduced-sodium. Steak sauce. Canned and packaged gravies. Fish sauce. Oyster sauce. Cocktail sauce. Horseradish that you find on the shelf. Ketchup. Mustard. Meat flavorings and tenderizers. Bouillon cubes. Hot sauce and Tabasco sauce. Premade or packaged marinades. Premade or packaged taco seasonings. Relishes. Regular salad dressings. Where to find more information:  National Heart, Lung, and [place]: https://M[place]ge-eaton.com/  American Heart Association: www.heart.org Summary  The DASH eating plan is a healthy eating plan that has been shown to reduce high blood pressure (hypertension). It may also reduce your risk for type 2 diabetes, heart disease, and stroke.  With the DASH eating plan, you should limit salt (sodium) intake to 2,300 mg a day. If you have hypertension, you may need to reduce your sodium intake to 1,500 mg a day.  When on the DASH eating plan, aim  to eat more fresh fruits and vegetables, whole grains, lean proteins, low-fat dairy, and heart-healthy fats.  Work with your health care provider or diet and nutrition specialist (dietitian) to adjust your eating plan to your individual calorie needs. This information is not intended to replace advice given to you by your health care provider. Make sure you discuss any questions you have with your health care provider. Document Released: 11/30/2011 Document Revised: 12/04/2016  Document Reviewed: 12/04/2016 Elsevier Interactive Patient Education  [employer].

## 2018-05-30 NOTE — Progress Notes (Addendum)
Careteam: Patient Care Team: Sharon Seller, NP as PCP - General (Geriatric Medicine) Drema Halon, MD as Consulting Physician (Otolaryngology) Violeta Gelinas, MD as Consulting Physician (General Surgery) Jethro Bolus, MD as Consulting Physician (Ophthalmology) Meryl Dare, MD as Consulting Physician (Gastroenterology) Emelia Loron, MD as Consulting Physician (General Surgery) Nita Sells, MD (Dermatology) Webb Silversmith, MD (Gastroenterology)  Advanced Directive information    Allergies  Allergen Reactions  . Amitriptyline   . Zolpidem Tartrate Other (See Comments)    Severe headache  . Amoxicillin-Pot Clavulanate Rash  . Propoxyphene Hcl Nausea Only    Chief Complaint  Patient presents with  . Medical Management of Chronic Issues    Pt is being seen for a 6 month routine visit. Pt has a spot on the outside of left elbow that he would like to discuss.   . Medication Refill    Rx for adderall and fioricet have been pended.   . Forms    Pt has bio- metric forms to be completed for employer     HPI: Patient is a 62 y.o. male seen in the office today for routine follow up.   HTN- Borderline needing medication, trying to manage with diet, watching sodium but due to work and other things it is hard to maintain. Anxious today.  Also has not check blood pressure at home recently   Headache- uses twice a month.   ADD- continues to use adderall daily which helps focus on work. Generally uses only at work.   Anxiety/insomnia - uses for sleep and anxiety which has been effective. Works well without side effects.   Hyperlipidemia- continues on lovastatin LDL 105 in December.   dupuytren's contracture to bilateral hands, more progressive to left hand vs right. Without significant tenderness or decrease ROM Review of Systems:  Review of Systems  Constitutional: Negative for chills, fever and malaise/fatigue.  HENT: Positive for congestion. Negative  for ear pain, hearing loss, sinus pain and sore throat.   Respiratory: Negative for cough, sputum production, shortness of breath and wheezing.   Cardiovascular: Negative for chest pain.  Gastrointestinal: Negative for abdominal pain, diarrhea and vomiting.  Musculoskeletal: Negative for joint pain and myalgias.  Neurological: Positive for headaches (occasional). Negative for dizziness.    Past Medical History:  Diagnosis Date  . Abdominal pain, left lower quadrant   . Actinic keratosis   . ADD (attention deficit disorder)   . Alcohol abuse, unspecified   . Anxiety   . Anxiety state, unspecified   . Arthritis    shoulders and hips  . Atrial fibrillation (HCC)   . Attention deficit disorder without mention of hyperactivity   . Cervicalgia   . Depressive disorder, not elsewhere classified   . Eczema 11/2011   right hand  . Edema   . Encounter for long-term (current) use of other medications   . Headache(784.0)    tension or sinus  . High cholesterol   . Inguinal hernia 11/2011   right  . Inguinal hernia without mention of obstruction or gangrene, unilateral or unspecified, (not specified as recurrent)   . Insomnia, unspecified   . Internal hemorrhoids without mention of complication   . Irritable bowel syndrome   . Irritable bowel syndrome (IBS)   . Lumbago   . Major depressive disorder, recurrent episode, severe, without mention of psychotic behavior   . Obesity, unspecified   . Other and unspecified hyperlipidemia   . Other atopic dermatitis and related conditions   .  Other disorders of vitreous   . Other seborrheic keratosis   . Other specified visual disturbances   . Pain in joint, pelvic region and thigh   . Pain in joint, site unspecified   . Pathologic fracture of vertebrae   . Poisoning and toxic reactions caused by other specified animals and plants   . Recent retinal detachment, partial, with single defect   . Routine general medical examination at a health  care facility   . Sciatica   . Sinus infection 11/2011   started antibiotic 12/18/2011 x 7 days; current cough  . Special screening for malignant neoplasm of prostate   . Tension headache   . Type II or unspecified type diabetes mellitus without mention of complication, not stated as uncontrolled   . Unspecified essential hypertension    Past Surgical History:  Procedure Laterality Date  . APPENDECTOMY  1987  . CATARACT EXTRACTION  2011   right eye  . CATARACT EXTRACTION W/ INTRAOCULAR LENS IMPLANT Left 04/04/2017   Dr. Dawna Part  . HEMORRHOID SURGERY  04/15/14   thrombosed int. Hemorrhoid. Dr. Violeta Gelinas  . HERNIA REPAIR  05/03/2011   left  . INGUINAL HERNIA REPAIR  12/28/2011   Procedure: HERNIA REPAIR INGUINAL ADULT;  Surgeon: Emelia Loron, MD;  Location: [facility];  Service: General;  Laterality: Right;  . NASAL POLYP SURGERY  01/05/2011   DR. NEWMAN   . NASAL SINUS SURGERY  12/2010  . removed anal warts  1976   Dr Terri Piedra   . RETINAL DETACHMENT REPAIR W/ SCLERAL BUCKLE LE  07/22/2008   right eye; pars plana vitrectomy  . TONSILLECTOMY AND ADENOIDECTOMY  1964  . TYMPANOSTOMY TUBE PLACEMENT  June 2015   Dr. Salena Saner. [place]   Social History:   reports that he quit smoking about 21 years ago. His smoking use included cigarettes. He has never used smokeless tobacco. He reports that he drinks alcohol. He reports that he does not use drugs.  Family History  Problem Relation Age of Onset  . Stroke Father   . Cancer Brother        prostate  . Cancer Paternal Grandfather        colon    Medications: Patient's Medications  New Prescriptions   No medications on file  Previous Medications   AMPHETAMINE-DEXTROAMPHETAMINE (ADDERALL XR) 10 MG 24 HR CAPSULE    Take one tablet once a day for ADD   BUTALBITAL-ACETAMINOPHEN-CAFFEINE (FIORICET) 50-325-40 MG TABLET    Take one tablet four times a day as needed for headaches   CLONAZEPAM (KLONOPIN) 0.5 MG TABLET    TAKE 1  TABLET BY MOUTH THREE TIMES DAILY AS NEEDED   DICYCLOMINE (BENTYL) 20 MG TABLET    take 1 tablet by mouth every 6 hours if needed TO CONTROL PAIN FROM IRRITABLE BOWEL SYNDROME   FLUTICASONE (FLONASE) 50 MCG/ACT NASAL SPRAY    Place 2 sprays into the nose as needed.    HALOBETASOL (ULTRAVATE) 0.05 % CREAM    apply to rash UP TO 3 TIMES A DAY   LOVASTATIN (MEVACOR) 20 MG TABLET    Take one tablet by mouth at bedtime to control cholesterol   MULTIPLE VITAMIN (MULTIVITAMIN) TABLET    Take 1 tablet by mouth daily.    Modified Medications   No medications on file  Discontinued Medications   CRISABOROLE (EUCRISA) 2 % OINT    Apply 1 application topically 2 (two) times daily.   MELOXICAM (MOBIC) 15 MG TABLET  1 po q d x 6 weeks     Physical Exam:  Vitals:   05/30/18 0825  BP: (!) 148/88  Pulse: 88  Temp: 98.7 F (37.1 C)  TempSrc: Oral  SpO2: 97%  Weight: 177 lb 9.6 oz (80.6 kg)  Height: 5\' 10"  (1.778 m)   Body mass index is 25.48 kg/m.  Physical Exam  Constitutional: He is oriented to person, place, and time. He appears well-developed and well-nourished. No distress.  HENT:  Head: Normocephalic and atraumatic.  Nose: Nose normal.  Eyes: Pupils are equal, round, and reactive to light. Conjunctivae and EOM are normal.  Cardiovascular: Normal rate, regular rhythm and normal heart sounds. Exam reveals no gallop and no friction rub.  No murmur heard. Pulmonary/Chest: Effort normal and breath sounds normal.  Abdominal: Soft. Bowel sounds are normal.  Musculoskeletal: Normal range of motion. He exhibits no edema or tenderness.  Neurological: He is alert and oriented to person, place, and time. No cranial nerve deficit. Coordination normal.  Skin: Skin is warm and dry.  Small irritated hair follicle to left elbow, no infection noted  Psychiatric: He has a normal mood and affect. His behavior is normal. Judgment and thought content normal.    Labs reviewed: Basic Metabolic  Panel: Recent Labs    11/27/17 0820 05/28/18 0814  NA 138 137  K 4.7 4.2  CL 104 101  CO2 28 28  GLUCOSE 120* 93  BUN 17 15  CREATININE 1.07 0.88  CALCIUM 9.2 9.4   Liver Function Tests: Recent Labs    05/28/18 0814  AST 20  ALT 23  BILITOT 0.6  PROT 6.7   No results for input(s): LIPASE, AMYLASE in the last 8760 hours. No results for input(s): AMMONIA in the last 8760 hours. CBC: Recent Labs    11/29/17 0938  WBC 8.6  NEUTROABS 5,435  HGB 13.9  HCT 41.2  MCV 93.4  PLT 203   Lipid Panel: Recent Labs    11/27/17 0820  CHOL 185  HDL 63  LDLCALC 105*  TRIG 81  CHOLHDL 2.9   TSH: No results for input(s): TSH in the last 8760 hours. A1C: Lab Results  Component Value Date   HGBA1C 5.1 05/28/2018     Assessment/Plan 1. Chronic tension-type headache, intractable Uses Fioricet occasionally with good relief, refill provided  - butalbital-acetaminophen-caffeine (FIORICET) 50-325-40 MG tablet; Take one tablet daily as needed for headache  Dispense: 30 tablet; Refill: 1  2. Attention deficit hyperactivity disorder (ADHD), combined type - amphetamine-dextroamphetamine (ADDERALL XR) 10 MG 24 hr capsule; Take one tablet once a day for ADD  Dispense: 30 capsule; Refill: 0  3. Hyperglycemia A1c 5.1, improved on recent lab  4. Hyperlipidemia, unspecified hyperlipidemia type -LDL of 105 in December, continues on lovastatin, continue current regimen at this time.  - Lipid Panel; Future - CMP; Future  5. Anxiety -controlled, uses clonazepam PRN  6. Benign essential HTN -elevated today, reports being anxious at office ? White coat syndrome, try to maintain low sodium diet, dash diet given.  -will take blood pressure at home and report via mychart in the next 2 weeks  - CBC with Differential/Platelets; Future  7. Dupuytren's contracture of both hands Will monitor, may need ortho referral if progresses.   Advance directive paperwork given  Next appt:  11/25/2018 Janene HarveyJessica K. Biagio BorgEubanks, AGNP  [facility] [phone]

## 2018-07-01 ENCOUNTER — Other Ambulatory Visit: Payer: Self-pay

## 2018-07-01 DIAGNOSIS — F902 Attention-deficit hyperactivity disorder, combined type: Secondary | ICD-10-CM

## 2018-07-01 MED ORDER — AMPHETAMINE-DEXTROAMPHET ER 10 MG PO CP24: ORAL_CAPSULE | ORAL | 0 refills | Status: DC

## 2018-07-01 NOTE — Telephone Encounter (Signed)
[place] Database verified and compliance confirmed   Patient aware I will call him once rx sent electronically

## 2018-07-01 NOTE — Telephone Encounter (Signed)
Patient aware rx called in  

## 2018-07-05 ENCOUNTER — Other Ambulatory Visit: Payer: Self-pay | Admitting: Internal Medicine

## 2018-07-12 DIAGNOSIS — H6501 Acute serous otitis media, right ear: Secondary | ICD-10-CM | POA: Diagnosis not present

## 2018-07-12 DIAGNOSIS — J069 Acute upper respiratory infection, unspecified: Secondary | ICD-10-CM | POA: Diagnosis not present

## 2018-07-15 DIAGNOSIS — H66003 Acute suppurative otitis media without spontaneous rupture of ear drum, bilateral: Secondary | ICD-10-CM | POA: Diagnosis not present

## 2018-07-17 ENCOUNTER — Other Ambulatory Visit: Payer: Self-pay | Admitting: Nurse Practitioner

## 2018-07-17 DIAGNOSIS — F419 Anxiety disorder, unspecified: Secondary | ICD-10-CM

## 2018-07-17 NOTE — Telephone Encounter (Signed)
A medication refill was received from pharmacy for clonazepam 0.5 mg. Rx was called in to pharmacy after verifying last fill date, provider, and quantity on [place] database.    

## 2018-07-31 ENCOUNTER — Other Ambulatory Visit: Payer: Self-pay | Admitting: *Deleted

## 2018-07-31 DIAGNOSIS — F902 Attention-deficit hyperactivity disorder, combined type: Secondary | ICD-10-CM

## 2018-07-31 MED ORDER — AMPHETAMINE-DEXTROAMPHET ER 10 MG PO CP24: ORAL_CAPSULE | ORAL | 0 refills | Status: DC

## 2018-07-31 NOTE — Telephone Encounter (Signed)
[name] requested refill [place] Database Verified LR: 07/01/2018 Pended Rx and sent to Dr. Montez Moritaarter for approval.  (Connor [name])

## 2018-08-19 DIAGNOSIS — H35371 Puckering of macula, right eye: Secondary | ICD-10-CM | POA: Diagnosis not present

## 2018-08-19 DIAGNOSIS — T8522XA Displacement of intraocular lens, initial encounter: Secondary | ICD-10-CM | POA: Diagnosis not present

## 2018-08-19 DIAGNOSIS — H43812 Vitreous degeneration, left eye: Secondary | ICD-10-CM | POA: Diagnosis not present

## 2018-08-30 ENCOUNTER — Other Ambulatory Visit: Payer: Self-pay

## 2018-08-30 DIAGNOSIS — F902 Attention-deficit hyperactivity disorder, combined type: Secondary | ICD-10-CM

## 2018-08-30 MED ORDER — AMPHETAMINE-DEXTROAMPHET ER 10 MG PO CP24: ORAL_CAPSULE | ORAL | 0 refills | Status: DC

## 2018-08-30 NOTE — Telephone Encounter (Signed)
Adderall XR Refill Request, last filled 07/31/18  [place] Database verified and compliance confirmed

## 2018-09-02 DIAGNOSIS — T8522XA Displacement of intraocular lens, initial encounter: Secondary | ICD-10-CM | POA: Diagnosis not present

## 2018-09-13 DIAGNOSIS — T8522XA Displacement of intraocular lens, initial encounter: Secondary | ICD-10-CM | POA: Diagnosis not present

## 2018-09-13 HISTORY — PX: EYE SURGERY: SHX253

## 2018-09-20 DIAGNOSIS — H40051 Ocular hypertension, right eye: Secondary | ICD-10-CM | POA: Diagnosis not present

## 2018-09-20 DIAGNOSIS — H40041 Steroid responder, right eye: Secondary | ICD-10-CM | POA: Diagnosis not present

## 2018-09-25 ENCOUNTER — Encounter: Payer: Self-pay | Admitting: Nurse Practitioner

## 2018-09-30 ENCOUNTER — Other Ambulatory Visit: Payer: Self-pay | Admitting: *Deleted

## 2018-09-30 DIAGNOSIS — F902 Attention-deficit hyperactivity disorder, combined type: Secondary | ICD-10-CM

## 2018-09-30 MED ORDER — AMPHETAMINE-DEXTROAMPHET ER 10 MG PO CP24: ORAL_CAPSULE | ORAL | 0 refills | Status: DC

## 2018-09-30 NOTE — Telephone Encounter (Signed)
Patient requested refill [place] Database Verified LR: 08/30/2018 Confirmed Pharmacy Pended Rx and sent to [facility] for approval.

## 2018-10-02 ENCOUNTER — Other Ambulatory Visit: Payer: Self-pay | Admitting: Nurse Practitioner

## 2018-10-24 DIAGNOSIS — H43812 Vitreous degeneration, left eye: Secondary | ICD-10-CM | POA: Diagnosis not present

## 2018-10-30 ENCOUNTER — Other Ambulatory Visit: Payer: Self-pay | Admitting: *Deleted

## 2018-10-30 DIAGNOSIS — F902 Attention-deficit hyperactivity disorder, combined type: Secondary | ICD-10-CM

## 2018-10-30 MED ORDER — AMPHETAMINE-DEXTROAMPHET ER 10 MG PO CP24: ORAL_CAPSULE | ORAL | 0 refills | Status: DC

## 2018-10-30 NOTE — Telephone Encounter (Signed)
Patient requested refill [place] Database Verified LR: 09/30/2018 Pended Rx and sent to [facility] for approval.

## 2018-10-31 DIAGNOSIS — H338 Other retinal detachments: Secondary | ICD-10-CM | POA: Diagnosis not present

## 2018-10-31 DIAGNOSIS — Z961 Presence of intraocular lens: Secondary | ICD-10-CM | POA: Diagnosis not present

## 2018-10-31 DIAGNOSIS — H52223 Regular astigmatism, bilateral: Secondary | ICD-10-CM | POA: Diagnosis not present

## 2018-10-31 DIAGNOSIS — H524 Presbyopia: Secondary | ICD-10-CM | POA: Diagnosis not present

## 2018-10-31 DIAGNOSIS — H43812 Vitreous degeneration, left eye: Secondary | ICD-10-CM | POA: Diagnosis not present

## 2018-10-31 DIAGNOSIS — H5213 Myopia, bilateral: Secondary | ICD-10-CM | POA: Diagnosis not present

## 2018-11-20 ENCOUNTER — Other Ambulatory Visit: Payer: Self-pay

## 2018-11-20 DIAGNOSIS — I10 Essential (primary) hypertension: Secondary | ICD-10-CM

## 2018-11-20 DIAGNOSIS — E785 Hyperlipidemia, unspecified: Secondary | ICD-10-CM

## 2018-11-25 ENCOUNTER — Other Ambulatory Visit: Payer: 59

## 2018-11-25 DIAGNOSIS — E785 Hyperlipidemia, unspecified: Secondary | ICD-10-CM | POA: Diagnosis not present

## 2018-11-25 DIAGNOSIS — I10 Essential (primary) hypertension: Secondary | ICD-10-CM | POA: Diagnosis not present

## 2018-11-25 DIAGNOSIS — F419 Anxiety disorder, unspecified: Secondary | ICD-10-CM | POA: Diagnosis not present

## 2018-11-25 DIAGNOSIS — R69 Illness, unspecified: Secondary | ICD-10-CM | POA: Diagnosis not present

## 2018-11-29 ENCOUNTER — Ambulatory Visit: Payer: 59 | Admitting: Family

## 2018-11-29 ENCOUNTER — Encounter: Payer: Self-pay | Admitting: Family

## 2018-11-29 VITALS — BP 148/88 | HR 87 | Temp 98.6°F | Ht 70.0 in | Wt 189.0 lb

## 2018-11-29 DIAGNOSIS — G44221 Chronic tension-type headache, intractable: Secondary | ICD-10-CM | POA: Diagnosis not present

## 2018-11-29 DIAGNOSIS — R69 Illness, unspecified: Secondary | ICD-10-CM | POA: Diagnosis not present

## 2018-11-29 DIAGNOSIS — F419 Anxiety disorder, unspecified: Secondary | ICD-10-CM

## 2018-11-29 DIAGNOSIS — E785 Hyperlipidemia, unspecified: Secondary | ICD-10-CM

## 2018-11-29 DIAGNOSIS — I10 Essential (primary) hypertension: Secondary | ICD-10-CM

## 2018-11-29 DIAGNOSIS — F902 Attention-deficit hyperactivity disorder, combined type: Secondary | ICD-10-CM | POA: Diagnosis not present

## 2018-11-29 DIAGNOSIS — F32A Depression, unspecified: Secondary | ICD-10-CM

## 2018-11-29 DIAGNOSIS — F329 Major depressive disorder, single episode, unspecified: Secondary | ICD-10-CM

## 2018-11-29 DIAGNOSIS — R739 Hyperglycemia, unspecified: Secondary | ICD-10-CM | POA: Diagnosis not present

## 2018-11-29 MED ORDER — CLONAZEPAM 0.5 MG PO TABS: 0.5000 mg | ORAL_TABLET | Freq: Three times a day (TID) | ORAL | 0 refills | Status: DC | PRN

## 2018-11-29 MED ORDER — AMPHETAMINE-DEXTROAMPHET ER 10 MG PO CP24
ORAL_CAPSULE | ORAL | 0 refills | Status: DC
Start: 2018-11-29 — End: 2018-12-30

## 2018-11-29 MED ORDER — BUTALBITAL-APAP-CAFFEINE 50-325-40 MG PO TABS: ORAL_TABLET | ORAL | 1 refills | Status: DC

## 2018-11-29 NOTE — Patient Instructions (Signed)
1. Please see a counselor at work as discussed for depression.Notify provider or go to ER if depression symptoms worsen.  2. Recommend low carbohydrate,low saturated fats,high vegetable diet and increase exercise.  3. Follow up in 6 months for routine check up or sooner if needed for depression.please get lab work prior to 6 months visit.

## 2018-11-29 NOTE — Progress Notes (Signed)
Provider: Dinah Ngetich FNP-C   Lauree Chandler, NP  Patient Care Team: Lauree Chandler, NP as PCP - General (Geriatric Medicine) Rozetta Nunnery, MD as Consulting Physician (Otolaryngology) Georganna Skeans, MD as Consulting Physician (General Surgery) Rutherford Guys, MD as Consulting Physician (Ophthalmology) Ladene Artist, MD as Consulting Physician (Gastroenterology) Rolm Bookbinder, MD as Consulting Physician (General Surgery) Allyn Kenner, MD (Dermatology) Nehemiah Settle, MD (Gastroenterology)  Extended Emergency Contact Information Primary Emergency Contact: Hardigree,Lynn Address: [address] [place]          [place], [place] [address] Johnnette Litter of [place] Phone: [phone] Mobile Phone: [phone] Relation: Spouse   Goals of care: Advanced Directive information Advanced Directives 11/29/2018  Does Patient Have a Medical Advance Directive? No  Type of Advance Directive -  Does patient want to make changes to medical advance directive? -  Copy of [place] in Chart? -  Would patient like information on creating a medical advance directive? No - Patient declined  Pre-existing out of facility DNR order (yellow form or pink MOST form) -     Chief Complaint  Patient presents with  . Medical Management of Chronic Issues    6 month follow up    HPI:  Pt is a 62 y.o. male seen today at [facility] office  for medical management of chronic diseases.He states feeling more depressed more than usual since losing his mother four months ago.He states has not had time to real grieve.He states the depression worst due to uncertainty if he will still have a job " My company will be sold out".He states woke up this morning and was very anxious " felt like had lots of butterflies in my belly". He denies thought of harming self.He states has counseling services through his work benefits and will try to reach them.He decline starting on antidepressant for now.    ADHD- Well control with Adderall.He states needs medication refilled.    Chronic Headache - Fioricet effective. Denies any nausea,vomiting or dizziness.  Generalized Anxiety- Has worsen due to uncertainty of his job.Takes clonopin 0.5 mg tablet as needed.has had to take it around 3 Am.  Hypertension - b/p stable.denies any dizziness,chest pain or shortness of breath.no meds.   Hyperlipidemia - Recent LDL 98 currently on lovastatin 20 mg tablet daily.Had been following a  low carbo diet and exercises by walking though has not lately.he has gained 11 lbs over the past 6 months.   Hyperglycemia - recent lab work Glucose was 100.discussed low carbo,low saturated fats and high vegetable diets and exercise.      Past Medical History:  Diagnosis Date  . Abdominal pain, left lower quadrant   . Actinic keratosis   . ADD (attention deficit disorder)   . Alcohol abuse, unspecified   . Anxiety   . Anxiety state, unspecified   . Arthritis    shoulders and hips  . Atrial fibrillation ([place])   . Attention deficit disorder without mention of hyperactivity   . Cervicalgia   . Depressive disorder, not elsewhere classified   . Eczema 11/2011   right hand  . Edema   . Encounter for long-term (current) use of other medications   . Headache(784.0)    tension or sinus  . High cholesterol   . Inguinal hernia 11/2011   right  . Inguinal hernia without mention of obstruction or gangrene, unilateral or unspecified, (not specified as recurrent)   . Insomnia, unspecified   . Internal hemorrhoids without mention of  complication   . Irritable bowel syndrome   . Irritable bowel syndrome (IBS)   . Lumbago   . Major depressive disorder, recurrent episode, severe, without mention of psychotic behavior   . Obesity, unspecified   . Other and unspecified hyperlipidemia   . Other atopic dermatitis and related conditions   . Other disorders of vitreous   . Other seborrheic keratosis   . Other specified  visual disturbances   . Pain in joint, pelvic region and thigh   . Pain in joint, site unspecified   . Pathologic fracture of vertebrae   . Poisoning and toxic reactions caused by other specified animals and plants   . Recent retinal detachment, partial, with single defect   . Routine general medical examination at a health care facility   . Sciatica   . Sinus infection 11/2011   started antibiotic 12/18/2011 x 7 days; current cough  . Special screening for malignant neoplasm of prostate   . Tension headache   . Type II or unspecified type diabetes mellitus without mention of complication, not stated as uncontrolled   . Unspecified essential hypertension    Past Surgical History:  Procedure Laterality Date  . APPENDECTOMY  1987  . CATARACT EXTRACTION  2011   right eye  . CATARACT EXTRACTION W/ INTRAOCULAR LENS IMPLANT Left 04/04/2017   Dr. Richardean Sale  . HEMORRHOID SURGERY  04/15/14   thrombosed int. Hemorrhoid. Dr. Georganna Skeans  . HERNIA REPAIR  05/03/2011   left  . INGUINAL HERNIA REPAIR  12/28/2011   Procedure: HERNIA REPAIR INGUINAL ADULT;  Surgeon: Rolm Bookbinder, MD;  Location: [facility];  Service: General;  Laterality: Right;  . NASAL POLYP SURGERY  01/05/2011   DR. NEWMAN   . NASAL SINUS SURGERY  12/2010  . removed anal warts  1976   Dr Allyson Sabal   . RETINAL DETACHMENT REPAIR W/ SCLERAL BUCKLE LE  07/22/2008   right eye; pars plana vitrectomy  . TONSILLECTOMY AND ADENOIDECTOMY  1964  . TYMPANOSTOMY TUBE PLACEMENT  June 2015   Dr. Loletha Grayer. [facility]    Allergies  Allergen Reactions  . Amitriptyline   . Zolpidem Tartrate Other (See Comments)    Severe headache  . Amoxicillin-Pot Clavulanate Rash  . Propoxyphene Hcl Nausea Only    Allergies as of 11/29/2018      Reactions   Amitriptyline    Zolpidem Tartrate Other (See Comments)   Severe headache   Amoxicillin-pot Clavulanate Rash   Propoxyphene Hcl Nausea Only      Medication List        Accurate as of  11/29/18  9:00 AM. Always use your most recent med list.          amphetamine-dextroamphetamine 10 MG 24 hr capsule Commonly known as:  ADDERALL XR Take one tablet once a day for ADD   butalbital-acetaminophen-caffeine 50-325-40 MG tablet Commonly known as:  FIORICET, ESGIC Take one tablet daily as needed for headache   clonazePAM 0.5 MG tablet Commonly known as:  KLONOPIN Take 1 tablet (0.5 mg total) by mouth 3 (three) times daily as needed.   COMBIGAN OP Place 1 drop into the right eye 2 (two) times daily.   dicyclomine 20 MG tablet Commonly known as:  BENTYL take 1 tablet by mouth every 6 hours if needed TO CONTROL PAIN FROM IRRITABLE BOWEL SYNDROME   fluticasone 50 MCG/ACT nasal spray Commonly known as:  FLONASE Place 2 sprays into the nose daily.   halobetasol 0.05 % cream Commonly  known as:  ULTRAVATE apply to rash UP TO 3 TIMES A DAY   lovastatin 20 MG tablet Commonly known as:  MEVACOR TAKE 1 TABLET BY MOUTH AT BEDTIME   multivitamin tablet Take 1 tablet by mouth daily.       Review of Systems  Constitutional: Negative for appetite change, chills, fatigue, fever and unexpected weight change.  HENT: Negative for congestion, hearing loss, rhinorrhea, sinus pressure, sinus pain, sneezing, sore throat and trouble swallowing.        Chronic nasal allergies Flonase effective   Eyes: Positive for visual disturbance. Negative for discharge, redness and itching.       Wears eye glasses.Follows up with Ophthalmologist for right eye retina.   Respiratory: Negative for apnea, cough, shortness of breath and wheezing.   Cardiovascular: Negative for chest pain, palpitations and leg swelling.  Gastrointestinal: Negative for abdominal distention, abdominal pain, constipation, diarrhea, nausea and vomiting.  Endocrine: Negative for cold intolerance, heat intolerance, polydipsia, polyphagia and polyuria.  Genitourinary: Negative for dysuria, flank pain, frequency and urgency.    Musculoskeletal: Negative for arthralgias, back pain and gait problem.  Skin: Negative for color change, pallor, rash and wound.  Neurological: Negative for dizziness, light-headedness and headaches.  Hematological: Does not bruise/bleed easily.  Psychiatric/Behavioral: Negative for agitation, confusion, sleep disturbance and suicidal ideas. The patient is nervous/anxious.     Immunization History  Administered Date(s) Administered  . DTaP 05/31/2006  . Influenza-Unspecified 10/06/2014, 09/28/2015, 09/25/2016, 09/25/2017, 09/25/2018  . Tdap 10/25/2013   Pertinent  Health Maintenance Due  Topic Date Due  . COLONOSCOPY  02/07/2028  . INFLUENZA VACCINE  Completed   Fall Risk  11/29/2018 05/30/2018 11/29/2017 08/13/2017 01/02/2017  Falls in the past year? 0 No No No No  Number falls in past yr: 0 - - - -  Comment - - - - -  Injury with Fall? 0 - - - -    Vitals:   11/29/18 0819  BP: (!) 148/88  Pulse: 87  Temp: 98.6 F (37 C)  TempSrc: Oral  SpO2: 99%  Weight: 189 lb (85.7 kg)  Height: _0  (1.778 m)   Body mass index is 27.12 kg/m. Physical Exam  Constitutional: He is oriented to person, place, and time. He appears well-developed and well-nourished. No distress.  HENT:  Head: Normocephalic.  Right Ear: External ear normal.  Left Ear: External ear normal.  Mouth/Throat: Oropharynx is clear and moist. No oropharyngeal exudate.  Eyes: Pupils are equal, round, and reactive to light. Conjunctivae are normal. Right eye exhibits no discharge. Left eye exhibits no discharge. No scleral icterus.  Corrective lens in place   Neck: Normal range of motion. No JVD present. No thyromegaly present.  Cardiovascular: Normal rate, regular rhythm, normal heart sounds and intact distal pulses. Exam reveals no gallop and no friction rub.  No murmur heard. Pulmonary/Chest: Effort normal and breath sounds normal. No respiratory distress. He has no wheezes. He has no rales.  Abdominal: Soft.  Bowel sounds are normal. He exhibits no distension and no mass. There is no tenderness. There is no rebound and no guarding.  Musculoskeletal: Normal range of motion. He exhibits no edema, tenderness or deformity.  Lymphadenopathy:    He has no cervical adenopathy.  Neurological: He is oriented to person, place, and time.  Skin: Skin is warm and dry. No rash noted. No erythema. No pallor.  Psychiatric: His speech is normal and behavior is normal. Judgment and thought content normal. His mood appears anxious. He  exhibits a depressed mood.  Vitals reviewed.   Labs reviewed: Recent Labs    05/28/18 0814 11/25/18 0808  NA 137 138  K 4.2 4.7  CL 101 102  CO2 28 28  GLUCOSE 93 100*  BUN 15 10  CREATININE 0.88 0.88  CALCIUM 9.4 9.2   Recent Labs    05/28/18 0814 11/25/18 0808  AST 20 23  ALT 23 24  BILITOT 0.6 0.4  PROT 6.7 6.5   Recent Labs    11/29/17 0938 11/25/18 0808  WBC 8.6 5.6  NEUTROABS 5,435 2,570  HGB 13.9 14.0  HCT 41.2 41.5  MCV 93.4 92.6  PLT 203 195   No results found for: TSH Lab Results  Component Value Date   HGBA1C 5.1 05/28/2018   Lab Results  Component Value Date   CHOL 196 11/25/2018   HDL 81 11/25/2018   LDLCALC 98 11/25/2018   TRIG 78 11/25/2018   CHOLHDL 2.4 11/25/2018    Significant Diagnostic Results in last 30 days:  No results found.  Assessment/Plan 1. Attention deficit hyperactivity disorder (ADHD), combined type Symptoms controlled. - amphetamine-dextroamphetamine (ADDERALL XR) 10 MG 24 hr capsule; Take one tablet once a day for ADD  Dispense: 30 capsule; Refill: 0  2. Chronic tension-type headache, intractable Asymptomatic. - butalbital-acetaminophen-caffeine (FIORICET) 50-325-40 MG tablet; Take one tablet daily as needed for headache  Dispense: 30 tablet; Refill: 1  3. Anxiety Has had increased anxiety due to uncertainty of his job. - clonazePAM (KLONOPIN) 0.5 MG tablet; Take 1 tablet (0.5 mg total) by mouth 3 (three)  times daily as needed.  Dispense: 90 tablet; Refill: 0 - TSH add on to recent lab work.  4. Benign essential HTN B/p stable.current no medication. - CBC with Differential/Platelet; Future - CMP with eGFR([facility]); Future  5. Hyperlipidemia, unspecified hyperlipidemia type Recent LDL 98 continue on lovastatin 20 mg tablet daily.Recommend low carbo,low saturated fats diet,high vegetable diet.Also increase physical activity/exercise at least 30 minutes three times a week. - Lipid Panel; Future  6. Hyperglycemia Recent glucose 100 diet and exercise as discussed above. - Hemoglobin A1c; Future  7. Depression, unspecified depression type Had loss of mother 4 months ago and uncertainty of his job.No suicide ideation or harm to others.Recommended starting on an antidepressant but patient declined.Referall to counseling service also recommended but patient states will get counseling through his work place benefits.Encouraged to call provider's office or go to ED if symptoms worsen.Encouraged increase in exercise and sleep hygiene.     - TSH level add on recent lab work.    Family/ staff Communication: Reviewed plan of care with patient. Labs/tests ordered: - TSH level add on recent lab work. CBC/diff,CMP,Hgb A1C and Lipid panel in 6 months prior to 6 months visit.   Sandrea Hughs, NP

## 2018-11-30 LAB — TSH: TSH: 1.49 mIU/L (ref 0.40–4.50)

## 2018-11-30 LAB — CBC WITH DIFFERENTIAL/PLATELET
Basophils Absolute: 39 cells/uL (ref 0–200)
Basophils Relative: 0.7 %
EOS ABS: 336 {cells}/uL (ref 15–500)
Eosinophils Relative: 6 %
HCT: 41.5 % (ref 38.5–50.0)
Hemoglobin: 14 g/dL (ref 13.2–17.1)
Lymphs Abs: 2078 cells/uL (ref 850–3900)
MCH: 31.3 pg (ref 27.0–33.0)
MCHC: 33.7 g/dL (ref 32.0–36.0)
MCV: 92.6 fL (ref 80.0–100.0)
MPV: 10.3 fL (ref 7.5–12.5)
Monocytes Relative: 10.3 %
NEUTROS PCT: 45.9 %
Neutro Abs: 2570 cells/uL (ref 1500–7800)
Platelets: 195 10*3/uL (ref 140–400)
RBC: 4.48 10*6/uL (ref 4.20–5.80)
RDW: 12.2 % (ref 11.0–15.0)
Total Lymphocyte: 37.1 %
WBC: 5.6 10*3/uL (ref 3.8–10.8)
WBCMIX: 577 {cells}/uL (ref 200–950)

## 2018-11-30 LAB — COMPREHENSIVE METABOLIC PANEL
AG RATIO: 2.3 (calc) (ref 1.0–2.5)
ALT: 24 U/L (ref 9–46)
AST: 23 U/L (ref 10–35)
Albumin: 4.5 g/dL (ref 3.6–5.1)
Alkaline phosphatase (APISO): 58 U/L (ref 40–115)
BILIRUBIN TOTAL: 0.4 mg/dL (ref 0.2–1.2)
BUN: 10 mg/dL (ref 7–25)
CALCIUM: 9.2 mg/dL (ref 8.6–10.3)
CO2: 28 mmol/L (ref 20–32)
Chloride: 102 mmol/L (ref 98–110)
Creat: 0.88 mg/dL (ref 0.70–1.25)
Globulin: 2 g/dL (calc) (ref 1.9–3.7)
Glucose, Bld: 100 mg/dL — ABNORMAL HIGH (ref 65–99)
Potassium: 4.7 mmol/L (ref 3.5–5.3)
Sodium: 138 mmol/L (ref 135–146)
TOTAL PROTEIN: 6.5 g/dL (ref 6.1–8.1)

## 2018-11-30 LAB — TEST AUTHORIZATION

## 2018-11-30 LAB — LIPID PANEL
CHOLESTEROL: 196 mg/dL (ref <200)
HDL: 81 mg/dL (ref >40)
LDL CHOLESTEROL (CALC): 98 mg/dL
Non-HDL Cholesterol (Calc): 115 mg/dL (calc) (ref <130)
TRIGLYCERIDES: 78 mg/dL (ref <150)
Total CHOL/HDL Ratio: 2.4 (calc) (ref <5.0)

## 2018-12-19 ENCOUNTER — Other Ambulatory Visit: Payer: Self-pay | Admitting: Nurse Practitioner

## 2018-12-19 ENCOUNTER — Telehealth: Payer: Self-pay

## 2018-12-19 NOTE — Telephone Encounter (Signed)
[facility] Cipro 500mg  po bid and Flagyl 500mg  q8h po until the appointment next Tuesday is reasonable. Will recommended ED evaluation if his symptoms worsen or new symptoms develop.

## 2018-12-19 NOTE — Telephone Encounter (Signed)
Please making an appointment to be seen ASAP before sending the prescriptions in. The patient needs to be examined and treated if his symptoms persist.

## 2018-12-19 NOTE — Telephone Encounter (Signed)
[facility] and [facility] for [facility] days only (rx defaulted to a 7 day supply, yet I changed to reflect providers instructions)   Spoke with patient, patient aware rx's will be sent in. Patient refused to schedule for next week stating he will call if he is no better to schedule

## 2018-12-19 NOTE — Telephone Encounter (Signed)
Patient called requesting appointment for Diverticulitis flare up. Patient c/o left lower abdominal pain that onset last night. Pain is currently a 2/10 yet progressively getting worse.  Patient informed we do not have any available appointments until Tuesday 12/24/18. Patient questions if a rx for Cipro and Flagyl can be sent in as treatment (per patient, previous treatment)  Patient aware I will send a message to covering provider  [facility]

## 2018-12-20 ENCOUNTER — Encounter: Payer: Self-pay | Admitting: Adult Health

## 2018-12-20 ENCOUNTER — Ambulatory Visit (INDEPENDENT_AMBULATORY_CARE_PROVIDER_SITE_OTHER): Payer: 59 | Admitting: Adult Health

## 2018-12-20 VITALS — BP 148/86 | HR 94 | Temp 98.7°F | Ht 70.0 in | Wt 193.0 lb

## 2018-12-20 DIAGNOSIS — K5730 Diverticulosis of large intestine without perforation or abscess without bleeding: Secondary | ICD-10-CM

## 2018-12-20 MED ORDER — METRONIDAZOLE 500 MG PO TABS: 500.0000 mg | ORAL_TABLET | Freq: Three times a day (TID) | ORAL | 0 refills | Status: DC

## 2018-12-20 MED ORDER — CIPROFLOXACIN HCL 500 MG PO TABS: 500.0000 mg | ORAL_TABLET | Freq: Two times a day (BID) | ORAL | 0 refills | Status: DC

## 2018-12-20 NOTE — Patient Instructions (Signed)
Will start on Cipro 500 mg BID X 10 days and Flagyl 500 mg TID X 7 days.  If pain persists then will need to call the office and schedule a visit.

## 2018-12-20 NOTE — Telephone Encounter (Signed)
Patient will come in and be seen today by Avanell ShackletonMonina, NP

## 2018-12-20 NOTE — Progress Notes (Signed)
[facility]  Provider: Monina C. Medina-Vargas - NP  Code Status: Full Code  Goals of Care:  Advanced Directives 11/29/2018  Does Patient Have a Medical Advance Directive? No  Type of Advance Directive -  Does patient want to make changes to medical advance directive? -  Copy of Healthcare Power of Attorney in Chart? -  Would patient like information on creating a medical advance directive? No - Patient declined  Pre-existing out of facility DNR order (yellow form or pink MOST form) -     Chief Complaint  Patient presents with  . Acute Visit    Patient c/o left lower abdominal pain, possible diverticulitis flare up     HPI: Patient is a 62 y.o. male seen today for an acute visit for diverticulitis flare up. He was having LLQ pain and frequent trips to bathroom. He denies having diarrhea. He had colonoscopy las February 2019 and he said that it was normal, no polys.   Allergic Rhinitis - stable, currently takes Flonase daily  IBS - denies having constipation nor diarrhea, takes Bentyl PRN  Anxiety - his workplace is having a transition and does not know if he will be able to stay working, currently on Klonopin PRN   Past Medical History:  Diagnosis Date  . Abdominal pain, left lower quadrant   . Actinic keratosis   . ADD (attention deficit disorder)   . Alcohol abuse, unspecified   . Anxiety   . Anxiety state, unspecified   . Arthritis    shoulders and hips  . Atrial fibrillation (HCC)   . Attention deficit disorder without mention of hyperactivity   . Cervicalgia   . Depressive disorder, not elsewhere classified   . Eczema 11/2011   right hand  . Edema   . Encounter for long-term (current) use of other medications   . Headache(784.0)    tension or sinus  . High cholesterol   . Inguinal hernia 11/2011   right  . Inguinal hernia without mention of obstruction or gangrene, unilateral or unspecified, (not specified as recurrent)   . Insomnia, unspecified   .  Internal hemorrhoids without mention of complication   . Irritable bowel syndrome   . Irritable bowel syndrome (IBS)   . Lumbago   . Major depressive disorder, recurrent episode, severe, without mention of psychotic behavior   . Obesity, unspecified   . Other and unspecified hyperlipidemia   . Other atopic dermatitis and related conditions   . Other disorders of vitreous   . Other seborrheic keratosis   . Other specified visual disturbances   . Pain in joint, pelvic region and thigh   . Pain in joint, site unspecified   . Pathologic fracture of vertebrae   . Poisoning and toxic reactions caused by other specified animals and plants   . Recent retinal detachment, partial, with single defect   . Routine general medical examination at a health care facility   . Sciatica   . Sinus infection 11/2011   started antibiotic 12/18/2011 x 7 days; current cough  . Special screening for malignant neoplasm of prostate   . Tension headache   . Type II or unspecified type diabetes mellitus without mention of complication, not stated as uncontrolled   . Unspecified essential hypertension     Past Surgical History:  Procedure Laterality Date  . APPENDECTOMY  1987  . CATARACT EXTRACTION  2011   right eye  . CATARACT EXTRACTION W/ INTRAOCULAR LENS IMPLANT Left 04/04/2017   Dr.  Shaprio  . EYE SURGERY Right 09/13/2018   Dr.Hanes with [facility]   . HEMORRHOID SURGERY  04/15/14   thrombosed int. Hemorrhoid. Dr. Violeta GelinasBurke Thompson  . HERNIA REPAIR  05/03/2011   left  . INGUINAL HERNIA REPAIR  12/28/2011   Procedure: HERNIA REPAIR INGUINAL ADULT;  Surgeon: Emelia LoronMatthew Wakefield, MD;  Location: [facility];  Service: General;  Laterality: Right;  . NASAL POLYP SURGERY  01/05/2011   DR. NEWMAN   . NASAL SINUS SURGERY  12/2010  . removed anal warts  1976   Dr Terri PiedraLupton   . RETINAL DETACHMENT REPAIR W/ SCLERAL BUCKLE LE  07/22/2008   right eye; pars plana vitrectomy  . TONSILLECTOMY AND  ADENOIDECTOMY  1964  . TYMPANOSTOMY TUBE PLACEMENT  June 2015   Dr. Salena Saner. Newman    Allergies  Allergen Reactions  . Amitriptyline   . Zolpidem Tartrate Other (See Comments)    Severe headache  . Amoxicillin-Pot Clavulanate Rash  . Propoxyphene Hcl Nausea Only    Outpatient Encounter Medications as of 12/20/2018  Medication Sig  . amphetamine-dextroamphetamine (ADDERALL XR) 10 MG 24 hr capsule Take one tablet once a day for ADD  . Brimonidine Tartrate-Timolol (COMBIGAN OP) Place 1 drop into the right eye 2 (two) times daily.  . butalbital-acetaminophen-caffeine (FIORICET) 50-325-40 MG tablet Take one tablet daily as needed for headache  . clonazePAM (KLONOPIN) 0.5 MG tablet Take 1 tablet (0.5 mg total) by mouth 3 (three) times daily as needed.  . dicyclomine (BENTYL) 20 MG tablet TAKE 1 TABLET BY MOUTH EVERY 6 HOURS AS NEEDED TO CONTROL PAIN FROM IRRITABLE BOWEL SYNDROME  . fluticasone (FLONASE) 50 MCG/ACT nasal spray Place 2 sprays into the nose daily.   . halobetasol (ULTRAVATE) 0.05 % cream apply to rash UP TO 3 TIMES A DAY  . lovastatin (MEVACOR) 20 MG tablet TAKE 1 TABLET BY MOUTH AT BEDTIME  . Multiple Vitamin (MULTIVITAMIN) tablet Take 1 tablet by mouth daily.     No facility-administered encounter medications on file as of 12/20/2018.     Review of Systems:  Review of Systems  Constitutional: Negative.  Negative for chills.  HENT: Negative.  Negative for congestion.   Cardiovascular: Negative.   Gastrointestinal: Positive for abdominal pain. Negative for diarrhea.  Genitourinary: Negative.   Musculoskeletal: Negative.   Skin: Negative.     Health Maintenance  Topic Date Due  . HIV Screening  07/06/1971  . TETANUS/TDAP  10/26/2023  . COLONOSCOPY  02/07/2028  . INFLUENZA VACCINE  Completed  . Hepatitis C Screening  Completed    Physical Exam: Vitals:   12/20/18 1357  BP: (!) 148/86  Pulse: 94  Temp: 98.7 F (37.1 C)  TempSrc: Oral  SpO2: 98%  Weight: 193  lb (87.5 kg)  Height: 5\' 10"  (1.778 m)   Body mass index is 27.69 kg/m. Physical Exam Constitutional:      Appearance: Normal appearance. He is normal weight.  HENT:     Head: Normocephalic.  Neck:     Musculoskeletal: Normal range of motion.  Cardiovascular:     Rate and Rhythm: Normal rate and regular rhythm.     Pulses: Normal pulses.     Heart sounds: Normal heart sounds.  Pulmonary:     Effort: Pulmonary effort is normal.     Breath sounds: Normal breath sounds.  Abdominal:     General: Bowel sounds are normal.     Palpations: There is no mass.     Tenderness: There is  abdominal tenderness.  Musculoskeletal: Normal range of motion.  Skin:    General: Skin is warm and dry.  Neurological:     Mental Status: He is alert.  Psychiatric:        Mood and Affect: Mood normal.        Behavior: Behavior normal.        Thought Content: Thought content normal.     Labs reviewed: Basic Metabolic Panel: Recent Labs    05/28/18 0814 11/25/18 0808  NA 137 138  K 4.2 4.7  CL 101 102  CO2 28 28  GLUCOSE 93 100*  BUN 15 10  CREATININE 0.88 0.88  CALCIUM 9.4 9.2  TSH  --  1.49   Liver Function Tests: Recent Labs    05/28/18 0814 11/25/18 0808  AST 20 23  ALT 23 24  BILITOT 0.6 0.4  PROT 6.7 6.5   CBC: Recent Labs    11/25/18 0808  WBC 5.6  NEUTROABS 2,570  HGB 14.0  HCT 41.5  MCV 92.6  PLT 195   Lipid Panel: Recent Labs    11/25/18 0808  CHOL 196  HDL 81  LDLCALC 98  TRIG 78  CHOLHDL 2.4   Lab Results  Component Value Date   HGBA1C 5.1 05/28/2018     Assessment/Plan  1. Diverticulosis of colon without hemorrhage  Will start on Cipro 500 mg BID X 10 days and Flagyl 500 mg TID X 7 days.  If pain persists then will need to call the office and schedule a visit.   Next appt:  05/26/2019

## 2018-12-30 ENCOUNTER — Other Ambulatory Visit: Payer: Self-pay | Admitting: *Deleted

## 2018-12-30 DIAGNOSIS — F902 Attention-deficit hyperactivity disorder, combined type: Secondary | ICD-10-CM

## 2018-12-30 MED ORDER — AMPHETAMINE-DEXTROAMPHET ER 10 MG PO CP24: ORAL_CAPSULE | ORAL | 0 refills | Status: DC

## 2018-12-30 NOTE — Telephone Encounter (Signed)
Patient requested refill [place] Verified LR: 11/29/2018 Pended Rx and sent to [facility] for approval  

## 2019-01-20 ENCOUNTER — Other Ambulatory Visit: Payer: Self-pay | Admitting: Nurse Practitioner

## 2019-01-20 DIAGNOSIS — L309 Dermatitis, unspecified: Secondary | ICD-10-CM

## 2019-01-20 NOTE — Telephone Encounter (Signed)
Spoke with patient, patient requested medication and uses as needed for Eczema. Patient will lose insurance in February and would like to get rx before insurance runs out.  Last filled 2018, please advise if ok to fill

## 2019-01-23 DIAGNOSIS — H43812 Vitreous degeneration, left eye: Secondary | ICD-10-CM | POA: Diagnosis not present

## 2019-01-23 DIAGNOSIS — H35371 Puckering of macula, right eye: Secondary | ICD-10-CM | POA: Diagnosis not present

## 2019-01-23 DIAGNOSIS — H31091 Other chorioretinal scars, right eye: Secondary | ICD-10-CM | POA: Diagnosis not present

## 2019-01-29 ENCOUNTER — Other Ambulatory Visit: Payer: Self-pay | Admitting: *Deleted

## 2019-01-29 DIAGNOSIS — F902 Attention-deficit hyperactivity disorder, combined type: Secondary | ICD-10-CM

## 2019-01-29 MED ORDER — AMPHETAMINE-DEXTROAMPHET ER 10 MG PO CP24: ORAL_CAPSULE | ORAL | 0 refills | Status: DC

## 2019-01-29 NOTE — Telephone Encounter (Signed)
Patient requested refill [place] Verified LR: 12/30/18 Pended Rx and sent to [facility] for approval.  

## 2019-02-17 ENCOUNTER — Other Ambulatory Visit: Payer: Self-pay | Admitting: Family

## 2019-02-17 DIAGNOSIS — F419 Anxiety disorder, unspecified: Secondary | ICD-10-CM

## 2019-02-17 NOTE — Telephone Encounter (Signed)
[place] Database verified and compliance confirmed   Last filled 11/29/2018

## 2019-02-18 ENCOUNTER — Encounter: Payer: Self-pay | Admitting: Nurse Practitioner

## 2019-02-18 DIAGNOSIS — K5732 Diverticulitis of large intestine without perforation or abscess without bleeding: Secondary | ICD-10-CM | POA: Diagnosis not present

## 2019-02-20 DIAGNOSIS — H40041 Steroid responder, right eye: Secondary | ICD-10-CM | POA: Diagnosis not present

## 2019-02-28 ENCOUNTER — Other Ambulatory Visit: Payer: Self-pay | Admitting: *Deleted

## 2019-02-28 DIAGNOSIS — F902 Attention-deficit hyperactivity disorder, combined type: Secondary | ICD-10-CM

## 2019-02-28 MED ORDER — AMPHETAMINE-DEXTROAMPHET ER 10 MG PO CP24: ORAL_CAPSULE | ORAL | 0 refills | Status: DC

## 2019-02-28 NOTE — Telephone Encounter (Signed)
Patient requested Rx [place] Database Verified LR: 01/29/19 # 30 Pended Rx and sent to [facility] for approval.

## 2019-03-27 ENCOUNTER — Other Ambulatory Visit: Payer: Self-pay | Admitting: Nurse Practitioner

## 2019-03-31 ENCOUNTER — Other Ambulatory Visit: Payer: Self-pay | Admitting: *Deleted

## 2019-03-31 DIAGNOSIS — F902 Attention-deficit hyperactivity disorder, combined type: Secondary | ICD-10-CM

## 2019-03-31 MED ORDER — AMPHETAMINE-DEXTROAMPHET ER 10 MG PO CP24: ORAL_CAPSULE | ORAL | 0 refills | Status: DC

## 2019-03-31 NOTE — Telephone Encounter (Signed)
Patient requested refill [place] Verified LR: 02/28/2019 Pended Rx and sent to [facility] for approval.  

## 2019-04-10 ENCOUNTER — Encounter: Payer: Self-pay | Admitting: Nurse Practitioner

## 2019-04-10 ENCOUNTER — Other Ambulatory Visit: Payer: Self-pay | Admitting: Nurse Practitioner

## 2019-04-10 DIAGNOSIS — Z8042 Family history of malignant neoplasm of prostate: Secondary | ICD-10-CM

## 2019-04-30 ENCOUNTER — Other Ambulatory Visit: Payer: Self-pay | Admitting: *Deleted

## 2019-04-30 DIAGNOSIS — F902 Attention-deficit hyperactivity disorder, combined type: Secondary | ICD-10-CM

## 2019-04-30 MED ORDER — AMPHETAMINE-DEXTROAMPHET ER 10 MG PO CP24: ORAL_CAPSULE | ORAL | 0 refills | Status: DC

## 2019-04-30 NOTE — Telephone Encounter (Signed)
Patient requested Rx [place] Database Verified LR: 03/31/2019  Pended Rx and sent to [facility] for Approval Shanda Bumps out of office)

## 2019-05-06 ENCOUNTER — Other Ambulatory Visit: Payer: Self-pay | Admitting: Adult Health

## 2019-05-06 DIAGNOSIS — F902 Attention-deficit hyperactivity disorder, combined type: Secondary | ICD-10-CM

## 2019-05-06 MED ORDER — AMPHETAMINE-DEXTROAMPHET ER 10 MG PO CP24: ORAL_CAPSULE | ORAL | 0 refills | Status: DC

## 2019-05-23 ENCOUNTER — Other Ambulatory Visit: Payer: Self-pay

## 2019-05-23 DIAGNOSIS — Z8042 Family history of malignant neoplasm of prostate: Secondary | ICD-10-CM

## 2019-05-26 ENCOUNTER — Other Ambulatory Visit: Payer: Self-pay

## 2019-05-26 ENCOUNTER — Other Ambulatory Visit: Payer: 59

## 2019-05-27 LAB — PSA: PSA: 3.7 ng/mL (ref <=4.0)

## 2019-05-30 ENCOUNTER — Encounter: Payer: Self-pay | Admitting: Nurse Practitioner

## 2019-05-30 ENCOUNTER — Other Ambulatory Visit: Payer: Self-pay

## 2019-05-30 ENCOUNTER — Ambulatory Visit (INDEPENDENT_AMBULATORY_CARE_PROVIDER_SITE_OTHER): Payer: 59 | Admitting: Nurse Practitioner

## 2019-05-30 ENCOUNTER — Ambulatory Visit: Payer: 59 | Admitting: Nurse Practitioner

## 2019-05-30 VITALS — BP 148/80 | HR 73 | Temp 98.6°F | Ht 70.0 in | Wt 192.2 lb

## 2019-05-30 DIAGNOSIS — K5730 Diverticulosis of large intestine without perforation or abscess without bleeding: Secondary | ICD-10-CM

## 2019-05-30 DIAGNOSIS — F902 Attention-deficit hyperactivity disorder, combined type: Secondary | ICD-10-CM

## 2019-05-30 DIAGNOSIS — M720 Palmar fascial fibromatosis [Dupuytren]: Secondary | ICD-10-CM

## 2019-05-30 DIAGNOSIS — K580 Irritable bowel syndrome with diarrhea: Secondary | ICD-10-CM

## 2019-05-30 DIAGNOSIS — Z125 Encounter for screening for malignant neoplasm of prostate: Secondary | ICD-10-CM

## 2019-05-30 DIAGNOSIS — I10 Essential (primary) hypertension: Secondary | ICD-10-CM

## 2019-05-30 DIAGNOSIS — B372 Candidiasis of skin and nail: Secondary | ICD-10-CM | POA: Diagnosis not present

## 2019-05-30 DIAGNOSIS — R739 Hyperglycemia, unspecified: Secondary | ICD-10-CM

## 2019-05-30 DIAGNOSIS — G44221 Chronic tension-type headache, intractable: Secondary | ICD-10-CM

## 2019-05-30 DIAGNOSIS — F4322 Adjustment disorder with anxiety: Secondary | ICD-10-CM

## 2019-05-30 MED ORDER — NYSTATIN 100000 UNIT/GM EX CREA: 1.0000 "application " | TOPICAL_CREAM | Freq: Two times a day (BID) | CUTANEOUS | 0 refills | Status: DC

## 2019-05-30 MED ORDER — AMPHETAMINE-DEXTROAMPHET ER 10 MG PO CP24: ORAL_CAPSULE | ORAL | 0 refills | Status: DC

## 2019-05-30 MED ORDER — FLUCONAZOLE 100 MG PO TABS: 100.0000 mg | ORAL_TABLET | Freq: Every day | ORAL | 0 refills | Status: DC

## 2019-05-30 NOTE — Patient Instructions (Signed)
To use diflucan daily for 5 days  To use cream twice daily until resolved Call office if rash continues or worsens  Heart-Healthy Eating Plan Many factors influence your heart (coronary) health, including eating and exercise habits. Coronary risk increases with abnormal blood fat (lipid) levels. Heart-healthy meal planning includes limiting unhealthy fats, increasing healthy fats, and making other diet and lifestyle changes. . What are tips for following this plan? Cooking Cook foods using methods other than frying. Baking, boiling, grilling, and broiling are all good options. Other ways to reduce fat include:  Removing the skin from poultry.  Removing all visible fats from meats.  Steaming vegetables in water or broth. Meal planning   At meals, imagine dividing your plate into fourths: ? Fill one-half of your plate with vegetables and green salads. ? Fill one-fourth of your plate with whole grains. ? Fill one-fourth of your plate with lean protein foods.  Eat 4-5 servings of vegetables per day. One serving equals 1 cup raw or cooked vegetable, or 2 cups raw leafy greens.  Eat 4-5 servings of fruit per day. One serving equals 1 medium whole fruit,  cup dried fruit,  cup fresh, frozen, or canned fruit, or  cup 100% fruit juice.  Eat more foods that contain soluble fiber. Examples include apples, broccoli, carrots, beans, peas, and barley. Aim to get 25-30 g of fiber per day.  Increase your consumption of legumes, nuts, and seeds to 4-5 servings per week. One serving of dried beans or legumes equals  cup cooked, 1 serving of nuts is  cup, and 1 serving of seeds equals 1 tablespoon. Fats  Choose healthy fats more often. Choose monounsaturated and polyunsaturated fats, such as olive and canola oils, flaxseeds, walnuts, almonds, and seeds.  Eat more omega-3 fats. Choose salmon, mackerel, sardines, tuna, flaxseed oil, and ground flaxseeds. Aim to eat fish at least 2 times each  week.  Check food labels carefully to identify foods with trans fats or high amounts of saturated fat.  Limit saturated fats. These are found in animal products, such as meats, butter, and cream. Plant sources of saturated fats include palm oil, palm kernel oil, and coconut oil.  Avoid foods with partially hydrogenated oils in them. These contain trans fats. Examples are stick margarine, some tub margarines, cookies, crackers, and other baked goods.  Avoid fried foods. General information  Eat more home-cooked food and less restaurant, buffet, and fast food.  Limit or avoid alcohol.  Limit foods that are high in starch and sugar.  Lose weight if you are overweight. Losing just 5-10% of your body weight can help your overall health and prevent diseases such as diabetes and heart disease.  Monitor your salt (sodium) intake, especially if you have high blood pressure. Talk with your health care provider about your sodium intake.  Try to incorporate more vegetarian meals weekly. What foods can I eat? Fruits All fresh, canned (in natural juice), or frozen fruits. Vegetables Fresh or frozen vegetables (raw, steamed, roasted, or grilled). Green salads. Grains Most grains. Choose whole wheat and whole grains most of the time. Rice and pasta, including brown rice and pastas made with whole wheat. Meats and other proteins Lean, well-trimmed beef, veal, pork, and lamb. Chicken and [place] without skin. All fish and shellfish. Wild duck, rabbit, pheasant, and venison. Egg whites or low-cholesterol egg substitutes. Dried beans, peas, lentils, and tofu. Seeds and most nuts. Dairy Low-fat or nonfat cheeses, including ricotta and mozzarella. Skim or 1% milk (liquid,  powdered, or evaporated). Buttermilk made with low-fat milk. Nonfat or low-fat yogurt. Fats and oils Non-hydrogenated (trans-free) margarines. Vegetable oils, including soybean, sesame, sunflower, olive, peanut, safflower, corn, canola,  and cottonseed. Salad dressings or mayonnaise made with a vegetable oil. Beverages Water (mineral or sparkling). Coffee and tea. Diet carbonated beverages. Sweets and desserts Sherbet, gelatin, and fruit ice. Small amounts of dark chocolate. Limit all sweets and desserts. Seasonings and condiments All seasonings and condiments. The items listed above may not be a complete list of foods and beverages you can eat. Contact a dietitian for more options. What foods are not recommended? Fruits Canned fruit in heavy syrup. Fruit in cream or butter sauce. Fried fruit. Limit coconut. Vegetables Vegetables cooked in cheese, cream, or butter sauce. Fried vegetables. Grains Breads made with saturated or trans fats, oils, or whole milk. Croissants. Sweet rolls. Donuts. High-fat crackers, such as cheese crackers. Meats and other proteins Fatty meats, such as hot dogs, ribs, sausage, bacon, rib-eye roast or steak. High-fat deli meats, such as salami and bologna. Caviar. Domestic duck and goose. Organ meats, such as liver. Dairy Cream, sour cream, cream cheese, and creamed cottage cheese. Whole milk cheeses. Whole or 2% milk (liquid, evaporated, or condensed). Whole buttermilk. Cream sauce or high-fat cheese sauce. Whole-milk yogurt. Fats and oils Meat fat, or shortening. Cocoa butter, hydrogenated oils, palm oil, coconut oil, palm kernel oil. Solid fats and shortenings, including bacon fat, salt pork, lard, and butter. Nondairy cream substitutes. Salad dressings with cheese or sour cream. Beverages Regular sodas and any drinks with added sugar. Sweets and desserts Frosting. Pudding. Cookies. Cakes. Pies. Milk chocolate or white chocolate. Buttered syrups. Full-fat ice cream or ice cream drinks. The items listed above may not be a complete list of foods and beverages to avoid. Contact a dietitian for more information. Summary  Heart-healthy meal planning includes limiting unhealthy fats, increasing  healthy fats, and making other diet and lifestyle changes.  Lose weight if you are overweight. Losing just 5-10% of your body weight can help your overall health and prevent diseases such as diabetes and heart disease.  Focus on eating a balance of foods, including fruits and vegetables, low-fat or nonfat dairy, lean protein, nuts and legumes, whole grains, and heart-healthy oils and fats. This information is not intended to replace advice given to you by your health care provider. Make sure you discuss any questions you have with your health care provider. Document Released: 09/19/2008 Document Revised: 01/18/2018 Document Reviewed: 01/18/2018 [employer]vier Interactive Patient Education  2019 [employer]

## 2019-05-30 NOTE — Progress Notes (Signed)
Careteam: Patient Care Team: Sharon Seller, NP as PCP - General (Geriatric Medicine) Drema Halon, MD as Consulting Physician (Otolaryngology) Violeta Gelinas, MD as Consulting Physician (General Surgery) Jethro Bolus, MD as Consulting Physician (Ophthalmology) Meryl Dare, MD as Consulting Physician (Gastroenterology) Emelia Loron, MD as Consulting Physician (General Surgery) Nita Sells, MD (Dermatology) Webb Silversmith, MD (Gastroenterology)  Advanced Directive information Does Patient Have a Medical Advance Directive?: Yes, Type of Advance Directive: Living will;Healthcare Power of Attorney, Does patient want to make changes to medical advance directive?: No - Patient declined  Allergies  Allergen Reactions  . Amitriptyline   . Zolpidem Tartrate Other (See Comments)    Severe headache  . Amoxicillin-Pot Clavulanate Rash  . Propoxyphene Hcl Nausea Only    Chief Complaint  Patient presents with  . Medical Management of Chronic Issues    6 month follow-up  . Rash    Red, painful rash in private area x several months. Tried OTC cream   . Hand Problem    Little cyst on hands      HPI: Patient is a 63 y.o. male seen in the office today for routine follow up.  IBS/ diverticulosis lower abdomen has been bothering him this morning. Cramping to right lower quadrant. Reports he takes dicyclomine and generally will resolve after a few doses. No constipation or diarrhea, normal BM this morning. No nausea or fever.  ADHD- Well control with Adderall.  Chronic Headache - Fioricet effective- needing 1-2 times a month, haven't needed in a few month. . Denies any nausea,vomiting or dizziness.  Generalized Anxiety- ongoing. Generally will take half clonopin at night as needed, a lot less stress since he is not currently working for previous job. Has an interview coming up.  Walking 2 miles a day.  Hypertension - b/p stable.denies any dizziness,chest pain  or shortness of breath. no meds.   Hyperlipidemia - Recent LDL 98 currently on lovastatin 20 mg tablet daily.Had been following a  low carbo diet and exercises by walking though has not lately.he has gained 11 lbs over the past 6 months.   Hyperglycemia - recent lab work Glucose was 100.discussed low carbo,low saturated fats and high vegetable diets and exercise.    ADHD- Well control with Adderall.He states needs medication refilled.    Chronic Headache - Fioricet effective. Denies any nausea,vomiting or dizziness.  Generalized Anxiety- Has worsen due to uncertainty of his job.Takes clonopin 0.5 mg tablet as needed.has had to take it around 3 Am.  Hypertension - b/p stable.denies any dizziness,chest pain or shortness of breath.no meds. Elevated today, has not taken at home.  Hyperlipidemia - last LDL 98 currently on lovastatin 20 mg tablet daily. Has not made dietary modifications.   Hyperglycemia - not following diet modifications.   Rash in groin, ongoing for over 6 months, now bight red, painful and itching.    Review of Systems:  Review of Systems  Constitutional: Negative for chills, fever and malaise/fatigue.  HENT: Negative for congestion, ear pain, hearing loss, sinus pain and sore throat.   Respiratory: Negative for cough, sputum production, shortness of breath and wheezing.   Cardiovascular: Negative for chest pain.  Gastrointestinal: Positive for abdominal pain (cramping, started today on and off without constipation or diarrhea). Negative for diarrhea and vomiting.  Genitourinary: Negative for dysuria, frequency and urgency.  Musculoskeletal: Negative for joint pain and myalgias.  Neurological: Positive for headaches (occasional). Negative for dizziness.  Psychiatric/Behavioral: Negative for depression. The patient is not  nervous/anxious.     Past Medical History:  Diagnosis Date  . Abdominal pain, left lower quadrant   . Actinic keratosis   . ADD  (attention deficit disorder)   . Alcohol abuse, unspecified   . Anxiety   . Anxiety state, unspecified   . Arthritis    shoulders and hips  . Atrial fibrillation (HCC)   . Attention deficit disorder without mention of hyperactivity   . Cervicalgia   . Depressive disorder, not elsewhere classified   . Eczema 11/2011   right hand  . Edema   . Encounter for long-term (current) use of other medications   . Headache(784.0)    tension or sinus  . High cholesterol   . Inguinal hernia 11/2011   right  . Inguinal hernia without mention of obstruction or gangrene, unilateral or unspecified, (not specified as recurrent)   . Insomnia, unspecified   . Internal hemorrhoids without mention of complication   . Irritable bowel syndrome   . Irritable bowel syndrome (IBS)   . Lumbago   . Major depressive disorder, recurrent episode, severe, without mention of psychotic behavior   . Obesity, unspecified   . Other and unspecified hyperlipidemia   . Other atopic dermatitis and related conditions   . Other disorders of vitreous   . Other seborrheic keratosis   . Other specified visual disturbances   . Pain in joint, pelvic region and thigh   . Pain in joint, site unspecified   . Pathologic fracture of vertebrae   . Poisoning and toxic reactions caused by other specified animals and plants   . Recent retinal detachment, partial, with single defect   . Routine general medical examination at a health care facility   . Sciatica   . Sinus infection 11/2011   started antibiotic 12/18/2011 x 7 days; current cough  . Special screening for malignant neoplasm of prostate   . Tension headache   . Type II or unspecified type diabetes mellitus without mention of complication, not stated as uncontrolled   . Unspecified essential hypertension    Past Surgical History:  Procedure Laterality Date  . APPENDECTOMY  1987  . CATARACT EXTRACTION  2011   right eye  . CATARACT EXTRACTION W/ INTRAOCULAR LENS  IMPLANT Left 04/04/2017   Dr. Dawna Part  . EYE SURGERY Right 09/13/2018   Dr.Hanes with [facility]   . HEMORRHOID SURGERY  04/15/14   thrombosed int. Hemorrhoid. Dr. Violeta Gelinas  . HERNIA REPAIR  05/03/2011   left  . INGUINAL HERNIA REPAIR  12/28/2011   Procedure: HERNIA REPAIR INGUINAL ADULT;  Surgeon: Emelia Loron, MD;  Location: [facility];  Service: General;  Laterality: Right;  . NASAL POLYP SURGERY  01/05/2011   DR. NEWMAN   . NASAL SINUS SURGERY  12/2010  . removed anal warts  1976   Dr Terri Piedra   . RETINAL DETACHMENT REPAIR W/ SCLERAL BUCKLE LE  07/22/2008   right eye; pars plana vitrectomy  . TONSILLECTOMY AND ADENOIDECTOMY  1964  . TYMPANOSTOMY TUBE PLACEMENT  June 2015   Dr. Salena Saner. [place]   Social History:   reports that he quit smoking about 22 years ago. His smoking use included cigarettes. He has never used smokeless tobacco. He reports current alcohol use. He reports that he does not use drugs.  Family History  Problem Relation Age of Onset  . Stroke Father   . Cancer Brother        prostate  . Cancer Paternal Grandfather  colon    Medications: Patient's Medications  New Prescriptions   No medications on file  Previous Medications   AMPHETAMINE-DEXTROAMPHETAMINE (ADDERALL XR) 10 MG 24 HR CAPSULE    Take one tablet once a day for ADD   BUTALBITAL-ACETAMINOPHEN-CAFFEINE (FIORICET) 50-325-40 MG TABLET    Take one tablet daily as needed for headache   CETIRIZINE (ZYRTEC) 10 MG TABLET    1 by mouth 1-3 times weekly   CLONAZEPAM (KLONOPIN) 0.5 MG TABLET    TAKE 1 TABLET(0.5 MG) BY MOUTH THREE TIMES DAILY AS NEEDED   DICYCLOMINE (BENTYL) 20 MG TABLET    TAKE 1 TABLET BY MOUTH EVERY 6 HOURS AS NEEDED TO CONTROL PAIN FROM IRRITABLE BOWEL SYNDROME   DORZOLAMIDEL-TIMOLOL (COSOPT) 22.3-6.8 MG/ML SOLN OPHTHALMIC SOLUTION    Place 1 drop into the right eye daily.   FLUTICASONE (FLONASE) 50 MCG/ACT NASAL SPRAY    Place 2 sprays into the nose daily.     HALOBETASOL (ULTRAVATE) 0.05 % CREAM    APPLY TO RASH UP TO THREE TIMES A DAY.   LOVASTATIN (MEVACOR) 20 MG TABLET    TAKE 1 TABLET BY MOUTH AT BEDTIME   MULTIPLE VITAMIN (MULTIVITAMIN) TABLET    Take 1 tablet by mouth daily.    Modified Medications   No medications on file  Discontinued Medications   BRIMONIDINE TARTRATE-TIMOLOL (COMBIGAN OP)    Place 1 drop into the right eye 2 (two) times daily.   CIPROFLOXACIN (CIPRO) 500 MG TABLET    Take 1 tablet (500 mg total) by mouth 2 (two) times daily.   METRONIDAZOLE (FLAGYL) 500 MG TABLET    Take 1 tablet (500 mg total) by mouth 3 (three) times daily.    Physical Exam:  Vitals:   05/30/19 0847  BP: (!) 148/80  Pulse: 73  Temp: 98.6 F (37 C)  TempSrc: Oral  SpO2: 97%  Weight: 192 lb 3.2 oz (87.2 kg)  Height: 5\' 10"  (1.778 m)   Body mass index is 27.58 kg/m. Wt Readings from Last 3 Encounters:  05/30/19 192 lb 3.2 oz (87.2 kg)  12/20/18 193 lb (87.5 kg)  11/29/18 189 lb (85.7 kg)    Physical Exam Constitutional:      General: He is not in acute distress.    Appearance: He is well-developed.  HENT:     Head: Normocephalic and atraumatic.     Nose: Nose normal.  Eyes:     Conjunctiva/sclera: Conjunctivae normal.     Pupils: Pupils are equal, round, and reactive to light.  Cardiovascular:     Rate and Rhythm: Normal rate and regular rhythm.     Heart sounds: Normal heart sounds. No murmur. No friction rub. No gallop.   Pulmonary:     Effort: Pulmonary effort is normal.     Breath sounds: Normal breath sounds.  Abdominal:     General: Bowel sounds are normal. There is no distension.     Palpations: Abdomen is soft.     Tenderness: There is no abdominal tenderness.  Musculoskeletal: Normal range of motion.        General: No tenderness.  Skin:    General: Skin is warm and dry.     Findings: Rash (large beefy red rash to groin) present.  Neurological:     Mental Status: He is alert and oriented to person, place, and  time.     Cranial Nerves: No cranial nerve deficit.     Coordination: Coordination normal.  Psychiatric:  Behavior: Behavior normal.        Thought Content: Thought content normal.        Judgment: Judgment normal.     Labs reviewed: Basic Metabolic Panel: Recent Labs    11/25/18 0808  NA 138  K 4.7  CL 102  CO2 28  GLUCOSE 100*  BUN 10  CREATININE 0.88  CALCIUM 9.2  TSH 1.49   Liver Function Tests: Recent Labs    11/25/18 0808  AST 23  ALT 24  BILITOT 0.4  PROT 6.5   No results for input(s): LIPASE, AMYLASE in the last 8760 hours. No results for input(s): AMMONIA in the last 8760 hours. CBC: Recent Labs    11/25/18 0808  WBC 5.6  NEUTROABS 2,570  HGB 14.0  HCT 41.5  MCV 92.6  PLT 195   Lipid Panel: Recent Labs    11/25/18 0808  CHOL 196  HDL 81  LDLCALC 98  TRIG 78  CHOLHDL 2.4   TSH: Recent Labs    11/25/18 0808  TSH 1.49   A1C: Lab Results  Component Value Date   HGBA1C 5.1 05/28/2018     Assessment/Plan 1. Yeast infection of the skin -significant yeast to groin.  - fluconazole (DIFLUCAN) 100 MG tablet; Take 1 tablet (100 mg total) by mouth daily.  Dispense: 5 tablet; Refill: 0 - nystatin cream (MYCOSTATIN); Apply 1 application topically 2 (two) times daily.  Dispense: 30 g; Refill: 0 -to notify if symptoms worsen or fail to improve with treatment. May need nystatin powder to use PRN.  2. Attention deficit hyperactivity disorder (ADHD), combined type Stable on current regimen. - amphetamine-dextroamphetamine (ADDERALL XR) 10 MG 24 hr capsule; Take one tablet once a day for ADD  Dispense: 30 capsule; Refill: 0  3. Dupuytren's contracture of both hands Ongoing, stable at this time. Does not effect ADLs, occasional discomfort, Discussed referral to orthopedics but pt reports he would like to hold off at this time. Will continue to monitor.   4. Screening for prostate cancer PSA in normal range, pt with family hx of prostate  cancer.   5. Hyperglycemia -noted on last lab, encouraged dietary modifications with routine exercise.   6. Diverticulosis of colon without hemorrhage Stable, flare in December where he took flagyl and cipro with good effect  7. Adjustment disorder with anxiety -stable. Encouraged to exercise. Continues on klonopin which has been very effective   8. Benign essential HTN -slightly elevated today, improved on recheck to 130/88, not currently on medication, continues on diet modification   9. Chronic tension-type headache, intractable Stable uses floricet PRN 1-2 times a month  10. Irritable bowel syndrome with diarrhea Currently having cramping, took first bentyl this morning. States generally after a few doses improves. To monitor and notify if worsens.   Next appt: 6 months with lab work before [employer]. Biagio Borg  [facility] [phone]

## 2019-06-02 ENCOUNTER — Telehealth: Payer: Self-pay | Admitting: *Deleted

## 2019-06-02 DIAGNOSIS — K5792 Diverticulitis of intestine, part unspecified, without perforation or abscess without bleeding: Secondary | ICD-10-CM

## 2019-06-02 MED ORDER — CIPROFLOXACIN HCL 500 MG PO TABS: 500.0000 mg | ORAL_TABLET | Freq: Two times a day (BID) | ORAL | 0 refills | Status: DC

## 2019-06-02 MED ORDER — METRONIDAZOLE 500 MG PO TABS: 500.0000 mg | ORAL_TABLET | Freq: Three times a day (TID) | ORAL | 0 refills | Status: DC

## 2019-06-02 NOTE — Telephone Encounter (Signed)
Patient called and stated that he was seen on Friday. Stated that this morning his abdomen is still very tender and sore. Stated that he had a Temperature Saturday of 100.5, Sunday 99.5 and Monday 99.0. Stated that he has had some pain with urination this weekend and wonders if he has UTI or Diverticulitis instead of IBS. Wonders if he needs antibiotic treatment for this.  Please Advise.

## 2019-06-02 NOTE — Telephone Encounter (Signed)
LMOM to return call.

## 2019-06-02 NOTE — Telephone Encounter (Signed)
Will treat for diverticular flare, antibiotics sent to pharmacy, have him increase hydration as well. If abdominal discomfort/fever/burning persist while on antibiotic will need additional work up and needs to be seen.

## 2019-06-03 NOTE — Telephone Encounter (Signed)
Patient notified and agreed.  

## 2019-06-21 ENCOUNTER — Other Ambulatory Visit: Payer: Self-pay | Admitting: Nurse Practitioner

## 2019-06-26 ENCOUNTER — Encounter: Payer: Self-pay | Admitting: Nurse Practitioner

## 2019-06-26 MED ORDER — NYSTATIN 100000 UNIT/GM EX POWD: Freq: Three times a day (TID) | CUTANEOUS | 2 refills | Status: DC

## 2019-06-30 ENCOUNTER — Other Ambulatory Visit: Payer: Self-pay | Admitting: *Deleted

## 2019-06-30 DIAGNOSIS — F902 Attention-deficit hyperactivity disorder, combined type: Secondary | ICD-10-CM

## 2019-06-30 MED ORDER — AMPHETAMINE-DEXTROAMPHET ER 10 MG PO CP24: ORAL_CAPSULE | ORAL | 0 refills | Status: DC

## 2019-06-30 NOTE — Telephone Encounter (Signed)
Patient requested refill Epic LR: 05/30/2019 Pended Rx and sent to [facility] for approval.

## 2019-07-08 ENCOUNTER — Other Ambulatory Visit: Payer: Self-pay | Admitting: Nurse Practitioner

## 2019-07-08 DIAGNOSIS — L309 Dermatitis, unspecified: Secondary | ICD-10-CM

## 2019-07-08 DIAGNOSIS — F419 Anxiety disorder, unspecified: Secondary | ICD-10-CM

## 2019-07-08 NOTE — Telephone Encounter (Signed)
Clonazepam last filled 02/18/2019 in Epic. RX request forwarded to Marlowe Sax, NP to review [place] Database and approve RX if necessary

## 2019-07-30 ENCOUNTER — Other Ambulatory Visit: Payer: Self-pay | Admitting: *Deleted

## 2019-07-30 DIAGNOSIS — F902 Attention-deficit hyperactivity disorder, combined type: Secondary | ICD-10-CM

## 2019-07-30 MED ORDER — AMPHETAMINE-DEXTROAMPHET ER 10 MG PO CP24: ORAL_CAPSULE | ORAL | 0 refills | Status: DC

## 2019-07-30 NOTE — Telephone Encounter (Signed)
Patient requested refill Epic LR: 06/30/2019 Pended Rx and sent to [facility] for approval.  

## 2019-08-29 ENCOUNTER — Other Ambulatory Visit: Payer: Self-pay | Admitting: *Deleted

## 2019-08-29 DIAGNOSIS — F902 Attention-deficit hyperactivity disorder, combined type: Secondary | ICD-10-CM

## 2019-08-29 MED ORDER — AMPHETAMINE-DEXTROAMPHET ER 10 MG PO CP24: ORAL_CAPSULE | ORAL | 0 refills | Status: DC

## 2019-08-29 NOTE — Telephone Encounter (Signed)
Patient requested refill Epic LR: 07/30/2019 Pended Rx and sent to [facility] for approval.  

## 2019-09-08 ENCOUNTER — Encounter: Payer: Self-pay | Admitting: Nurse Practitioner

## 2019-09-09 ENCOUNTER — Other Ambulatory Visit: Payer: Self-pay

## 2019-09-09 ENCOUNTER — Ambulatory Visit (INDEPENDENT_AMBULATORY_CARE_PROVIDER_SITE_OTHER): Payer: 59

## 2019-09-09 DIAGNOSIS — Z23 Encounter for immunization: Secondary | ICD-10-CM | POA: Diagnosis not present

## 2019-09-19 ENCOUNTER — Other Ambulatory Visit: Payer: Self-pay | Admitting: Nurse Practitioner

## 2019-09-22 ENCOUNTER — Other Ambulatory Visit: Payer: Self-pay | Admitting: Family

## 2019-09-22 DIAGNOSIS — F419 Anxiety disorder, unspecified: Secondary | ICD-10-CM

## 2019-09-22 NOTE — Telephone Encounter (Signed)
Unable to check [place] database due to access routing to provider for review and approval.

## 2019-09-29 ENCOUNTER — Other Ambulatory Visit: Payer: Self-pay | Admitting: *Deleted

## 2019-09-29 DIAGNOSIS — F902 Attention-deficit hyperactivity disorder, combined type: Secondary | ICD-10-CM

## 2019-09-29 MED ORDER — AMPHETAMINE-DEXTROAMPHET ER 10 MG PO CP24: ORAL_CAPSULE | ORAL | 0 refills | Status: DC

## 2019-09-29 NOTE — Telephone Encounter (Signed)
Patient requested refill Epic LR 08/29/2019 Pended Rx and sent to [facility] for approval.  

## 2019-10-29 ENCOUNTER — Other Ambulatory Visit: Payer: Self-pay | Admitting: *Deleted

## 2019-10-29 DIAGNOSIS — F902 Attention-deficit hyperactivity disorder, combined type: Secondary | ICD-10-CM

## 2019-10-29 MED ORDER — AMPHETAMINE-DEXTROAMPHET ER 10 MG PO CP24: ORAL_CAPSULE | ORAL | 0 refills | Status: DC

## 2019-10-29 NOTE — Telephone Encounter (Signed)
Patient requested refill [place] Verified LR: 09/29/2019 Pended Rx and sent to Jessica for approval.  

## 2019-11-28 ENCOUNTER — Other Ambulatory Visit: Payer: Self-pay | Admitting: *Deleted

## 2019-11-28 DIAGNOSIS — F902 Attention-deficit hyperactivity disorder, combined type: Secondary | ICD-10-CM

## 2019-11-28 MED ORDER — AMPHETAMINE-DEXTROAMPHET ER 10 MG PO CP24: ORAL_CAPSULE | ORAL | 0 refills | Status: DC

## 2019-11-28 NOTE — Telephone Encounter (Signed)
Patient requested refill [place] Verified LR: 01/08/2019 Pended Rx and sent to [facility] for approval.  

## 2019-12-01 ENCOUNTER — Other Ambulatory Visit: Payer: Self-pay | Admitting: Family

## 2019-12-01 ENCOUNTER — Ambulatory Visit: Payer: 59

## 2019-12-01 ENCOUNTER — Other Ambulatory Visit: Payer: Self-pay

## 2019-12-02 LAB — COMPLETE METABOLIC PANEL WITH GFR
AG Ratio: 2.2 (calc) (ref 1.0–2.5)
ALT: 55 U/L — ABNORMAL HIGH (ref 9–46)
AST: 31 U/L (ref 10–35)
Albumin: 4.4 g/dL (ref 3.6–5.1)
Alkaline phosphatase (APISO): 65 U/L (ref 35–144)
BUN: 16 mg/dL (ref 7–25)
CO2: 28 mmol/L (ref 20–32)
Calcium: 9.1 mg/dL (ref 8.6–10.3)
Chloride: 103 mmol/L (ref 98–110)
Creat: 0.96 mg/dL (ref 0.70–1.25)
GFR, Est African American: 98 mL/min/{1.73_m2} (ref >=60)
GFR, Est Non African American: 84 mL/min/{1.73_m2} (ref >=60)
Globulin: 2 g/dL (calc) (ref 1.9–3.7)
Glucose, Bld: 124 mg/dL — ABNORMAL HIGH (ref 65–99)
Potassium: 4.5 mmol/L (ref 3.5–5.3)
Sodium: 139 mmol/L (ref 135–146)
Total Bilirubin: 0.5 mg/dL (ref 0.2–1.2)
Total Protein: 6.4 g/dL (ref 6.1–8.1)

## 2019-12-02 LAB — LIPID PANEL
Cholesterol: 175 mg/dL (ref <200)
HDL: 76 mg/dL (ref >=40)
LDL Cholesterol (Calc): 86 mg/dL (calc)
Non-HDL Cholesterol (Calc): 99 mg/dL (calc) (ref <130)
Total CHOL/HDL Ratio: 2.3 (calc) (ref <5.0)
Triglycerides: 46 mg/dL (ref <150)

## 2019-12-02 LAB — TSH: TSH: 2.46 mIU/L (ref 0.40–4.50)

## 2019-12-02 LAB — CBC WITH DIFFERENTIAL/PLATELET
Absolute Monocytes: 708 cells/uL (ref 200–950)
Basophils Absolute: 29 cells/uL (ref 0–200)
Basophils Relative: 0.5 %
Eosinophils Absolute: 464 cells/uL (ref 15–500)
Eosinophils Relative: 8 %
HCT: 43.7 % (ref 38.5–50.0)
Hemoglobin: 14.6 g/dL (ref 13.2–17.1)
Lymphs Abs: 1995 cells/uL (ref 850–3900)
MCH: 31.7 pg (ref 27.0–33.0)
MCHC: 33.4 g/dL (ref 32.0–36.0)
MCV: 94.8 fL (ref 80.0–100.0)
MPV: 10.4 fL (ref 7.5–12.5)
Monocytes Relative: 12.2 %
Neutro Abs: 2604 cells/uL (ref 1500–7800)
Neutrophils Relative %: 44.9 %
Platelets: 176 10*3/uL (ref 140–400)
RBC: 4.61 10*6/uL (ref 4.20–5.80)
RDW: 12.4 % (ref 11.0–15.0)
Total Lymphocyte: 34.4 %
WBC: 5.8 10*3/uL (ref 3.8–10.8)

## 2019-12-02 LAB — HEMOGLOBIN A1C
Hgb A1c MFr Bld: 5.4 % of total Hgb (ref <5.7)
Mean Plasma Glucose: 108 (calc)
eAG (mmol/L): 6 (calc)

## 2019-12-04 ENCOUNTER — Ambulatory Visit: Payer: 59 | Admitting: Nurse Practitioner

## 2019-12-05 ENCOUNTER — Other Ambulatory Visit: Payer: Self-pay

## 2019-12-05 ENCOUNTER — Ambulatory Visit (INDEPENDENT_AMBULATORY_CARE_PROVIDER_SITE_OTHER): Payer: 59 | Admitting: Nurse Practitioner

## 2019-12-05 ENCOUNTER — Encounter: Payer: Self-pay | Admitting: Nurse Practitioner

## 2019-12-05 VITALS — BP 124/70 | HR 72 | Temp 97.5°F | Ht 70.0 in | Wt 198.0 lb

## 2019-12-05 DIAGNOSIS — H60501 Unspecified acute noninfective otitis externa, right ear: Secondary | ICD-10-CM

## 2019-12-05 DIAGNOSIS — R739 Hyperglycemia, unspecified: Secondary | ICD-10-CM

## 2019-12-05 DIAGNOSIS — G44221 Chronic tension-type headache, intractable: Secondary | ICD-10-CM

## 2019-12-05 DIAGNOSIS — M720 Palmar fascial fibromatosis [Dupuytren]: Secondary | ICD-10-CM | POA: Diagnosis not present

## 2019-12-05 DIAGNOSIS — F419 Anxiety disorder, unspecified: Secondary | ICD-10-CM | POA: Diagnosis not present

## 2019-12-05 DIAGNOSIS — E782 Mixed hyperlipidemia: Secondary | ICD-10-CM

## 2019-12-05 DIAGNOSIS — I10 Essential (primary) hypertension: Secondary | ICD-10-CM

## 2019-12-05 DIAGNOSIS — B372 Candidiasis of skin and nail: Secondary | ICD-10-CM

## 2019-12-05 MED ORDER — CLONAZEPAM 0.5 MG PO TABS: 0.5000 mg | ORAL_TABLET | Freq: Every day | ORAL | 0 refills | Status: DC | PRN

## 2019-12-05 MED ORDER — CIPROFLOXACIN-DEXAMETHASONE 0.3-0.1 % OT SUSP: 4.0000 [drp] | Freq: Two times a day (BID) | OTIC | 0 refills | Status: DC

## 2019-12-05 MED ORDER — BUTALBITAL-APAP-CAFFEINE 50-325-40 MG PO TABS: ORAL_TABLET | ORAL | 1 refills | Status: DC

## 2019-12-05 NOTE — Patient Instructions (Signed)
To use ear drops into right ear twice daily for 1 week  To use muscle rub to thumb- can use aleve twice daily with food for 3 days for flare of pain

## 2019-12-05 NOTE — Progress Notes (Signed)
Careteam: Patient Care Team: Sharon Seller, NP as PCP - General (Geriatric Medicine) Drema Halon, MD as Consulting Physician (Otolaryngology) Violeta Gelinas, MD as Consulting Physician (General Surgery) Jethro Bolus, MD as Consulting Physician (Ophthalmology) Meryl Dare, MD as Consulting Physician (Gastroenterology) Emelia Loron, MD as Consulting Physician (General Surgery) Nita Sells, MD (Dermatology) Webb Silversmith, MD (Gastroenterology)  Advanced Directive information Does Patient Have a Medical Advance Directive?: Yes, Type of Advance Directive: Healthcare Power of [place];Living will, Does patient want to make changes to medical advance directive?: No - Patient declined  Allergies  Allergen Reactions  . Amitriptyline   . Zolpidem Tartrate Other (See Comments)    Severe headache  . Amoxicillin-Pot Clavulanate Rash  . Propoxyphene Hcl Nausea Only    Chief Complaint  Patient presents with  . Medical Management of Chronic Issues    6 month follow-up. Patient c/o itchy ears. Sore thumb on right hand off/on for a while.   Marland Kitchen Best Practice Recommendations    Discuss HIV screening recommendations   . Medication Management    Takes clonazepam 2-3 times weekly on average      HPI: Patient is a 63 y.o. male seen in the office today for routine follow up.  ADHD- continues on adderall- will update contract today  Chronic headache- stable, recently had; Fioricet effective but did not have recently.   IBS/diverticulosis- occasional flare/cramping. Bentyl controls.   Hypertension- at goal with diet, not on medication   Hyperlipidemia- LDL down to 86, on lovastatin  Hyperglycemia- a1c 5.4, glucose 124 on fasting labs. "needs to leave the candy alone"  Anxiety- less with no job. "retired" using clonazepam 0.5 2-3 times a week.   Walking more but then his feet started hurting, not walking on the really cold morning.   Occasionally sore on  thumb- after using hammer and power tools.   Left ear itchy, right ear slightly painful full feeling.  Review of Systems:  Review of Systems  Constitutional: Negative for chills, fever and weight loss.  HENT: Negative for tinnitus.   Respiratory: Negative for cough, sputum production and shortness of breath.   Cardiovascular: Negative for chest pain, palpitations and leg swelling.  Gastrointestinal: Negative for abdominal pain, constipation, diarrhea and heartburn.  Genitourinary: Negative for dysuria, frequency and urgency.  Musculoskeletal: Positive for joint pain (to right thumb occasionally). Negative for back pain, falls and myalgias.  Skin: Negative.   Neurological: Negative for dizziness and headaches.  Psychiatric/Behavioral: Negative for depression and memory loss. The patient does not have insomnia.     Past Medical History:  Diagnosis Date  . Abdominal pain, left lower quadrant   . Actinic keratosis   . ADD (attention deficit disorder)   . Alcohol abuse, unspecified   . Anxiety   . Anxiety state, unspecified   . Arthritis    shoulders and hips  . Atrial fibrillation (HCC)   . Attention deficit disorder without mention of hyperactivity   . Cervicalgia   . Depressive disorder, not elsewhere classified   . Eczema 11/2011   right hand  . Edema   . Encounter for long-term (current) use of other medications   . Headache(784.0)    tension or sinus  . High cholesterol   . Inguinal hernia 11/2011   right  . Inguinal hernia without mention of obstruction or gangrene, unilateral or unspecified, (not specified as recurrent)   . Insomnia, unspecified   . Internal hemorrhoids without mention of complication   . Irritable  bowel syndrome   . Irritable bowel syndrome (IBS)   . Lumbago   . Major depressive disorder, recurrent episode, severe, without mention of psychotic behavior   . Obesity, unspecified   . Other and unspecified hyperlipidemia   . Other atopic dermatitis  and related conditions   . Other disorders of vitreous   . Other seborrheic keratosis   . Other specified visual disturbances   . Pain in joint, pelvic region and thigh   . Pain in joint, site unspecified   . Pathologic fracture of vertebrae   . Poisoning and toxic reactions caused by other specified animals and plants   . Recent retinal detachment, partial, with single defect   . Routine general medical examination at a health care facility   . Sciatica   . Sinus infection 11/2011   started antibiotic 12/18/2011 x 7 days; current cough  . Special screening for malignant neoplasm of prostate   . Tension headache   . Type II or unspecified type diabetes mellitus without mention of complication, not stated as uncontrolled   . Unspecified essential hypertension    Past Surgical History:  Procedure Laterality Date  . APPENDECTOMY  1987  . CATARACT EXTRACTION  2011   right eye  . CATARACT EXTRACTION W/ INTRAOCULAR LENS IMPLANT Left 04/04/2017   Dr. Dawna Part  . EYE SURGERY Right 09/13/2018   Dr.Hanes with [facility]   . HEMORRHOID SURGERY  04/15/14   thrombosed int. Hemorrhoid. Dr. Violeta Gelinas  . HERNIA REPAIR  05/03/2011   left  . INGUINAL HERNIA REPAIR  12/28/2011   Procedure: HERNIA REPAIR INGUINAL ADULT;  Surgeon: Emelia Loron, MD;  Location: [facility];  Service: General;  Laterality: Right;  . NASAL POLYP SURGERY  01/05/2011   DR. NEWMAN   . NASAL SINUS SURGERY  12/2010  . removed anal warts  1976   Dr Terri Piedra   . RETINAL DETACHMENT REPAIR W/ SCLERAL BUCKLE LE  07/22/2008   right eye; pars plana vitrectomy  . TONSILLECTOMY AND ADENOIDECTOMY  1964  . TYMPANOSTOMY TUBE PLACEMENT  June 2015   Dr. Salena Saner. [place]   Social History:   reports that he quit smoking about 22 years ago. His smoking use included cigarettes. He has never used smokeless tobacco. He reports current alcohol use. He reports that he does not use drugs.  Family History  Problem Relation Age  of Onset  . Stroke Father   . Cancer Brother        prostate  . Cancer Paternal Grandfather        colon    Medications: Patient's Medications  New Prescriptions   No medications on file  Previous Medications   AMPHETAMINE-DEXTROAMPHETAMINE (ADDERALL XR) 10 MG 24 HR CAPSULE    Take one tablet once a day for ADD   BUTALBITAL-ACETAMINOPHEN-CAFFEINE (FIORICET) 50-325-40 MG TABLET    Take one tablet daily as needed for headache   CETIRIZINE (ZYRTEC) 10 MG TABLET    1 by mouth 1-3 times weekly   CLONAZEPAM (KLONOPIN) 0.5 MG TABLET    TAKE 1 TABLET(0.5 MG) BY MOUTH THREE TIMES DAILY AS NEEDED   DICYCLOMINE (BENTYL) 20 MG TABLET    TAKE 1 TABLET BY MOUTH EVERY 6 HOURS AS NEEDED TO CONTROL PAIN FROM IRRITABLE BOWEL SYNDROME   DORZOLAMIDEL-TIMOLOL (COSOPT) 22.3-6.8 MG/ML SOLN OPHTHALMIC SOLUTION    Place 1 drop into the right eye daily.   FLUTICASONE (FLONASE) 50 MCG/ACT NASAL SPRAY    Place 2 sprays into the nose  daily.    HALOBETASOL (ULTRAVATE) 0.05 % CREAM    APPLY EXTERNALLY TO RASH UP TO THREE TIMES DAILY   LOVASTATIN (MEVACOR) 20 MG TABLET    TAKE 1 TABLET BY MOUTH AT BEDTIME   MULTIPLE VITAMIN (MULTIVITAMIN) TABLET    Take 1 tablet by mouth daily.     NYSTATIN (MYCOSTATIN/NYSTOP) POWDER    Apply topically 3 (three) times daily.  Modified Medications   No medications on file  Discontinued Medications   CIPROFLOXACIN (CIPRO) 500 MG TABLET    Take 1 tablet (500 mg total) by mouth 2 (two) times daily.   METRONIDAZOLE (FLAGYL) 500 MG TABLET    Take 1 tablet (500 mg total) by mouth 3 (three) times daily.    Physical Exam:  Vitals:   12/05/19 0933  BP: 124/70  Pulse: 72  Temp: (!) 97.5 F (36.4 C)  TempSrc: Temporal  SpO2: 98%  Weight: 198 lb (89.8 kg)  Height: 5\' 10"  (1.778 m)   Body mass index is 28.41 kg/m. Wt Readings from Last 3 Encounters:  12/05/19 198 lb (89.8 kg)  05/30/19 192 lb 3.2 oz (87.2 kg)  12/20/18 193 lb (87.5 kg)    Physical Exam Constitutional:       General: He is not in acute distress.    Appearance: He is well-developed. He is not diaphoretic.  HENT:     Head: Normocephalic and atraumatic.     Right Ear: Drainage present.     Left Ear: Ear canal and external ear normal.  Eyes:     Conjunctiva/sclera: Conjunctivae normal.     Pupils: Pupils are equal, round, and reactive to light.  Cardiovascular:     Rate and Rhythm: Normal rate and regular rhythm.     Heart sounds: Normal heart sounds.  Pulmonary:     Effort: Pulmonary effort is normal.     Breath sounds: Normal breath sounds.  Abdominal:     General: Bowel sounds are normal.     Palpations: Abdomen is soft.  Musculoskeletal:        General: No tenderness.     Cervical back: Normal range of motion and neck supple.  Skin:    General: Skin is warm and dry.  Neurological:     Mental Status: He is alert and oriented to person, place, and time.     Labs reviewed: Basic Metabolic Panel: Recent Labs    12/01/19 0842  NA 139  K 4.5  CL 103  CO2 28  GLUCOSE 124*  BUN 16  CREATININE 0.96  CALCIUM 9.1  TSH 2.46   Liver Function Tests: Recent Labs    12/01/19 0842  AST 31  ALT 55*  BILITOT 0.5  PROT 6.4   No results for input(s): LIPASE, AMYLASE in the last 8760 hours. No results for input(s): AMMONIA in the last 8760 hours. CBC: Recent Labs    12/01/19 0842  WBC 5.8  NEUTROABS 2,604  HGB 14.6  HCT 43.7  MCV 94.8  PLT 176   Lipid Panel: Recent Labs    12/01/19 0842  CHOL 175  HDL 76  LDLCALC 86  TRIG 46  CHOLHDL 2.3   TSH: Recent Labs    12/01/19 0842  TSH 2.46   A1C: Lab Results  Component Value Date   HGBA1C 5.4 12/01/2019     Assessment/Plan 1. Chronic tension-type headache, intractable -stable, occasionally worse and fioricet helps abort severe headaches. Rarely uses but would like for hard to control headaches - butalbital-acetaminophen-caffeine (FIORICET)  50-325-40 MG tablet; Take one tablet daily as needed for headache   Dispense: 30 tablet; Refill: 1  2. Anxiety -improved since he has been retired. Encouraged to continue exercises. Uses clonazepam rarely when he gets "worked up" - clonazePAM (KLONOPIN) 0.5 MG tablet; Take 1 tablet (0.5 mg total) by mouth daily as needed for anxiety.  Dispense: 30 tablet; Refill: 0  3. Acute otitis externa of right ear, unspecified type - ciprofloxacin-dexamethasone (CIPRODEX) OTIC suspension; Place 4 drops into the right ear 2 (two) times daily.  Dispense: 7.5 mL; Refill: 0  4. Dupuytren's contracture of both hands -ongoing, discussed orthopedic referral but would like to hold off for now. Will call if symptoms worsen and he would like referral.   5. Benign essential HTN Diet controlled. Continue on diet modifications.  - CBC with Differential/Platelet; Future  6. Yeast infection of the skin Improved, can use nystatin powder as needed  7. Hyperglycemia -diet modifications encouraged - COMPLETE METABOLIC PANEL WITH GFR; Future - Hemoglobin A1c; Future  8. Mixed hyperlipidemia -controlled on lovastatin.  - Lipid Panel; Future - COMPLETE METABOLIC PANEL WITH GFR; Future  Next appt: 1 year  Shanda BumpsJessica K. Biagio BorgEubanks, AGNP  [facility] [phone]

## 2019-12-09 ENCOUNTER — Telehealth: Payer: Self-pay | Admitting: *Deleted

## 2019-12-09 DIAGNOSIS — H60501 Unspecified acute noninfective otitis externa, right ear: Secondary | ICD-10-CM

## 2019-12-09 MED ORDER — OFLOXACIN 0.3 % OT SOLN: 10.0000 [drp] | Freq: Every day | OTIC | 0 refills | Status: AC

## 2019-12-09 NOTE — Telephone Encounter (Signed)
Patient notified and agreed.  

## 2019-12-09 NOTE — Telephone Encounter (Signed)
Patient called and stated that he went to Pick up his medication for his ear- Ciprodex and is costs $180.00 and he cannot afford this. Patient stated that he needs an alternative or he wants to know if he can take oral antibiotic instead. Please Advise.

## 2019-12-09 NOTE — Telephone Encounter (Signed)
Needs to be topical, sent new Rx to pharmacy

## 2019-12-18 ENCOUNTER — Other Ambulatory Visit: Payer: Self-pay | Admitting: Nurse Practitioner

## 2019-12-22 ENCOUNTER — Other Ambulatory Visit: Payer: Self-pay | Admitting: Nurse Practitioner

## 2019-12-22 DIAGNOSIS — F419 Anxiety disorder, unspecified: Secondary | ICD-10-CM

## 2019-12-24 ENCOUNTER — Other Ambulatory Visit: Payer: Self-pay | Admitting: *Deleted

## 2019-12-24 DIAGNOSIS — F419 Anxiety disorder, unspecified: Secondary | ICD-10-CM

## 2019-12-24 MED ORDER — CLONAZEPAM 0.5 MG PO TABS: 0.5000 mg | ORAL_TABLET | Freq: Every day | ORAL | 0 refills | Status: DC | PRN

## 2019-12-24 NOTE — Telephone Encounter (Signed)
Patient requested refill. Phoned to pharmacy.  

## 2019-12-29 ENCOUNTER — Other Ambulatory Visit: Payer: Self-pay

## 2019-12-29 DIAGNOSIS — F902 Attention-deficit hyperactivity disorder, combined type: Secondary | ICD-10-CM

## 2019-12-29 MED ORDER — AMPHETAMINE-DEXTROAMPHET ER 10 MG PO CP24: ORAL_CAPSULE | ORAL | 0 refills | Status: DC

## 2019-12-29 NOTE — Telephone Encounter (Signed)
Patient called for refill on Adderall. Unable to check [place] base. Last refill was 11/28/2019. Narcotic Contract updated 12/05/2019. Medication pended and sent to [name], NP.

## 2020-01-28 ENCOUNTER — Other Ambulatory Visit: Payer: Self-pay | Admitting: *Deleted

## 2020-01-28 DIAGNOSIS — F902 Attention-deficit hyperactivity disorder, combined type: Secondary | ICD-10-CM

## 2020-01-28 MED ORDER — AMPHETAMINE-DEXTROAMPHET ER 10 MG PO CP24: ORAL_CAPSULE | ORAL | 0 refills | Status: DC

## 2020-01-28 NOTE — Telephone Encounter (Signed)
Patient requested refill [place] Database Verified LR: 12/29/2019 Pended Rx and sent to [facility] for approval.

## 2020-02-09 ENCOUNTER — Other Ambulatory Visit: Payer: Self-pay | Admitting: Nurse Practitioner

## 2020-02-09 DIAGNOSIS — F419 Anxiety disorder, unspecified: Secondary | ICD-10-CM

## 2020-02-09 NOTE — Telephone Encounter (Signed)
Rx last filled in Epic12/30/2020  Non opioid treatment agreement on file from 12/05/2019

## 2020-02-27 ENCOUNTER — Other Ambulatory Visit: Payer: Self-pay | Admitting: *Deleted

## 2020-02-27 DIAGNOSIS — F902 Attention-deficit hyperactivity disorder, combined type: Secondary | ICD-10-CM

## 2020-02-27 MED ORDER — AMPHETAMINE-DEXTROAMPHET ER 10 MG PO CP24: ORAL_CAPSULE | ORAL | 0 refills | Status: DC

## 2020-02-27 NOTE — Telephone Encounter (Signed)
Patient requested refill [place] Database Verified LR: 01/28/2020 Pended Rx and sent to [facility] for approval.  Contract updated.

## 2020-03-01 ENCOUNTER — Other Ambulatory Visit: Payer: Self-pay | Admitting: Internal Medicine

## 2020-03-18 ENCOUNTER — Encounter: Payer: Self-pay | Admitting: Nurse Practitioner

## 2020-03-18 DIAGNOSIS — T148XXA Other injury of unspecified body region, initial encounter: Secondary | ICD-10-CM

## 2020-03-24 ENCOUNTER — Other Ambulatory Visit: Payer: Self-pay | Admitting: Nurse Practitioner

## 2020-03-24 DIAGNOSIS — F419 Anxiety disorder, unspecified: Secondary | ICD-10-CM

## 2020-03-29 ENCOUNTER — Other Ambulatory Visit: Payer: Self-pay

## 2020-03-29 DIAGNOSIS — F902 Attention-deficit hyperactivity disorder, combined type: Secondary | ICD-10-CM

## 2020-03-29 MED ORDER — AMPHETAMINE-DEXTROAMPHET ER 10 MG PO CP24: ORAL_CAPSULE | ORAL | 0 refills | Status: DC

## 2020-03-29 NOTE — Telephone Encounter (Signed)
Patient called and requested a refill on "Adderall". Patient was last seen 03/18/2020 and upcoming appointment on 12/06/2020. Patient has signed Opioid  contract on file 12/09/2020. Please Advise.

## 2020-04-12 ENCOUNTER — Encounter: Payer: Self-pay | Admitting: Nurse Practitioner

## 2020-04-12 NOTE — Telephone Encounter (Signed)
Message routed to provider Sharon Seller, NP .

## 2020-04-28 ENCOUNTER — Other Ambulatory Visit: Payer: Self-pay | Admitting: *Deleted

## 2020-04-28 DIAGNOSIS — F902 Attention-deficit hyperactivity disorder, combined type: Secondary | ICD-10-CM

## 2020-04-28 MED ORDER — AMPHETAMINE-DEXTROAMPHET ER 10 MG PO CP24: ORAL_CAPSULE | ORAL | 0 refills | Status: DC

## 2020-04-28 NOTE — Telephone Encounter (Signed)
Patient requested refill [place] Database Verified LR: 03/29/2020 Pended Rx and sent to [facility] for approval.  Contract updated.

## 2020-05-28 ENCOUNTER — Other Ambulatory Visit: Payer: Self-pay | Admitting: *Deleted

## 2020-05-28 DIAGNOSIS — F902 Attention-deficit hyperactivity disorder, combined type: Secondary | ICD-10-CM

## 2020-05-28 MED ORDER — AMPHETAMINE-DEXTROAMPHET ER 10 MG PO CP24: ORAL_CAPSULE | ORAL | 0 refills | Status: DC

## 2020-05-28 NOTE — Telephone Encounter (Signed)
Patient called requesting refill on Adderall Epic LR: 04/28/20 Pended Rx and sent to [facility] for approval Shanda Bumps out of office.)

## 2020-06-22 ENCOUNTER — Other Ambulatory Visit: Payer: Self-pay | Admitting: Nurse Practitioner

## 2020-06-29 ENCOUNTER — Other Ambulatory Visit: Payer: Self-pay

## 2020-06-29 DIAGNOSIS — F902 Attention-deficit hyperactivity disorder, combined type: Secondary | ICD-10-CM

## 2020-06-29 MED ORDER — AMPHETAMINE-DEXTROAMPHET ER 10 MG PO CP24: ORAL_CAPSULE | ORAL | 0 refills | Status: DC

## 2020-06-29 NOTE — Telephone Encounter (Signed)
Incoming call received form patient requesting refill on Adderall   RX last filled in Epic on 05/28/2020  Treatment agreement on file from 12/05/2019  Patient aware to call the pharmacy for refills in the future

## 2020-07-14 ENCOUNTER — Other Ambulatory Visit: Payer: Self-pay | Admitting: Nurse Practitioner

## 2020-07-14 DIAGNOSIS — F419 Anxiety disorder, unspecified: Secondary | ICD-10-CM

## 2020-07-14 NOTE — Telephone Encounter (Signed)
Pharmacy requested refill.  Pended Rx and sent to [facility] for approval.  

## 2020-07-29 ENCOUNTER — Other Ambulatory Visit: Payer: Self-pay

## 2020-07-29 DIAGNOSIS — F902 Attention-deficit hyperactivity disorder, combined type: Secondary | ICD-10-CM

## 2020-07-29 MED ORDER — AMPHETAMINE-DEXTROAMPHET ER 10 MG PO CP24: ORAL_CAPSULE | ORAL | 0 refills | Status: DC

## 2020-07-29 NOTE — Telephone Encounter (Signed)
Patient aware message received and rx sent

## 2020-07-29 NOTE — Telephone Encounter (Signed)
Incoming call received from patient requesting refill on Adderall  RX last filled in epic on 06/29/2020  Treatment agreement on file from 12/05/2019

## 2020-08-25 ENCOUNTER — Other Ambulatory Visit: Payer: Self-pay | Admitting: Nurse Practitioner

## 2020-08-25 DIAGNOSIS — F419 Anxiety disorder, unspecified: Secondary | ICD-10-CM

## 2020-08-25 NOTE — Telephone Encounter (Signed)
Non-opoid contract signed 11/2019.  Last refill  07/15/19  Last OV 12/05/19  Sent to [facility] since [place] in on vacation and Carilyn Goodpasture is covering her Inbasket.

## 2020-08-31 ENCOUNTER — Other Ambulatory Visit: Payer: Self-pay | Admitting: *Deleted

## 2020-08-31 DIAGNOSIS — F902 Attention-deficit hyperactivity disorder, combined type: Secondary | ICD-10-CM

## 2020-08-31 MED ORDER — AMPHETAMINE-DEXTROAMPHET ER 10 MG PO CP24: ORAL_CAPSULE | ORAL | 0 refills | Status: DC

## 2020-08-31 NOTE — Telephone Encounter (Signed)
Patient requested refill Epic LR: 07/29/2020 Pended Rx and sent to [facility] for approval. Shanda Bumps out of office)

## 2020-09-21 ENCOUNTER — Ambulatory Visit (INDEPENDENT_AMBULATORY_CARE_PROVIDER_SITE_OTHER): Payer: 59 | Admitting: *Deleted

## 2020-09-21 ENCOUNTER — Other Ambulatory Visit: Payer: Self-pay

## 2020-09-21 DIAGNOSIS — Z23 Encounter for immunization: Secondary | ICD-10-CM

## 2020-09-30 ENCOUNTER — Other Ambulatory Visit: Payer: Self-pay | Admitting: *Deleted

## 2020-09-30 DIAGNOSIS — F902 Attention-deficit hyperactivity disorder, combined type: Secondary | ICD-10-CM

## 2020-09-30 MED ORDER — AMPHETAMINE-DEXTROAMPHET ER 10 MG PO CP24: ORAL_CAPSULE | ORAL | 0 refills | Status: DC

## 2020-09-30 NOTE — Telephone Encounter (Signed)
Patient requested refill Contract on File Epic LR: 08/31/2020 Pended Rx and sent to [facility] for approval.

## 2020-10-08 ENCOUNTER — Other Ambulatory Visit: Payer: Self-pay | Admitting: Family

## 2020-10-08 DIAGNOSIS — F419 Anxiety disorder, unspecified: Secondary | ICD-10-CM

## 2020-10-08 NOTE — Telephone Encounter (Signed)
RX last filled on 08/25/2020

## 2020-11-01 ENCOUNTER — Other Ambulatory Visit: Payer: Self-pay | Admitting: *Deleted

## 2020-11-01 DIAGNOSIS — F902 Attention-deficit hyperactivity disorder, combined type: Secondary | ICD-10-CM

## 2020-11-01 MED ORDER — AMPHETAMINE-DEXTROAMPHET ER 10 MG PO CP24: ORAL_CAPSULE | ORAL | 0 refills | Status: DC

## 2020-11-01 NOTE — Telephone Encounter (Signed)
Patient requested refill Epic LR: 09/30/2020 Contract on file Pended Rx and sent to [facility] for approval.

## 2020-11-16 ENCOUNTER — Encounter: Payer: Self-pay | Admitting: Nurse Practitioner

## 2020-11-16 MED ORDER — LOVASTATIN 20 MG PO TABS
20.0000 mg | ORAL_TABLET | Freq: Every day | ORAL | 1 refills | Status: DC
Start: 2020-11-16 — End: 2020-12-06

## 2020-12-01 ENCOUNTER — Other Ambulatory Visit: Payer: 59

## 2020-12-01 ENCOUNTER — Other Ambulatory Visit: Payer: Self-pay

## 2020-12-01 ENCOUNTER — Other Ambulatory Visit: Payer: Self-pay | Admitting: *Deleted

## 2020-12-01 DIAGNOSIS — I10 Essential (primary) hypertension: Secondary | ICD-10-CM

## 2020-12-01 DIAGNOSIS — F902 Attention-deficit hyperactivity disorder, combined type: Secondary | ICD-10-CM

## 2020-12-01 DIAGNOSIS — R739 Hyperglycemia, unspecified: Secondary | ICD-10-CM

## 2020-12-01 DIAGNOSIS — E782 Mixed hyperlipidemia: Secondary | ICD-10-CM

## 2020-12-01 MED ORDER — AMPHETAMINE-DEXTROAMPHET ER 10 MG PO CP24: ORAL_CAPSULE | ORAL | 0 refills | Status: DC

## 2020-12-01 NOTE — Telephone Encounter (Signed)
Patient requested refill Epic LR: 11/01/2020 Contract on [employer] Rx and sent to [facility] for approval.

## 2020-12-02 LAB — COMPLETE METABOLIC PANEL WITH GFR
AG Ratio: 2.1 (calc) (ref 1.0–2.5)
ALT: 37 U/L (ref 9–46)
AST: 42 U/L — ABNORMAL HIGH (ref 10–35)
Albumin: 4.2 g/dL (ref 3.6–5.1)
Alkaline phosphatase (APISO): 61 U/L (ref 35–144)
BUN: 11 mg/dL (ref 7–25)
CO2: 27 mmol/L (ref 20–32)
Calcium: 8.6 mg/dL (ref 8.6–10.3)
Chloride: 105 mmol/L (ref 98–110)
Creat: 0.87 mg/dL (ref 0.70–1.25)
GFR, Est African American: 106 mL/min/{1.73_m2} (ref >=60)
GFR, Est Non African American: 92 mL/min/{1.73_m2} (ref >=60)
Globulin: 2 g/dL (calc) (ref 1.9–3.7)
Glucose, Bld: 89 mg/dL (ref 65–99)
Potassium: 4.4 mmol/L (ref 3.5–5.3)
Sodium: 141 mmol/L (ref 135–146)
Total Bilirubin: 0.5 mg/dL (ref 0.2–1.2)
Total Protein: 6.2 g/dL (ref 6.1–8.1)

## 2020-12-02 LAB — LIPID PANEL
Cholesterol: 147 mg/dL (ref <200)
HDL: 85 mg/dL (ref >=40)
LDL Cholesterol (Calc): 48 mg/dL (calc)
Non-HDL Cholesterol (Calc): 62 mg/dL (calc) (ref <130)
Total CHOL/HDL Ratio: 1.7 (calc) (ref <5.0)
Triglycerides: 56 mg/dL (ref <150)

## 2020-12-02 LAB — CBC WITH DIFFERENTIAL/PLATELET
Absolute Monocytes: 658 cells/uL (ref 200–950)
Basophils Absolute: 49 cells/uL (ref 0–200)
Basophils Relative: 0.7 %
Eosinophils Absolute: 595 cells/uL — ABNORMAL HIGH (ref 15–500)
Eosinophils Relative: 8.5 %
HCT: 44 % (ref 38.5–50.0)
Hemoglobin: 14.8 g/dL (ref 13.2–17.1)
Lymphs Abs: 2128 cells/uL (ref 850–3900)
MCH: 32.6 pg (ref 27.0–33.0)
MCHC: 33.6 g/dL (ref 32.0–36.0)
MCV: 96.9 fL (ref 80.0–100.0)
MPV: 10.3 fL (ref 7.5–12.5)
Monocytes Relative: 9.4 %
Neutro Abs: 3570 cells/uL (ref 1500–7800)
Neutrophils Relative %: 51 %
Platelets: 189 10*3/uL (ref 140–400)
RBC: 4.54 10*6/uL (ref 4.20–5.80)
RDW: 12.3 % (ref 11.0–15.0)
Total Lymphocyte: 30.4 %
WBC: 7 10*3/uL (ref 3.8–10.8)

## 2020-12-02 LAB — HEMOGLOBIN A1C
Hgb A1c MFr Bld: 5.3 % of total Hgb (ref <5.7)
Mean Plasma Glucose: 105 mg/dL
eAG (mmol/L): 5.8 mmol/L

## 2020-12-06 ENCOUNTER — Ambulatory Visit (INDEPENDENT_AMBULATORY_CARE_PROVIDER_SITE_OTHER): Payer: 59 | Admitting: Nurse Practitioner

## 2020-12-06 ENCOUNTER — Other Ambulatory Visit: Payer: Self-pay

## 2020-12-06 ENCOUNTER — Encounter: Payer: Self-pay | Admitting: Nurse Practitioner

## 2020-12-06 VITALS — BP 142/88 | HR 78 | Temp 96.9°F | Ht 70.5 in | Wt 192.0 lb

## 2020-12-06 DIAGNOSIS — K580 Irritable bowel syndrome with diarrhea: Secondary | ICD-10-CM

## 2020-12-06 DIAGNOSIS — M720 Palmar fascial fibromatosis [Dupuytren]: Secondary | ICD-10-CM | POA: Diagnosis not present

## 2020-12-06 DIAGNOSIS — Z9109 Other allergy status, other than to drugs and biological substances: Secondary | ICD-10-CM

## 2020-12-06 DIAGNOSIS — F419 Anxiety disorder, unspecified: Secondary | ICD-10-CM

## 2020-12-06 DIAGNOSIS — E785 Hyperlipidemia, unspecified: Secondary | ICD-10-CM

## 2020-12-06 DIAGNOSIS — G44221 Chronic tension-type headache, intractable: Secondary | ICD-10-CM | POA: Diagnosis not present

## 2020-12-06 DIAGNOSIS — K219 Gastro-esophageal reflux disease without esophagitis: Secondary | ICD-10-CM

## 2020-12-06 DIAGNOSIS — I10 Essential (primary) hypertension: Secondary | ICD-10-CM | POA: Diagnosis not present

## 2020-12-06 DIAGNOSIS — L989 Disorder of the skin and subcutaneous tissue, unspecified: Secondary | ICD-10-CM

## 2020-12-06 MED ORDER — LOVASTATIN 20 MG PO TABS: 20.0000 mg | ORAL_TABLET | Freq: Every day | ORAL | 1 refills | Status: DC

## 2020-12-06 MED ORDER — OMEPRAZOLE 20 MG PO CPDR: 20.0000 mg | DELAYED_RELEASE_CAPSULE | Freq: Every day | ORAL | 3 refills | Status: DC

## 2020-12-06 MED ORDER — BUTALBITAL-APAP-CAFFEINE 50-325-40 MG PO TABS: ORAL_TABLET | ORAL | 0 refills | Status: DC

## 2020-12-06 NOTE — Progress Notes (Signed)
Careteam: Patient Care Team: Connor [name], Connor Mastrangelo K, NP as PCP - General (Geriatric Medicine) Connor [name], Connor E, MD as Consulting Physician (Otolaryngology) Connor [name], Burke, MD as Consulting Physician (General Surgery) Connor [name], Mark, MD as Consulting Physician (Ophthalmology) Connor [name], Connor T, MD as Consulting Physician (Gastroenterology) Connor [name], Matthew, MD as Consulting Physician (General Surgery) Nita SellsHall, John, MD (Dermatology) Connor [name], Robert, MD (Gastroenterology)  [facility]  Advanced Directive information Does Patient Have a Medical Advance Directive?: Yes, Type of Advance Directive: Healthcare Power of [place];Living will, Does patient want to make changes to medical advance directive?: No - Patient declined  Allergies  Allergen Reactions   Amitriptyline    Zolpidem Tartrate Other (See Comments)    Severe headache   Amoxicillin-Pot Clavulanate Rash   Propoxyphene Hcl Nausea Only    Chief Complaint  Patient presents with   Medical Management of Chronic Issues    Yearly follow-up. Discuss need for HIV screening, shingrix (2nd dose)and Covid booster. Patient c/o post nasal drip and cough (worse), lower back pain a couple of weeks ago, and examine skin spots of concerns. Discuss lovastatin.      HPI: Patient is a 64 y.o. male for routine follow up.  He has retired but wife is keeping him busy.  Changing insurance at the beginning of the year.   Blood pressure generally in the 130s/?  Reports his belly cramping this morning. Generally will go away with BM but sometimes will need the dicycolomine. This will improve it.   Anxiety- worse since things are changing. Has been walking.  Using clonazepam   Low back pain- doing a lot of maintence in the house. Carrying barn doors. Uses asper cream and aleve. Which has improved it.   Allergies are ongoing, using nasal rinse, zyrtec, Flonase but does not seem to help. Used benadryl at night to dry  him up.  Feels like there is something in the house.   GERD- uses tums and pepcid. Problem every other day Review of Systems:  Review of Systems  Constitutional: Negative for chills, fever and weight loss.  HENT: Positive for congestion. Negative for tinnitus.   Respiratory: Negative for cough, sputum production and shortness of breath.   Cardiovascular: Negative for chest pain, palpitations and leg swelling.  Gastrointestinal: Negative for abdominal pain, constipation, diarrhea and heartburn.  Genitourinary: Negative for dysuria, frequency and urgency.  Musculoskeletal: Negative for back pain, falls, joint pain and myalgias.  Skin: Negative.   Neurological: Negative for dizziness and headaches.  Endo/Heme/Allergies: Positive for environmental allergies.  Psychiatric/Behavioral: Negative for depression and memory loss. The patient does not have insomnia.   =  Past Medical History:  Diagnosis Date   Abdominal pain, left lower quadrant    Actinic keratosis    ADD (attention deficit disorder)    Alcohol abuse, unspecified    Anxiety    Anxiety state, unspecified    Arthritis    shoulders and hips   Atrial fibrillation (HCC)    Attention deficit disorder without mention of hyperactivity    Cervicalgia    Depressive disorder, not elsewhere classified    Eczema 11/2011   right hand   Edema    Encounter for long-term (current) use of other medications    Headache(784.0)    tension or sinus   High cholesterol    Inguinal hernia 11/2011   right   Inguinal hernia without mention of obstruction or gangrene, unilateral or unspecified, (not specified as recurrent)    Insomnia, unspecified  Internal hemorrhoids without mention of complication    Irritable bowel syndrome    Irritable bowel syndrome (IBS)    Lumbago    Major depressive disorder, recurrent episode, severe, without mention of psychotic behavior    Obesity, unspecified    Other and  unspecified hyperlipidemia    Other atopic dermatitis and related conditions    Other disorders of vitreous    Other seborrheic keratosis    Other specified visual disturbances    Pain in joint, pelvic region and thigh    Pain in joint, site unspecified    Pathologic fracture of vertebrae    Poisoning and toxic reactions caused by other specified animals and plants    Recent retinal detachment, partial, with single defect    Routine general medical examination at a health care facility    Sciatica    Sinus infection 11/2011   started antibiotic 12/18/2011 x 7 days; current cough   Special screening for malignant neoplasm of prostate    Tension headache    Type II or unspecified type diabetes mellitus without mention of complication, not stated as uncontrolled    Unspecified essential hypertension    Past Surgical History:  Procedure Laterality Date   APPENDECTOMY  1987   CATARACT EXTRACTION  2011   right eye   CATARACT EXTRACTION W/ INTRAOCULAR LENS IMPLANT Left 04/04/2017   Dr. Dawna Part   EYE SURGERY Right 09/13/2018   Dr.Hanes with [facility]    HEMORRHOID SURGERY  04/15/14   thrombosed int. Hemorrhoid. Dr. Violeta Gelinas   HERNIA REPAIR  05/03/2011   left   INGUINAL HERNIA REPAIR  12/28/2011   Procedure: HERNIA REPAIR INGUINAL ADULT;  Surgeon: Connor Loron, MD;  Location: [facility];  Service: General;  Laterality: Right;   NASAL POLYP SURGERY  01/05/2011   DR. [facility]    NASAL SINUS SURGERY  12/2010   removed anal warts  1976   Dr Terri Piedra    RETINAL DETACHMENT REPAIR W/ SCLERAL BUCKLE LE  07/22/2008   right eye; pars plana vitrectomy   TONSILLECTOMY AND ADENOIDECTOMY  1964   TYMPANOSTOMY TUBE PLACEMENT  June 2015   Dr. Salena Saner. [place]   Social History:   reports that he quit smoking about 23 years ago. His smoking use included cigarettes. He has never used smokeless tobacco. He reports current alcohol use. He reports that he does  not use drugs.  Family History  Problem Relation Age of Onset   Stroke Father    Cancer Brother        prostate   Cancer Paternal Grandfather        colon    Medications: Patient's Medications  New Prescriptions   No medications on file  Previous Medications   AMPHETAMINE-DEXTROAMPHETAMINE (ADDERALL XR) 10 MG 24 HR CAPSULE    Take one tablet once a day for ADD   BUTALBITAL-ACETAMINOPHEN-CAFFEINE (FIORICET) 50-325-40 MG TABLET    Take one tablet daily as needed for headache   CETIRIZINE (ZYRTEC) 10 MG TABLET    Take 10 mg by mouth daily.   CLONAZEPAM (KLONOPIN) 0.5 MG TABLET    TAKE 1 TABLET BY MOUTH EVERY DAY AS NEEDED FOR ANXIETY   DICYCLOMINE (BENTYL) 20 MG TABLET    TAKE 1 TABLET EVERY 6 HOURS AS NEEDED TO CONTROL PAIN FROM IRRITABLE BOWEL SYNDROME   DORZOLAMIDEL-TIMOLOL (COSOPT) 22.3-6.8 MG/ML SOLN OPHTHALMIC SOLUTION    Place 1 drop into the right eye daily.   FAMOTIDINE (PEPCID) 20 MG TABLET  Take 20 mg by mouth as needed for heartburn or indigestion.   FLUTICASONE (FLONASE) 50 MCG/ACT NASAL SPRAY    Place 2 sprays into the nose daily.    HALOBETASOL (ULTRAVATE) 0.05 % CREAM    APPLY EXTERNALLY TO RASH UP TO THREE TIMES DAILY   LOVASTATIN (MEVACOR) 20 MG TABLET    Take 1 tablet (20 mg total) by mouth at bedtime.   MULTIPLE VITAMIN (MULTIVITAMIN) TABLET    Take 1 tablet by mouth daily.  Modified Medications   No medications on file  Discontinued Medications   CIPROFLOXACIN-DEXAMETHASONE (CIPRODEX) OTIC SUSPENSION    Place 4 drops into the right ear 2 (two) times daily.    Physical Exam:  Vitals:   12/06/20 0938  BP: (!) 142/88  Pulse: 78  Temp: (!) 96.9 F (36.1 C)  TempSrc: Temporal  SpO2: 98%  Weight: 192 lb (87.1 kg)  Height: 5' 10.5" (1.791 m)   Body mass index is 27.16 kg/m. Wt Readings from Last 3 Encounters:  12/06/20 192 lb (87.1 kg)  12/05/19 198 lb (89.8 kg)  05/30/19 192 lb 3.2 oz (87.2 kg)    Physical Exam Constitutional:      General: He  is not in acute distress.    Appearance: He is well-developed and well-nourished. He is not diaphoretic.  HENT:     Head: Normocephalic and atraumatic.     Mouth/Throat:     Mouth: Oropharynx is clear and moist.     Pharynx: No oropharyngeal exudate.  Eyes:     Extraocular Movements: EOM normal.     Conjunctiva/sclera: Conjunctivae normal.     Pupils: Pupils are equal, round, and reactive to light.  Cardiovascular:     Rate and Rhythm: Normal rate and regular rhythm.     Heart sounds: Normal heart sounds.  Pulmonary:     Effort: Pulmonary effort is normal.     Breath sounds: Normal breath sounds.  Abdominal:     General: Bowel sounds are normal.     Palpations: Abdomen is soft.  Musculoskeletal:        General: No tenderness or edema.     Cervical back: Normal range of motion and neck supple.  Skin:    General: Skin is warm and dry.     Findings: Lesion (small raised lesion to right inner arm) present.  Neurological:     Mental Status: He is alert and oriented to person, place, and time.  Psychiatric:        Mood and Affect: Mood and affect and mood normal.        Behavior: Behavior normal.     Labs reviewed: Basic Metabolic Panel: Recent Labs    12/01/20 0806  NA 141  [name] 4.4  CL 105  CO2 27  GLUCOSE 89  BUN 11  CREATININE 0.87  CALCIUM 8.6   Liver Function Tests: Recent Labs    12/01/20 0806  AST 42*  ALT 37  BILITOT 0.5  PROT 6.2   No results for input(s): LIPASE, AMYLASE in the last 8760 hours. No results for input(s): AMMONIA in the last 8760 hours. CBC: Recent Labs    12/01/20 0806  WBC 7.0  NEUTROABS 3,570  HGB 14.8  HCT 44.0  MCV 96.9  PLT 189   Lipid Panel: Recent Labs    12/01/20 0806  CHOL 147  HDL 85  LDLCALC 48  TRIG 56  CHOLHDL 1.7   TSH: No results for input(s): TSH in the last 8760 hours.  A1C: Lab Results  Component Value Date   HGBA1C 5.3 12/01/2020     Assessment/Plan 1. Chronic tension-type headache,  intractable -controlled, uses fioricet rarely if needed - butalbital-acetaminophen-caffeine (FIORICET) 50-325-40 MG tablet; Take one tablet daily as needed for headache  Dispense: 90 tablet; Refill: 0  2. Benign essential HTN -elevated in office however home bps controlled. Continues with dietary modification.   3. Dupuytren's contracture of both hands -stable at this time, no worsen of symptoms.  4. Hyperlipidemia, unspecified hyperlipidemia type -LDL at goal. Continue on mevacor with dietary modifications.  - lovastatin (MEVACOR) 20 MG tablet; Take 1 tablet (20 mg total) by mouth at bedtime.  Dispense: 90 tablet; Refill: 1  5. Skin lesion - Ambulatory referral to Dermatology for evaluation and treatment.  6. Environmental allergies Ongoing, continues on zyrtec (has also tried xyzal), Flonase and nasal spray. Discussed allergist referral but he would like to hold off at this time.   7. Gastroesophageal reflux disease without esophagitis -worsening symptoms. Will start omeprazole. Dietary modifications also encourage.d  - omeprazole (PRILOSEC) 20 MG capsule; Take 1 capsule (20 mg total) by mouth daily.  Dispense: 30 capsule; Refill: 3  8. Anxiety Stable, continues on klonopin as needed for anxiety.  9. Irritable bowel syndrome with diarrhea Ongoing, stable with dicyclomine 20 mg q 6 hours PRN  Next appt: 6 months.  Janene Harvey. Biagio Borg  [facility] [phone]

## 2021-01-03 ENCOUNTER — Other Ambulatory Visit: Payer: Self-pay | Admitting: *Deleted

## 2021-01-03 DIAGNOSIS — F902 Attention-deficit hyperactivity disorder, combined type: Secondary | ICD-10-CM

## 2021-01-03 MED ORDER — AMPHETAMINE-DEXTROAMPHET ER 10 MG PO CP24: ORAL_CAPSULE | ORAL | 0 refills | Status: DC

## 2021-01-03 NOTE — Telephone Encounter (Signed)
Patient requested refill Epic LR: 12/01/2020 Pended Rx and sent to [facility] for approval.

## 2021-02-02 ENCOUNTER — Other Ambulatory Visit: Payer: Self-pay | Admitting: *Deleted

## 2021-02-02 DIAGNOSIS — F902 Attention-deficit hyperactivity disorder, combined type: Secondary | ICD-10-CM

## 2021-02-02 MED ORDER — AMPHETAMINE-DEXTROAMPHET ER 10 MG PO CP24: ORAL_CAPSULE | ORAL | 0 refills | Status: DC

## 2021-02-02 NOTE — Telephone Encounter (Signed)
Patient called requesting refill on medication.  Epic LR: 01/03/2021. Pended Rx and sent to [facility] for approval.

## 2021-02-17 ENCOUNTER — Other Ambulatory Visit: Payer: Self-pay | Admitting: Nurse Practitioner

## 2021-02-17 DIAGNOSIS — L309 Dermatitis, unspecified: Secondary | ICD-10-CM

## 2021-02-17 DIAGNOSIS — F419 Anxiety disorder, unspecified: Secondary | ICD-10-CM

## 2021-02-17 NOTE — Telephone Encounter (Signed)
Pharmacy requested refill.  Epic LR: 10/11/2020 Contract on [employer] Rx and sent to [place] for approval.

## 2021-03-07 ENCOUNTER — Other Ambulatory Visit: Payer: Self-pay | Admitting: Nurse Practitioner

## 2021-03-07 DIAGNOSIS — F902 Attention-deficit hyperactivity disorder, combined type: Secondary | ICD-10-CM

## 2021-03-07 MED ORDER — AMPHETAMINE-DEXTROAMPHET ER 10 MG PO CP24
ORAL_CAPSULE | ORAL | 0 refills | Status: DC
Start: 2021-03-07 — End: 2021-04-13

## 2021-03-07 NOTE — Telephone Encounter (Signed)
Patient called requesting refill Epic LR: 02/02/2021 Pended Rx and sent to [facility] for approval.

## 2021-04-13 ENCOUNTER — Other Ambulatory Visit: Payer: Self-pay | Admitting: *Deleted

## 2021-04-13 DIAGNOSIS — F902 Attention-deficit hyperactivity disorder, combined type: Secondary | ICD-10-CM

## 2021-04-13 MED ORDER — AMPHETAMINE-DEXTROAMPHET ER 10 MG PO CP24: ORAL_CAPSULE | ORAL | 0 refills | Status: DC

## 2021-04-13 NOTE — Telephone Encounter (Signed)
Patient called and requested refill Epic LR: 03/07/2021 Contract needs to be updated, note added to upcoming appointment.  Pended Rx and sent to [facility] for approval.

## 2021-05-20 ENCOUNTER — Other Ambulatory Visit: Payer: Self-pay | Admitting: Nurse Practitioner

## 2021-05-20 DIAGNOSIS — F419 Anxiety disorder, unspecified: Secondary | ICD-10-CM

## 2021-05-20 NOTE — Telephone Encounter (Signed)
RX last refilled on 02/18/2020 #30, 1 refill  Treatment agreement on file from 12/06/2020

## 2021-05-27 ENCOUNTER — Other Ambulatory Visit: Payer: Self-pay | Admitting: *Deleted

## 2021-05-27 DIAGNOSIS — F902 Attention-deficit hyperactivity disorder, combined type: Secondary | ICD-10-CM

## 2021-05-27 MED ORDER — AMPHETAMINE-DEXTROAMPHET ER 10 MG PO CP24: ORAL_CAPSULE | ORAL | 0 refills | Status: DC

## 2021-05-27 NOTE — Telephone Encounter (Signed)
Patient requested refill Epic LR: 04/13/2021 Contract need updated, note added to upcoming appointment.  Pended Rx and sent to [facility] for approval.

## 2021-06-02 ENCOUNTER — Encounter: Payer: Self-pay | Admitting: Nurse Practitioner

## 2021-06-02 ENCOUNTER — Other Ambulatory Visit: Payer: Self-pay

## 2021-06-02 ENCOUNTER — Telehealth: Payer: Self-pay

## 2021-06-02 ENCOUNTER — Telehealth (INDEPENDENT_AMBULATORY_CARE_PROVIDER_SITE_OTHER): Payer: 59 | Admitting: Nurse Practitioner

## 2021-06-02 DIAGNOSIS — U071 COVID-19: Secondary | ICD-10-CM

## 2021-06-02 MED ORDER — NIRMATRELVIR/RITONAVIR (PAXLOVID)TABLET: 3.0000 | ORAL_TABLET | Freq: Two times a day (BID) | ORAL | 0 refills | Status: AC

## 2021-06-02 NOTE — Telephone Encounter (Signed)
Mr. Connor [name], Connor [name] are scheduled for a virtual visit with your provider today.    Just as we do with appointments in the office, we must obtain your consent to participate.  Your consent will be active for this visit and any virtual visit you may have with one of our providers in the next 365 days.    If you have a MyChart account, I can also send a copy of this consent to you electronically.  All virtual visits are billed to your insurance company just like a traditional visit in the office.  As this is a virtual visit, video technology does not allow for your provider to perform a traditional examination.  This may limit your provider's ability to fully assess your condition.  If your provider identifies any concerns that need to be evaluated in person or the need to arrange testing such as labs, EKG, etc, we will make arrangements to do so.    Although advances in technology are sophisticated, we cannot ensure that it will always work on either your end or our end.  If the connection with a video visit is poor, we may have to switch to a telephone visit.  With either a video or telephone visit, we are not always able to ensure that we have a secure connection.   I need to obtain your verbal consent now.   Are you willing to proceed with your visit today?   Connor [name] has provided verbal consent on 06/02/2021 for a virtual visit (video or telephone).   [name] [place], [place] 06/02/2021  9:00 AM

## 2021-06-02 NOTE — Progress Notes (Signed)
   This service is provided via telemedicine  No vital signs collected/recorded due to the encounter was a telemedicine visit.   Location of patient (ex: home, work):  Home  Patient consents to a telephone visit: Yes, see telephone visit dated 06/02/21  Location of the provider (ex: office, home):  [facility], Office   Name of any referring provider:  N/A  Names of all persons participating in the telemedicine service and their role in the encounter:  S.Chrae B/CMA, [name], NP, and Patient   Time spent on call:  7 min with medical assistant

## 2021-06-02 NOTE — Progress Notes (Signed)
Careteam: Patient Care Team: Sharon Seller, NP as PCP - General (Geriatric Medicine) Drema Halon, MD as Consulting Physician (Otolaryngology) Violeta Gelinas, MD as Consulting Physician (General Surgery) Jethro Bolus, MD as Consulting Physician (Ophthalmology) Meryl Dare, MD as Consulting Physician (Gastroenterology) Emelia Loron, MD as Consulting Physician (General Surgery) Nita Sells, MD (Dermatology) Webb Silversmith, MD (Gastroenterology)  Advanced Directive information    Allergies  Allergen Reactions   Amitriptyline    Zolpidem Tartrate Other (See Comments)    Severe headache   Amoxicillin-Pot Clavulanate Rash   Propoxyphene Hcl Nausea Only    Chief Complaint  Patient presents with   Acute Visit    Cough, congestion, and fever. Positive covid test on Wednesday, symptoms onset Tuesday. Visit completed via telephone/video visit      HPI: Patient is a 65 y.o. male with positive COVID. Symptoms started 2 days.  Headache, thick congestion, fever 102 last night 101 this morning. Sinus congestion and into throat.  Hard to get up and expel. Reports it has loosen up today.  He is taking zyrtec and tylenol for symptom management.  No shortness of breath, chest pains.  Drinking plenty of water.  Good appetite this morning.    Review of Systems:  Review of Systems  Constitutional:  Positive for chills, fever and malaise/fatigue. Negative for weight loss.  HENT:  Positive for congestion. Negative for sinus pain, sore throat and tinnitus.   Respiratory:  Positive for cough and sputum production. Negative for shortness of breath.   Cardiovascular:  Negative for chest pain, palpitations and leg swelling.  Gastrointestinal:  Negative for abdominal pain, constipation, diarrhea and heartburn.  Genitourinary:  Negative for dysuria, frequency and urgency.  Musculoskeletal:  Positive for myalgias. Negative for back pain, falls and joint pain.  Skin:  Negative.   Neurological:  Negative for dizziness and headaches.   Past Medical History:  Diagnosis Date   Abdominal pain, left lower quadrant    Actinic keratosis    ADD (attention deficit disorder)    Alcohol abuse, unspecified    Anxiety    Anxiety state, unspecified    Arthritis    shoulders and hips   Atrial fibrillation (HCC)    Attention deficit disorder without mention of hyperactivity    Cervicalgia    Depressive disorder, not elsewhere classified    Eczema 11/2011   right hand   Edema    Encounter for long-term (current) use of other medications    Headache(784.0)    tension or sinus   High cholesterol    Inguinal hernia 11/2011   right   Inguinal hernia without mention of obstruction or gangrene, unilateral or unspecified, (not specified as recurrent)    Insomnia, unspecified    Internal hemorrhoids without mention of complication    Irritable bowel syndrome    Irritable bowel syndrome (IBS)    Lumbago    Major depressive disorder, recurrent episode, severe, without mention of psychotic behavior    Obesity, unspecified    Other and unspecified hyperlipidemia    Other atopic dermatitis and related conditions    Other disorders of vitreous    Other seborrheic keratosis    Other specified visual disturbances    Pain in joint, pelvic region and thigh    Pain in joint, site unspecified    Pathologic fracture of vertebrae    Poisoning and toxic reactions caused by other specified animals and plants    Recent retinal detachment, partial, with single defect  Routine general medical examination at a health care facility    Sciatica    Sinus infection 11/2011   started antibiotic 12/18/2011 x 7 days; current cough   Special screening for malignant neoplasm of prostate    Tension headache    Type II or unspecified type diabetes mellitus without mention of complication, not stated as uncontrolled    Unspecified essential hypertension    Past Surgical History:   Procedure Laterality Date   APPENDECTOMY  1987   CATARACT EXTRACTION  2011   right eye   CATARACT EXTRACTION W/ INTRAOCULAR LENS IMPLANT Left 04/04/2017   Dr. Dawna Part   EYE SURGERY Right 09/13/2018   Dr.Hanes with [facility]    HEMORRHOID SURGERY  04/15/14   thrombosed int. Hemorrhoid. Dr. Violeta Gelinas   HERNIA REPAIR  05/03/2011   left   INGUINAL HERNIA REPAIR  12/28/2011   Procedure: HERNIA REPAIR INGUINAL ADULT;  Surgeon: Emelia Loron, MD;  Location: [facility];  Service: General;  Laterality: Right;   NASAL POLYP SURGERY  01/05/2011   DR. [facility]    NASAL SINUS SURGERY  12/2010   removed anal warts  1976   Dr Terri Piedra    RETINAL DETACHMENT REPAIR W/ SCLERAL BUCKLE LE  07/22/2008   right eye; pars plana vitrectomy   TONSILLECTOMY AND ADENOIDECTOMY  1964   TYMPANOSTOMY TUBE PLACEMENT  June 2015   Dr. Salena Saner. [place]   Social History:   reports that he quit smoking about 24 years ago. His smoking use included cigarettes. He has never used smokeless tobacco. He reports current alcohol use. He reports that he does not use drugs.  Family History  Problem Relation Age of Onset   Stroke Father    Cancer Brother        prostate   Cancer Paternal Grandfather        colon    Medications: Patient's Medications  New Prescriptions   No medications on file  Previous Medications   AMPHETAMINE-DEXTROAMPHETAMINE (ADDERALL XR) 10 MG 24 HR CAPSULE    Take one tablet once a day for ADD   BUTALBITAL-ACETAMINOPHEN-CAFFEINE (FIORICET) 50-325-40 MG TABLET    Take one tablet daily as needed for headache   CETIRIZINE (ZYRTEC) 10 MG TABLET    Take 10 mg by mouth daily.   CLONAZEPAM (KLONOPIN) 0.5 MG TABLET    TAKE 1 TABLET BY MOUTH EVERY DAY AS NEEDED FOR ANXIETY   DICYCLOMINE (BENTYL) 20 MG TABLET    TAKE 1 TABLET BY MOUTH EVERY 6 HOURS AS NEEDED FOR PAIN OR IRRITABLE BOWEL SYNDROME   DORZOLAMIDEL-TIMOLOL (COSOPT) 22.3-6.8 MG/ML SOLN OPHTHALMIC SOLUTION    Place 1 drop into the  right eye daily.   FAMOTIDINE (PEPCID) 20 MG TABLET    Take 20 mg by mouth as needed for heartburn or indigestion.   FLUTICASONE (FLONASE) 50 MCG/ACT NASAL SPRAY    Place 2 sprays into the nose daily.    HALOBETASOL (ULTRAVATE) 0.05 % CREAM    APPLY EXTERNALLY TO RASH UP TO THREE TIMES DAILY   LOVASTATIN (MEVACOR) 20 MG TABLET    Take 1 tablet (20 mg total) by mouth at bedtime.   MULTIPLE VITAMIN (MULTIVITAMIN) TABLET    Take 1 tablet by mouth daily.  Modified Medications   No medications on file  Discontinued Medications   OMEPRAZOLE (PRILOSEC) 20 MG CAPSULE    Take 1 capsule (20 mg total) by mouth daily.    Physical Exam:  There were no vitals filed for this  visit. There is no height or weight on file to calculate BMI. Wt Readings from Last 3 Encounters:  12/06/20 192 lb (87.1 kg)  12/05/19 198 lb (89.8 kg)  05/30/19 192 lb 3.2 oz (87.2 kg)   Limited PE due to virtual visit.  Physical Exam Constitutional:      Appearance: Normal appearance.  Neurological:     Mental Status: He is alert.    Labs reviewed: Basic Metabolic Panel: Recent Labs    12/01/20 0806  NA 141  K 4.4  CL 105  CO2 27  GLUCOSE 89  BUN 11  CREATININE 0.87  CALCIUM 8.6   Liver Function Tests: Recent Labs    12/01/20 0806  AST 42*  ALT 37  BILITOT 0.5  PROT 6.2   No results for input(s): LIPASE, AMYLASE in the last 8760 hours. No results for input(s): AMMONIA in the last 8760 hours. CBC: Recent Labs    12/01/20 0806  WBC 7.0  NEUTROABS 3,570  HGB 14.8  HCT 44.0  MCV 96.9  PLT 189   Lipid Panel: Recent Labs    12/01/20 0806  CHOL 147  HDL 85  LDLCALC 48  TRIG 56  CHOLHDL 1.7   TSH: No results for input(s): TSH in the last 8760 hours. A1C: Lab Results  Component Value Date   HGBA1C 5.3 12/01/2020     Assessment/Plan 1. COVID-19 -make sure you are staying well hydrated -recommended to take Vit C 1000 mg twice daily, Vit D 5000 units daily and zinc 50 mg daily for 7  days -mucinex DM twice daily as needed for chest congestion -due to persist fever, fatigue and malaise will treat with paxlovid at this time. He is aware this is for EUA only at at this time and would like to take.  - nirmatrelvir/ritonavir EUA (PAXLOVID) TABS; Take 3 tablets by mouth 2 (two) times daily for 5 days. (Take nirmatrelvir 150 mg two tablets twice daily for 5 days and ritonavir 100 mg one tablet twice daily for 5 days) Patient GFR is 92  Dispense: 30 tablet; Refill: 0  Next appt: 06/08/2021 as scheduled strict follow up precautions given for worsening of symptoms.  Janene Harvey. Biagio Borg  [facility] [phone]    Virtual Visit via mychart video  I connected with patient on 06/02/21 at  9:00 AM EDT by video and verified that I am speaking with the correct person using two identifiers.  Location: Patient: home Provider: [place]   I discussed the limitations, risks, security and privacy concerns of performing an evaluation and management service by telephone and the availability of in person appointments. I also discussed with the patient that there may be a patient responsible charge related to this service. The patient expressed understanding and agreed to proceed.   I discussed the assessment and treatment plan with the patient. The patient was provided an opportunity to ask questions and all were answered. The patient agreed with the plan and demonstrated an understanding of the instructions.   The patient was advised to call back or seek an in-person evaluation if the symptoms worsen or if the condition fails to improve as anticipated.  I provided 18 minutes of non-face-to-face time during this encounter.  Janene Harvey. Biagio Borg Av[facility] printed and mailed

## 2021-06-08 ENCOUNTER — Encounter: Payer: Self-pay | Admitting: Nurse Practitioner

## 2021-06-08 ENCOUNTER — Other Ambulatory Visit: Payer: Self-pay

## 2021-06-08 ENCOUNTER — Ambulatory Visit (INDEPENDENT_AMBULATORY_CARE_PROVIDER_SITE_OTHER): Payer: 59 | Admitting: Nurse Practitioner

## 2021-06-08 VITALS — BP 160/100 | HR 77 | Temp 96.8°F | Ht 70.5 in | Wt 194.0 lb

## 2021-06-08 DIAGNOSIS — G44221 Chronic tension-type headache, intractable: Secondary | ICD-10-CM

## 2021-06-08 DIAGNOSIS — K580 Irritable bowel syndrome with diarrhea: Secondary | ICD-10-CM

## 2021-06-08 DIAGNOSIS — I10 Essential (primary) hypertension: Secondary | ICD-10-CM

## 2021-06-08 DIAGNOSIS — K219 Gastro-esophageal reflux disease without esophagitis: Secondary | ICD-10-CM

## 2021-06-08 DIAGNOSIS — Z79899 Other long term (current) drug therapy: Secondary | ICD-10-CM

## 2021-06-08 DIAGNOSIS — U071 COVID-19: Secondary | ICD-10-CM | POA: Diagnosis not present

## 2021-06-08 DIAGNOSIS — Z9109 Other allergy status, other than to drugs and biological substances: Secondary | ICD-10-CM

## 2021-06-08 DIAGNOSIS — R002 Palpitations: Secondary | ICD-10-CM

## 2021-06-08 DIAGNOSIS — E785 Hyperlipidemia, unspecified: Secondary | ICD-10-CM

## 2021-06-08 DIAGNOSIS — H60501 Unspecified acute noninfective otitis externa, right ear: Secondary | ICD-10-CM

## 2021-06-08 DIAGNOSIS — F419 Anxiety disorder, unspecified: Secondary | ICD-10-CM

## 2021-06-08 MED ORDER — SERTRALINE HCL 50 MG PO TABS: ORAL_TABLET | ORAL | 3 refills | Status: DC

## 2021-06-08 MED ORDER — CIPROFLOXACIN-DEXAMETHASONE 0.3-0.1 % OT SUSP: 4.0000 [drp] | Freq: Two times a day (BID) | OTIC | 0 refills | Status: DC

## 2021-06-08 MED ORDER — LOSARTAN POTASSIUM 25 MG PO TABS: 25.0000 mg | ORAL_TABLET | Freq: Every day | ORAL | 1 refills | Status: DC

## 2021-06-08 NOTE — Progress Notes (Signed)
Careteam: Patient Care Team: Sharon Seller, NP as PCP - General (Geriatric Medicine) Drema Halon, MD as Consulting Physician (Otolaryngology) Violeta Gelinas, MD as Consulting Physician (General Surgery) Jethro Bolus, MD as Consulting Physician (Ophthalmology) Meryl Dare, MD as Consulting Physician (Gastroenterology) Emelia Loron, MD as Consulting Physician (General Surgery) Nita Sells, MD (Dermatology) Webb Silversmith, MD (Gastroenterology)  PLACE OF SERVICE:  [facility]  Advanced Directive information Does Patient Have a Medical Advance Directive?: Yes, Type of Advance Directive: Healthcare Power of [place];Living will;Out of facility DNR (pink MOST or yellow form), Pre-existing out of facility DNR order (yellow form or pink MOST form): Yellow form placed in chart (order not valid for inpatient use), Does patient want to make changes to medical advance directive?: No - Patient declined  Allergies  Allergen Reactions   Amitriptyline    Zolpidem Tartrate Other (See Comments)    Severe headache   Amoxicillin-Pot Clavulanate Rash   Propoxyphene Hcl Nausea Only    Chief Complaint  Patient presents with   Medical Management of Chronic Issues    6 month follow up and contract update.Patient states heart palpitations increased in frequency last month. Patient states that spot on arm is coming back that was frozen off by dermatologist.   Health Maintenance    2nd COVID booster, HIV screening     HPI: Patient is a 65 y.o. male for routine follow up.  Had COVID last week- took paxlovid- had nausea and diarrhea. Had fever for 5 days. Doing much better now, still with weakness.   Palpitations- happened when his wife was told she might have cancer- has been more frequent and skipping beat.   Hyperlipidemia- continues on lovastatin LDL at goal in December.   ADD- stable on addreall daily  Htn- diet controlled generally, stressful in the past month.    GERD- controlled on pepcid  Allergies- using zyrtec 10 mg PRN  IBS with Diarrhea- using dicyclomine PRN excerebration.   Migraines- PRN fioricet- have been stable.   Anxiety- well controlled on klonopin, no adverse effects   Review of Systems:  Review of Systems  Constitutional:  Negative for chills, fever and weight loss.  HENT:  Negative for tinnitus.   Respiratory:  Negative for cough, sputum production and shortness of breath.   Cardiovascular:  Negative for chest pain, palpitations and leg swelling.  Gastrointestinal:  Negative for abdominal pain, constipation, diarrhea and heartburn.  Genitourinary:  Negative for dysuria, frequency and urgency.  Musculoskeletal:  Negative for back pain, falls, joint pain and myalgias.  Skin: Negative.   Neurological:  Negative for dizziness and headaches.  Psychiatric/Behavioral:  Negative for depression and memory loss. The patient does not have insomnia.    Past Medical History:  Diagnosis Date   Abdominal pain, left lower quadrant    Actinic keratosis    ADD (attention deficit disorder)    Alcohol abuse, unspecified    Anxiety    Anxiety state, unspecified    Arthritis    shoulders and hips   Atrial fibrillation (HCC)    Attention deficit disorder without mention of hyperactivity    Cervicalgia    COVID    Depressive disorder, not elsewhere classified    Eczema 11/25/2011   right hand   Edema    Encounter for long-term (current) use of other medications    Headache(784.0)    tension or sinus   High cholesterol    Inguinal hernia 11/25/2011   right   Inguinal hernia without  mention of obstruction or gangrene, unilateral or unspecified, (not specified as recurrent)    Insomnia, unspecified    Internal hemorrhoids without mention of complication    Irritable bowel syndrome    Irritable bowel syndrome (IBS)    Lumbago    Major depressive disorder, recurrent episode, severe, without mention of psychotic behavior     Obesity, unspecified    Other and unspecified hyperlipidemia    Other atopic dermatitis and related conditions    Other disorders of vitreous    Other seborrheic keratosis    Other specified visual disturbances    Pain in joint, pelvic region and thigh    Pain in joint, site unspecified    Pathologic fracture of vertebrae    Poisoning and toxic reactions caused by other specified animals and plants    Recent retinal detachment, partial, with single defect    Routine general medical examination at a health care facility    Sciatica    Sinus infection 11/25/2011   started antibiotic 12/18/2011 x 7 days; current cough   Special screening for malignant neoplasm of prostate    Tension headache    Type II or unspecified type diabetes mellitus without mention of complication, not stated as uncontrolled    Unspecified essential hypertension    Past Surgical History:  Procedure Laterality Date   APPENDECTOMY  1987   CATARACT EXTRACTION  2011   right eye   CATARACT EXTRACTION W/ INTRAOCULAR LENS IMPLANT Left 04/04/2017   Dr. Dawna PartShaprio   EYE SURGERY Right 09/13/2018   Dr.Hanes with [facility]    HEMORRHOID SURGERY  04/15/14   thrombosed int. Hemorrhoid. Dr. Violeta GelinasBurke Thompson   HERNIA REPAIR  05/03/2011   left   INGUINAL HERNIA REPAIR  12/28/2011   Procedure: HERNIA REPAIR INGUINAL ADULT;  Surgeon: Emelia LoronMatthew Wakefield, MD;  Location:  SURGERY CENTER;  Service: General;  Laterality: Right;   [facility]    NASAL SINUS SURGERY  12/2010   removed anal warts  1976   Dr Terri PiedraLupton    RETINAL DETACHMENT REPAIR W/ SCLERAL BUCKLE LE  07/22/2008   right eye; pars plana vitrectomy   TONSILLECTOMY AND ADENOIDECTOMY  1964   TYMPANOSTOMY TUBE PLACEMENT  June 2015   Dr. Salena Saner. [place]   Social History:   reports that he quit smoking about 24 years ago. His smoking use included cigarettes. He has never used smokeless tobacco. He reports current alcohol use. He reports  that he does not use drugs.  Family History  Problem Relation Age of Onset   Stroke Father    Cancer Brother        prostate   Cancer Paternal Grandfather        colon    Medications: Patient's Medications  New Prescriptions   SERTRALINE (ZOLOFT) 50 MG TABLET    Start 1/2 tablet daily for 2 weeks then increase to 1 tablet.  Previous Medications   AMPHETAMINE-DEXTROAMPHETAMINE (ADDERALL XR) 10 MG 24 HR CAPSULE    Take one tablet once a day for ADD   BUTALBITAL-ACETAMINOPHEN-CAFFEINE (FIORICET) 50-325-40 MG TABLET    Take one tablet daily as needed for headache   CETIRIZINE (ZYRTEC) 10 MG TABLET    Take 10 mg by mouth daily.   CLONAZEPAM (KLONOPIN) 0.5 MG TABLET    TAKE 1 TABLET BY MOUTH EVERY DAY AS NEEDED FOR ANXIETY   DICYCLOMINE (BENTYL) 20 MG TABLET    TAKE 1 TABLET BY MOUTH EVERY 6 HOURS  AS NEEDED FOR PAIN OR IRRITABLE BOWEL SYNDROME   DORZOLAMIDEL-TIMOLOL (COSOPT) 22.3-6.8 MG/ML SOLN OPHTHALMIC SOLUTION    Place 1 drop into the right eye daily.   FAMOTIDINE (PEPCID) 20 MG TABLET    Take 20 mg by mouth as needed for heartburn or indigestion.   FLUTICASONE (FLONASE) 50 MCG/ACT NASAL SPRAY    Place 2 sprays into the nose daily.    HALOBETASOL (ULTRAVATE) 0.05 % CREAM    APPLY EXTERNALLY TO RASH UP TO THREE TIMES DAILY   LOVASTATIN (MEVACOR) 20 MG TABLET    Take 1 tablet (20 mg total) by mouth at bedtime.   MULTIPLE VITAMIN (MULTIVITAMIN) TABLET    Take 1 tablet by mouth daily.  Modified Medications   No medications on file  Discontinued Medications   No medications on file    Physical Exam:  Vitals:   06/08/21 0840  BP: (!) 160/100  Pulse: 77  Temp: (!) 96.8 F (36 C)  TempSrc: Temporal  SpO2: 97%  Weight: 194 lb (88 kg)  Height: 5' 10.5" (1.791 m)   Body mass index is 27.44 kg/m. Wt Readings from Last 3 Encounters:  06/08/21 194 lb (88 kg)  12/06/20 192 lb (87.1 kg)  12/05/19 198 lb (89.8 kg)    Physical Exam Constitutional:      General: He is not in acute  distress.    Appearance: He is well-developed. He is not diaphoretic.  HENT:     Head: Normocephalic and atraumatic.     Right Ear: External ear normal.     Left Ear: External ear normal.     Mouth/Throat:     Pharynx: No oropharyngeal exudate.  Eyes:     Conjunctiva/sclera: Conjunctivae normal.     Pupils: Pupils are equal, round, and reactive to light.  Cardiovascular:     Rate and Rhythm: Normal rate and regular rhythm.     Heart sounds: Normal heart sounds.  Pulmonary:     Effort: Pulmonary effort is normal.     Breath sounds: Normal breath sounds.  Abdominal:     General: Bowel sounds are normal.     Palpations: Abdomen is soft.  Musculoskeletal:        General: No tenderness.     Cervical back: Normal range of motion and neck supple.     Right lower leg: No edema.     Left lower leg: No edema.  Skin:    General: Skin is warm and dry.  Neurological:     Mental Status: He is alert and oriented to person, place, and time.     Labs reviewed: Basic Metabolic Panel: Recent Labs    12/01/20 0806  NA 141  K 4.4  CL 105  CO2 27  GLUCOSE 89  BUN 11  CREATININE 0.87  CALCIUM 8.6   Liver Function Tests: Recent Labs    12/01/20 0806  AST 42*  ALT 37  BILITOT 0.5  PROT 6.2   No results for input(s): LIPASE, AMYLASE in the last 8760 hours. No results for input(s): AMMONIA in the last 8760 hours. CBC: Recent Labs    12/01/20 0806  WBC 7.0  NEUTROABS 3,570  HGB 14.8  HCT 44.0  MCV 96.9  PLT 189   Lipid Panel: Recent Labs    12/01/20 0806  CHOL 147  HDL 85  LDLCALC 48  TRIG 56  CHOLHDL 1.7   TSH: No results for input(s): TSH in the last 8760 hours. A1C: Lab Results  Component Value  Date   HGBA1C 5.3 12/01/2020     Assessment/Plan 1. COVID-19 Completed paxlovid. Symptoms have resolved.   2. Benign essential HTN -blood pressure continues to be elevated over the last 3 visit. He reports eating more based on what his wife is able due to her  recent diagnosis.  -will add losartan at this time -low sodium diet encouraged. - COMPLETE METABOLIC PANEL WITH GFR - CBC with Differential/Platelet - losartan (COZAAR) 25 MG tablet; Take 1 tablet (25 mg total) by mouth daily.  Dispense: 90 tablet; Refill: 1  3. Hyperlipidemia, unspecified hyperlipidemia type Continues on lovastatin - COMPLETE METABOLIC PANEL WITH GFR  4. Gastroesophageal reflux disease without esophagitis Stable on Pepcid  5. Anxiety Worse with his wife's recent diagnosis of cancer.  -will start zoloft at this time. Continues on Clonazepam daily PRN as well.  - sertraline (ZOLOFT) 50 MG tablet; Start 1/2 tablet daily for 2 weeks then increase to 1 tablet.  Dispense: 30 tablet; Refill: 3  6. Environmental allergies -continues on zyrtec  7. Irritable bowel syndrome with diarrhea -worse diarrhea with paxlovid but back to baseline at this time.  8. Chronic tension-type headache, intractable -controlled at this time  9. Palpitation Started around the time of wife;s diagnosis, hx of PVCs in the past - TSH - EKG 12-Lead- Normal SR without PVCs rate 63. Will follow up lab work today as well.   10. Acute otitis externa of right ear, unspecified type - ciprofloxacin-dexamethasone (CIPRODEX) OTIC suspension; Place 4 drops into the right ear 2 (two) times daily.  Dispense: 7.5 mL; Refill: 0   Next appt: 6 weeks for mood and bp Janene Harvey. Biagio Borg  [facility] [phone]

## 2021-06-08 NOTE — Patient Instructions (Signed)
Start zoloft 1/2 tablet x 2 weeks for anxiety  After 1 week start losartan 25 mg by mouth daily  If tolerating zoloft 25 mg for 2 weeks then increase to whole tablet (50 mg daily)

## 2021-06-09 LAB — COMPLETE METABOLIC PANEL WITH GFR
AG Ratio: 2 (calc) (ref 1.0–2.5)
ALT: 38 U/L (ref 9–46)
AST: 24 U/L (ref 10–35)
Albumin: 4.6 g/dL (ref 3.6–5.1)
Alkaline phosphatase (APISO): 59 U/L (ref 35–144)
BUN: 12 mg/dL (ref 7–25)
CO2: 29 mmol/L (ref 20–32)
Calcium: 9.6 mg/dL (ref 8.6–10.3)
Chloride: 100 mmol/L (ref 98–110)
Creat: 0.99 mg/dL (ref 0.70–1.25)
GFR, Est African American: 94 mL/min/{1.73_m2} (ref >=60)
GFR, Est Non African American: 81 mL/min/{1.73_m2} (ref >=60)
Globulin: 2.3 g/dL (calc) (ref 1.9–3.7)
Glucose, Bld: 103 mg/dL — ABNORMAL HIGH (ref 65–99)
Potassium: 4 mmol/L (ref 3.5–5.3)
Sodium: 138 mmol/L (ref 135–146)
Total Bilirubin: 0.5 mg/dL (ref 0.2–1.2)
Total Protein: 6.9 g/dL (ref 6.1–8.1)

## 2021-06-09 LAB — CBC WITH DIFFERENTIAL/PLATELET
Absolute Monocytes: 647 cells/uL (ref 200–950)
Basophils Absolute: 20 cells/uL (ref 0–200)
Basophils Relative: 0.3 %
Eosinophils Absolute: 317 cells/uL (ref 15–500)
Eosinophils Relative: 4.8 %
HCT: 43.5 % (ref 38.5–50.0)
Hemoglobin: 14.8 g/dL (ref 13.2–17.1)
Lymphs Abs: 1993 cells/uL (ref 850–3900)
MCH: 32.6 pg (ref 27.0–33.0)
MCHC: 34 g/dL (ref 32.0–36.0)
MCV: 95.8 fL (ref 80.0–100.0)
MPV: 10.3 fL (ref 7.5–12.5)
Monocytes Relative: 9.8 %
Neutro Abs: 3623 cells/uL (ref 1500–7800)
Neutrophils Relative %: 54.9 %
Platelets: 219 10*3/uL (ref 140–400)
RBC: 4.54 10*6/uL (ref 4.20–5.80)
RDW: 11.8 % (ref 11.0–15.0)
Total Lymphocyte: 30.2 %
WBC: 6.6 10*3/uL (ref 3.8–10.8)

## 2021-06-09 LAB — DRUG MONITOR, PANEL 1, W/CONF, URINE
Amphetamines: NEGATIVE ng/mL (ref <500)
Barbiturates: NEGATIVE ng/mL (ref <300)
Benzodiazepines: NEGATIVE ng/mL (ref <100)
Cocaine Metabolite: NEGATIVE ng/mL (ref <150)
Creatinine: 105.5 mg/dL
Marijuana Metabolite: NEGATIVE ng/mL (ref <20)
Methadone Metabolite: NEGATIVE ng/mL (ref <100)
Opiates: NEGATIVE ng/mL (ref <100)
Oxidant: NEGATIVE ug/mL
Oxycodone: NEGATIVE ng/mL (ref <100)
Phencyclidine: NEGATIVE ng/mL (ref <25)
pH: 7.1 (ref 4.5–9.0)

## 2021-06-09 LAB — DM TEMPLATE

## 2021-06-09 LAB — TSH: TSH: 1.41 mIU/L (ref 0.40–4.50)

## 2021-06-10 ENCOUNTER — Other Ambulatory Visit: Payer: Self-pay | Admitting: Nurse Practitioner

## 2021-06-10 DIAGNOSIS — E785 Hyperlipidemia, unspecified: Secondary | ICD-10-CM

## 2021-06-29 ENCOUNTER — Other Ambulatory Visit: Payer: Self-pay | Admitting: Orthopedic Surgery

## 2021-06-29 DIAGNOSIS — F419 Anxiety disorder, unspecified: Secondary | ICD-10-CM

## 2021-06-29 NOTE — Telephone Encounter (Signed)
Patient has request refill on medication "Clonazepam " . Medication was last refilled 05/20/2021. Medication pend and sent to PCP Janyth Contes Janene Harvey, NP for approval. Please Advise.

## 2021-07-08 ENCOUNTER — Other Ambulatory Visit: Payer: Self-pay | Admitting: *Deleted

## 2021-07-08 DIAGNOSIS — F902 Attention-deficit hyperactivity disorder, combined type: Secondary | ICD-10-CM

## 2021-07-08 MED ORDER — AMPHETAMINE-DEXTROAMPHET ER 10 MG PO CP24: ORAL_CAPSULE | ORAL | 0 refills | Status: DC

## 2021-07-08 NOTE — Telephone Encounter (Signed)
Patient requested refill. Stated that he has been at the hospital with his wife, she had to have surgery to remove cancer. Stated that it was in the bone and they had to remove part of her jaw.   Epic LR: 05/27/2021 Contract on [employer] [facility] and sent to [place] for approval.

## 2021-07-27 ENCOUNTER — Other Ambulatory Visit: Payer: Self-pay

## 2021-07-27 ENCOUNTER — Ambulatory Visit (INDEPENDENT_AMBULATORY_CARE_PROVIDER_SITE_OTHER): Payer: 59 | Admitting: Nurse Practitioner

## 2021-07-27 ENCOUNTER — Encounter: Payer: Self-pay | Admitting: Nurse Practitioner

## 2021-07-27 VITALS — BP 164/102 | HR 84 | Temp 96.9°F | Ht 71.0 in | Wt 193.8 lb

## 2021-07-27 DIAGNOSIS — I10 Essential (primary) hypertension: Secondary | ICD-10-CM | POA: Diagnosis not present

## 2021-07-27 DIAGNOSIS — G44221 Chronic tension-type headache, intractable: Secondary | ICD-10-CM

## 2021-07-27 DIAGNOSIS — S92351D Displaced fracture of fifth metatarsal bone, right foot, subsequent encounter for fracture with routine healing: Secondary | ICD-10-CM

## 2021-07-27 DIAGNOSIS — F419 Anxiety disorder, unspecified: Secondary | ICD-10-CM | POA: Diagnosis not present

## 2021-07-27 DIAGNOSIS — R002 Palpitations: Secondary | ICD-10-CM | POA: Diagnosis not present

## 2021-07-27 MED ORDER — BUTALBITAL-APAP-CAFFEINE 50-325-40 MG PO TABS
ORAL_TABLET | ORAL | 0 refills | Status: DC
Start: 2021-07-27 — End: 2022-04-13

## 2021-07-27 MED ORDER — LOSARTAN POTASSIUM 50 MG PO TABS: 50.0000 mg | ORAL_TABLET | Freq: Every day | ORAL | 0 refills | Status: DC

## 2021-07-27 NOTE — Patient Instructions (Addendum)
Increase losartan to 50 mg by mouth daily   Take first dose when you get home and then take 2 of the 25 mg tablets at bedtime.  Goal blood pressure <140/90 If blood pressure >160/90 call office

## 2021-07-27 NOTE — Progress Notes (Signed)
Careteam: Patient Care Team: Sharon Seller, NP as PCP - General (Geriatric Medicine) Drema Halon, MD as Consulting Physician (Otolaryngology) Violeta Gelinas, MD as Consulting Physician (General Surgery) Jethro Bolus, MD as Consulting Physician (Ophthalmology) Meryl Dare, MD as Consulting Physician (Gastroenterology) Emelia Loron, MD as Consulting Physician (General Surgery) Nita Sells, MD (Dermatology) Webb Silversmith, MD (Gastroenterology)  PLACE OF SERVICE:  [facility]  Advanced Directive information    Allergies  Allergen Reactions   Amitriptyline    Zolpidem Tartrate Other (See Comments)    Severe headache   Amoxicillin-Pot Clavulanate Rash   Propoxyphene Hcl Nausea Only    Chief Complaint  Patient presents with   Follow-up    6 week follow-up and discuss need for PCV, covid #4, and HIV screening or exclude. Patient stopped zoloft 7/10/202 due to headaches. Refill Fioricet. Moderate fall risk      HPI: Patient is a 65 y.o. male for follow up on anxiety and depression.  Stopped zoloft July 10th.  Wife had major surgery and now he is her caregiver. Wife with metastatic cancer.  He feels like he is doing ok at this time. Continues on clonazepam daily.   Larey Seat down the stairs and went to urgent care. 5th metatarsal fracture. Wearing boot now. Pain is controlled.   Htn- has taken losartan but blood pressure still in the 140s or over at office visits. Does not take at home. Denies headaches, chest pains or palpitations at this time.   Review of Systems:  Review of Systems  Constitutional:  Negative for chills, fever and weight loss.  HENT:  Negative for tinnitus.   Respiratory:  Negative for cough, sputum production and shortness of breath.   Cardiovascular:  Negative for chest pain, palpitations and leg swelling.  Gastrointestinal:  Negative for abdominal pain, constipation, diarrhea and heartburn.  Genitourinary:  Negative for dysuria,  frequency and urgency.  Musculoskeletal:  Negative for back pain, falls, joint pain and myalgias.  Skin: Negative.   Neurological:  Negative for dizziness and headaches.  Psychiatric/Behavioral:  Negative for depression and memory loss. The patient does not have insomnia.    Past Medical History:  Diagnosis Date   Abdominal pain, left lower quadrant    Actinic keratosis    ADD (attention deficit disorder)    Alcohol abuse, unspecified    Anxiety    Anxiety state, unspecified    Arthritis    shoulders and hips   Atrial fibrillation (HCC)    Attention deficit disorder without mention of hyperactivity    Cervicalgia    COVID    Depressive disorder, not elsewhere classified    Eczema 11/25/2011   right hand   Edema    Encounter for long-term (current) use of other medications    Headache(784.0)    tension or sinus   High cholesterol    Inguinal hernia 11/25/2011   right   Inguinal hernia without mention of obstruction or gangrene, unilateral or unspecified, (not specified as recurrent)    Insomnia, unspecified    Internal hemorrhoids without mention of complication    Irritable bowel syndrome    Irritable bowel syndrome (IBS)    Lumbago    Major depressive disorder, recurrent episode, severe, without mention of psychotic behavior    Obesity, unspecified    Other and unspecified hyperlipidemia    Other atopic dermatitis and related conditions    Other disorders of vitreous    Other seborrheic keratosis    Other specified visual disturbances  Pain in joint, pelvic region and thigh    Pain in joint, site unspecified    Pathologic fracture of vertebrae    Poisoning and toxic reactions caused by other specified animals and plants    Recent retinal detachment, partial, with single defect    Routine general medical examination at a health care facility    Sciatica    Sinus infection 11/25/2011   started antibiotic 12/18/2011 x 7 days; current cough   Special screening for  malignant neoplasm of prostate    Tension headache    Type II or unspecified type diabetes mellitus without mention of complication, not stated as uncontrolled    Unspecified essential hypertension    Past Surgical History:  Procedure Laterality Date   APPENDECTOMY  1987   CATARACT EXTRACTION  2011   right eye   CATARACT EXTRACTION W/ INTRAOCULAR LENS IMPLANT Left 04/04/2017   Dr. Dawna Part   EYE SURGERY Right 09/13/2018   Dr.Hanes with [facility]    HEMORRHOID SURGERY  04/15/14   thrombosed int. Hemorrhoid. Dr. Violeta Gelinas   HERNIA REPAIR  05/03/2011   left   INGUINAL HERNIA REPAIR  12/28/2011   Procedure: HERNIA REPAIR INGUINAL ADULT;  Surgeon: Emelia Loron, MD;  Location: [facility];  Service: General;  Laterality: Right;   NASAL POLYP SURGERY  01/05/2011   DR. [facility]    NASAL SINUS SURGERY  12/2010   removed anal warts  1976   Dr Terri Piedra    RETINAL DETACHMENT REPAIR W/ SCLERAL BUCKLE LE  07/22/2008   right eye; pars plana vitrectomy   TONSILLECTOMY AND ADENOIDECTOMY  1964   TYMPANOSTOMY TUBE PLACEMENT  June 2015   Dr. Salena Saner. [facility]   Social History:   reports that he quit smoking about 24 years ago. His smoking use included cigarettes. He has never used smokeless tobacco. He reports current alcohol use. He reports that he does not use drugs.  Family History  Problem Relation Age of Onset   Stroke Father    Cancer Brother        prostate   Cancer Paternal Grandfather        colon    Medications: Patient's Medications  New Prescriptions   No medications on file  Previous Medications   AMPHETAMINE-DEXTROAMPHETAMINE (ADDERALL XR) 10 MG 24 HR CAPSULE    Take one tablet once a day for ADD   BUTALBITAL-ACETAMINOPHEN-CAFFEINE (FIORICET) 50-325-40 MG TABLET    Take one tablet daily as needed for headache   CETIRIZINE (ZYRTEC) 10 MG TABLET    Take 10 mg by mouth as needed.   CLONAZEPAM (KLONOPIN) 0.5 MG TABLET    TAKE 1 TABLET BY MOUTH EVERY DAY AS NEEDED  FOR ANXIETY   DICYCLOMINE (BENTYL) 20 MG TABLET    TAKE 1 TABLET BY MOUTH EVERY 6 HOURS AS NEEDED FOR PAIN OR IRRITABLE BOWEL SYNDROME   DORZOLAMIDEL-TIMOLOL (COSOPT) 22.3-6.8 MG/ML SOLN OPHTHALMIC SOLUTION    Place 1 drop into the right eye daily.   FAMOTIDINE-CALCIUM CARBONATE-MAGNESIUM HYDROXIDE (PEPCID COMPLETE) 10-800-165 MG CHEWABLE TABLET    Chew 1 tablet by mouth daily as needed.   FLUTICASONE (FLONASE) 50 MCG/ACT NASAL SPRAY    Place 1-2 sprays into the nose daily.   HALOBETASOL (ULTRAVATE) 0.05 % CREAM    APPLY EXTERNALLY TO RASH UP TO THREE TIMES DAILY   LOSARTAN (COZAAR) 25 MG TABLET    Take 1 tablet (25 mg total) by mouth daily.   LOVASTATIN (MEVACOR) 20 MG TABLET  TAKE 1 TABLET(20 MG) BY MOUTH AT BEDTIME   MULTIPLE VITAMIN (MULTIVITAMIN) TABLET    Take 1 tablet by mouth daily.  Modified Medications   No medications on file  Discontinued Medications   CIPROFLOXACIN-DEXAMETHASONE (CIPRODEX) OTIC SUSPENSION    Place 4 drops into the right ear 2 (two) times daily.   FAMOTIDINE (PEPCID) 20 MG TABLET    Take 20 mg by mouth as needed for heartburn or indigestion.   SERTRALINE (ZOLOFT) 50 MG TABLET    Start 1/2 tablet daily for 2 weeks then increase to 1 tablet.    Physical Exam:  Vitals:   07/27/21 0856  BP: (!) 164/102  Pulse: 84  Temp: (!) 96.9 F (36.1 C)  TempSrc: Temporal  SpO2: 98%  Weight: 193 lb 12.8 oz (87.9 kg)  Height: 5\' 11"  (1.803 m)   Body mass index is 27.03 kg/m. Wt Readings from Last 3 Encounters:  07/27/21 193 lb 12.8 oz (87.9 kg)  06/08/21 194 lb (88 kg)  12/06/20 192 lb (87.1 kg)    Physical Exam Constitutional:      General: He is not in acute distress.    Appearance: He is well-developed. He is not diaphoretic.  HENT:     Head: Normocephalic and atraumatic.     Right Ear: External ear normal.     Left Ear: External ear normal.     Mouth/Throat:     Pharynx: No oropharyngeal exudate.  Eyes:     Conjunctiva/sclera: Conjunctivae normal.      Pupils: Pupils are equal, round, and reactive to light.  Cardiovascular:     Rate and Rhythm: Normal rate and regular rhythm.     Heart sounds: Normal heart sounds.  Pulmonary:     Effort: Pulmonary effort is normal.     Breath sounds: Normal breath sounds.  Abdominal:     General: Bowel sounds are normal.     Palpations: Abdomen is soft.  Musculoskeletal:        General: No tenderness.     Cervical back: Normal range of motion and neck supple.     Right lower leg: No edema.     Left lower leg: No edema.  Skin:    General: Skin is warm and dry.  Neurological:     Mental Status: He is alert and oriented to person, place, and time.  Psychiatric:        Mood and Affect: Mood normal.    Labs reviewed: Basic Metabolic Panel: Recent Labs    12/01/20 0806 06/08/21 0929  NA 141 138  K 4.4 4.0  CL 105 100  CO2 27 29  GLUCOSE 89 103*  BUN 11 12  CREATININE 0.87 0.99  CALCIUM 8.6 9.6  TSH  --  1.41   Liver Function Tests: Recent Labs    12/01/20 0806 06/08/21 0929  AST 42* 24  ALT 37 38  BILITOT 0.5 0.5  PROT 6.2 6.9   No results for input(s): LIPASE, AMYLASE in the last 8760 hours. No results for input(s): AMMONIA in the last 8760 hours. CBC: Recent Labs    12/01/20 0806 06/08/21 0929  WBC 7.0 6.6  NEUTROABS 3,570 3,623  HGB 14.8 14.8  HCT 44.0 43.5  MCV 96.9 95.8  PLT 189 219   Lipid Panel: Recent Labs    12/01/20 0806  CHOL 147  HDL 85  LDLCALC 48  TRIG 56  CHOLHDL 1.7   TSH: Recent Labs    06/08/21 0929  TSH 1.41  A1C: Lab Results  Component Value Date   HGBA1C 5.3 12/01/2020     Assessment/Plan 1. Chronic tension-type headache, intractable -stable. Improved off zoloft. Continue fioricet PRN - butalbital-acetaminophen-caffeine (FIORICET) 50-325-40 MG tablet; Take one tablet daily as needed for headache  Dispense: 90 tablet; Refill: 0  2. Benign essential HTN -not controlled. To increase losartan at this time. Low sodium  diet. -will monitor BP at home and notify if not at goal <140/90.  - losartan (COZAAR) 50 MG tablet; Take 1 tablet (50 mg total) by mouth daily.  Dispense: 90 tablet; Refill: 0 - BASIC METABOLIC PANEL WITH GFR  3. Anxiety Could not tolerate zoloft. Feeling better at this time. Continues on clonazepam daily  4. Palpitation -improved. Likely related to anxiety .  5. Closed fracture of fifth metatarsal bone of right foot with routine healing, physeal involvement unspecified, subsequent encounter Pain controlled, wearing fracture boot    Next appt: 4 months.  Janene Harvey. Biagio Borg  [facility] [phone]

## 2021-07-28 ENCOUNTER — Encounter: Payer: Self-pay | Admitting: Nurse Practitioner

## 2021-07-28 LAB — BASIC METABOLIC PANEL WITH GFR
BUN: 15 mg/dL (ref 7–25)
CO2: 24 mmol/L (ref 20–32)
Calcium: 9.2 mg/dL (ref 8.6–10.3)
Chloride: 101 mmol/L (ref 98–110)
Creat: 0.91 mg/dL (ref 0.70–1.35)
Glucose, Bld: 84 mg/dL (ref 65–139)
Potassium: 4.1 mmol/L (ref 3.5–5.3)
Sodium: 139 mmol/L (ref 135–146)
eGFR: 94 mL/min/{1.73_m2} (ref >=60)

## 2021-08-03 ENCOUNTER — Other Ambulatory Visit: Payer: Self-pay | Admitting: Nurse Practitioner

## 2021-08-03 DIAGNOSIS — F419 Anxiety disorder, unspecified: Secondary | ICD-10-CM

## 2021-08-03 NOTE — Telephone Encounter (Signed)
Refill request received. Contract up to date. Medication pended and sent to [name], NP for approval.

## 2021-08-06 ENCOUNTER — Telehealth: Payer: 59 | Admitting: Nurse Practitioner

## 2021-08-06 ENCOUNTER — Encounter: Payer: Self-pay | Admitting: Nurse Practitioner

## 2021-08-06 DIAGNOSIS — M5442 Lumbago with sciatica, left side: Secondary | ICD-10-CM

## 2021-08-06 DIAGNOSIS — M5441 Lumbago with sciatica, right side: Secondary | ICD-10-CM

## 2021-08-06 MED ORDER — NAPROXEN 500 MG PO TABS: 500.0000 mg | ORAL_TABLET | Freq: Two times a day (BID) | ORAL | 0 refills | Status: DC

## 2021-08-06 MED ORDER — CYCLOBENZAPRINE HCL 10 MG PO TABS: 10.0000 mg | ORAL_TABLET | Freq: Three times a day (TID) | ORAL | 0 refills | Status: DC | PRN

## 2021-08-06 NOTE — Progress Notes (Signed)
Virtual Visit Consent   Connor [name], you are scheduled for a virtual visit with Mary-Margaret Daphine Deutscher, FNP, a [facility] provider, today.     Just as with appointments in the office, your consent must be obtained to participate.  Your consent will be active for this visit and any virtual visit you may have with one of our providers in the next 365 days.     If you have a MyChart account, a copy of this consent can be sent to you electronically.  All virtual visits are billed to your insurance company just like a traditional visit in the office.    As this is a virtual visit, video technology does not allow for your provider to perform a traditional examination.  This may limit your provider's ability to fully assess your condition.  If your provider identifies any concerns that need to be evaluated in person or the need to arrange testing (such as labs, EKG, etc.), we will make arrangements to do so.     Although advances in technology are sophisticated, we cannot ensure that it will always work on either your end or our end.  If the connection with a video visit is poor, the visit may have to be switched to a telephone visit.  With either a video or telephone visit, we are not always able to ensure that we have a secure connection.     I need to obtain your verbal consent now.   Are you willing to proceed with your visit today? YES   Connor [name] has provided verbal consent on 08/06/2021 for a virtual visit (video or telephone).   Mary-Margaret Daphine Deutscher, FNP   Date: 08/06/2021 11:29 AM   Virtual Visit via Video Note   I, Mary-Margaret Daphine Deutscher, connected with Connor [name] ([id number], [birth year]) on 08/06/21 at 12:15 PM EDT by a video-enabled telemedicine application and verified that I am speaking with the correct person using two identifiers.  Location: Patient: Virtual Visit Location Patient: Home Provider: Virtual Visit Location Provider: Mobile   I discussed the limitations  of evaluation and management by telemedicine and the availability of in person appointments. The patient expressed understanding and agreed to proceed.    History of Present Illness: Connor [name] is a 65 y.o. who identifies as a male who was assigned male at birth, and is being seen today for back pain.  HPI: Stood up this morning from chair and he develped a sudden sharp low back pain. Rate [place] 3/10 when sitting but 710 when sitting or standing. Walking is difficult. Has burning sensation in bil thighs. He has iced it and  took some tylenol.that has not helped  Problems:  Patient Active Problem List   Diagnosis Date Noted   Dupuytren's contracture of both hands 05/30/2018   Biceps tendonitis 05/30/2017   Marital conflict 11/14/2016   Screening for prostate cancer 05/09/2016   Eczema 11/09/2015   Palpitation 05/12/2015   Hyperglycemia 05/12/2015   Diverticulosis of colon without hemorrhage 12/14/2014   Adjustment disorder with anxiety 07/13/2014   Adjustment insomnia 07/13/2014   Benign essential HTN 07/13/2014   Allergic rhinitis 07/13/2014   Chronic tension-type headache, intractable 07/13/2014   Thrombosed external hemorrhoid 04/15/2014   Pain in joint, ankle and foot 10/29/2013   Pain in joint, shoulder region 10/29/2013   Tympanic membrane disorder 10/29/2013   Hyperlipidemia 05/03/2013   ADD (attention deficit disorder)    Irritable bowel syndrome (IBS)    Lumbago  Allergies:  Allergies  Allergen Reactions   Amitriptyline    Zolpidem Tartrate Other (See Comments)    Severe headache   Amoxicillin-Pot Clavulanate Rash   Propoxyphene Hcl Nausea Only   Medications:  Current Outpatient Medications:    amphetamine-dextroamphetamine (ADDERALL XR) 10 MG 24 hr capsule, Take one tablet once a day for ADD, Disp: 30 capsule, Rfl: 0   butalbital-acetaminophen-caffeine (FIORICET) 50-325-40 MG tablet, Take one tablet daily as needed for headache, Disp: 90 tablet, Rfl: 0    cetirizine (ZYRTEC) 10 MG tablet, Take 10 mg by mouth as needed., Disp: , Rfl:    clonazePAM (KLONOPIN) 0.5 MG tablet, TAKE 1 TABLET BY MOUTH EVERY DAY AS NEEDED FOR ANXIETY, Disp: 30 tablet, Rfl: 0   dicyclomine (BENTYL) 20 MG tablet, TAKE 1 TABLET BY MOUTH EVERY 6 HOURS AS NEEDED FOR PAIN OR IRRITABLE BOWEL SYNDROME, Disp: 120 tablet, Rfl: 3   dorzolamidel-timolol (COSOPT) 22.3-6.8 MG/ML SOLN ophthalmic solution, Place 1 drop into the right eye daily., Disp: , Rfl:    famotidine-calcium carbonate-magnesium hydroxide (PEPCID COMPLETE) 10-800-165 MG chewable tablet, Chew 1 tablet by mouth daily as needed., Disp: , Rfl:    fluticasone (FLONASE) 50 MCG/ACT nasal spray, Place 1-2 sprays into the nose daily., Disp: , Rfl:    halobetasol (ULTRAVATE) 0.05 % cream, APPLY EXTERNALLY TO RASH UP TO THREE TIMES DAILY, Disp: 15 g, Rfl: 1   losartan (COZAAR) 50 MG tablet, Take 1 tablet (50 mg total) by mouth daily., Disp: 90 tablet, Rfl: 0   lovastatin (MEVACOR) 20 MG tablet, TAKE 1 TABLET(20 MG) BY MOUTH AT BEDTIME, Disp: 90 tablet, Rfl: 1   Multiple Vitamin (MULTIVITAMIN) tablet, Take 1 tablet by mouth daily., Disp: , Rfl:   Observations/Objective: Patient is well-developed, well-nourished in no acute distress.  Resting comfortably  at home.  Head is normocephalic, traumatic.  No labored breathing.  Speech is clear and coherent with logical content.  Patient is alert and oriented at baseline.  Leaning forward to attempt ot get up increases pain. Sitting very still during visit  Assessment and Plan:  Connor [name] in today with chief complaint of No chief complaint on file.   1. Acute midline low back pain with bilateral sciatica Ice today Heat tomorrow Rest  No lifting for 2 days Sedation precaution with flereril Meds ordered this encounter  Medications   naproxen (NAPROSYN) 500 MG tablet    Sig: Take 1 tablet (500 mg total) by mouth 2 (two) times daily with a meal.    Dispense:  60 tablet     Refill:  0    Order Specific Question:   Supervising Provider    Answer:   Hyacinth Meeker, BRIAN [[id number]]   cyclobenzaprine (FLEXERIL) 10 MG tablet    Sig: Take 1 tablet (10 mg total) by mouth 3 (three) times daily as needed for muscle spasms.    Dispense:  30 tablet    Refill:  0    Order Specific Question:   Supervising Provider    Answer:   Eber Hong [[id number]]      Follow Up Instructions: I discussed the assessment and treatment plan with the patient. The patient was provided an opportunity to ask questions and all were answered. The patient agreed with the plan and demonstrated an understanding of the instructions.  A copy of instructions were sent to the patient via MyChart.  The patient was advised to call back or seek an in-person evaluation if the symptoms worsen or if the condition fails  to improve as anticipated.  Time:  I spent 10 minutes with the patient via telehealth technology discussing the above problems/concerns.    Mary-Margaret Daphine Deutscher, FNP

## 2021-08-11 ENCOUNTER — Other Ambulatory Visit: Payer: Self-pay | Admitting: *Deleted

## 2021-08-11 DIAGNOSIS — F902 Attention-deficit hyperactivity disorder, combined type: Secondary | ICD-10-CM

## 2021-08-11 MED ORDER — AMPHETAMINE-DEXTROAMPHET ER 10 MG PO CP24: ORAL_CAPSULE | ORAL | 0 refills | Status: DC

## 2021-08-11 NOTE — Telephone Encounter (Signed)
Patient requested refill  Epic LR: 07/08/2021 Contract on [facility] and sent to [place] for approval.

## 2021-09-12 ENCOUNTER — Other Ambulatory Visit: Payer: Self-pay | Admitting: *Deleted

## 2021-09-12 DIAGNOSIS — F902 Attention-deficit hyperactivity disorder, combined type: Secondary | ICD-10-CM

## 2021-09-12 MED ORDER — AMPHETAMINE-DEXTROAMPHET ER 10 MG PO CP24: ORAL_CAPSULE | ORAL | 0 refills | Status: DC

## 2021-09-12 NOTE — Telephone Encounter (Signed)
Patient requested refill.  Epic LR: 08/11/2021 Contract on [facility] and sent to [place] for approval.

## 2021-09-20 ENCOUNTER — Other Ambulatory Visit: Payer: Self-pay | Admitting: Nurse Practitioner

## 2021-09-20 DIAGNOSIS — F419 Anxiety disorder, unspecified: Secondary | ICD-10-CM

## 2021-09-20 NOTE — Telephone Encounter (Signed)
Patient has request refill on medication "Klonopin". Patient last refill was 08/03/2021. Patient medication pend and sent to PCP Janyth Contes Janene Harvey, NP for approval. Please Advise.

## 2021-09-27 ENCOUNTER — Other Ambulatory Visit: Payer: Self-pay | Admitting: Nurse Practitioner

## 2021-10-12 ENCOUNTER — Other Ambulatory Visit: Payer: Self-pay

## 2021-10-12 DIAGNOSIS — F902 Attention-deficit hyperactivity disorder, combined type: Secondary | ICD-10-CM

## 2021-10-12 MED ORDER — AMPHETAMINE-DEXTROAMPHET ER 10 MG PO CP24: ORAL_CAPSULE | ORAL | 0 refills | Status: DC

## 2021-10-12 NOTE — Telephone Encounter (Signed)
Patient called requesting refill on Adderall. Patient last had medication filled 09/12/2021 and contract on file and up to date.  Medication pended and sent to Wilhelmenia Blase, NP, (covering provider for [name], NP,) for approval

## 2021-10-27 ENCOUNTER — Other Ambulatory Visit: Payer: Self-pay | Admitting: Nurse Practitioner

## 2021-10-27 DIAGNOSIS — I10 Essential (primary) hypertension: Secondary | ICD-10-CM

## 2021-10-30 ENCOUNTER — Encounter: Payer: Self-pay | Admitting: Nurse Practitioner

## 2021-11-14 ENCOUNTER — Other Ambulatory Visit: Payer: Self-pay

## 2021-11-14 DIAGNOSIS — F902 Attention-deficit hyperactivity disorder, combined type: Secondary | ICD-10-CM

## 2021-11-14 MED ORDER — AMPHETAMINE-DEXTROAMPHET ER 10 MG PO CP24: ORAL_CAPSULE | ORAL | 0 refills | Status: DC

## 2021-11-14 NOTE — Telephone Encounter (Signed)
Patient called wanting refill on Adderall XR 10mg . Medication last filled 10/12/2021 #30 refills 0. Non opiod contract up to date.   Message routed to 10/14/2021, NP for approval

## 2021-11-28 ENCOUNTER — Other Ambulatory Visit: Payer: Self-pay

## 2021-11-28 ENCOUNTER — Ambulatory Visit: Payer: 59 | Admitting: Nurse Practitioner

## 2021-11-28 ENCOUNTER — Encounter: Payer: Self-pay | Admitting: Nurse Practitioner

## 2021-11-28 VITALS — BP 142/94 | HR 85 | Temp 97.3°F | Ht 70.5 in | Wt 192.0 lb

## 2021-11-28 DIAGNOSIS — E782 Mixed hyperlipidemia: Secondary | ICD-10-CM

## 2021-11-28 DIAGNOSIS — G44221 Chronic tension-type headache, intractable: Secondary | ICD-10-CM

## 2021-11-28 DIAGNOSIS — M25571 Pain in right ankle and joints of right foot: Secondary | ICD-10-CM

## 2021-11-28 DIAGNOSIS — F419 Anxiety disorder, unspecified: Secondary | ICD-10-CM | POA: Diagnosis not present

## 2021-11-28 DIAGNOSIS — R109 Unspecified abdominal pain: Secondary | ICD-10-CM

## 2021-11-28 DIAGNOSIS — K219 Gastro-esophageal reflux disease without esophagitis: Secondary | ICD-10-CM

## 2021-11-28 DIAGNOSIS — R0683 Snoring: Secondary | ICD-10-CM

## 2021-11-28 DIAGNOSIS — K580 Irritable bowel syndrome with diarrhea: Secondary | ICD-10-CM

## 2021-11-28 DIAGNOSIS — I10 Essential (primary) hypertension: Secondary | ICD-10-CM

## 2021-11-28 NOTE — Progress Notes (Signed)
Careteam: Patient Care Team: Lauree Chandler, NP as PCP - General (Geriatric Medicine) Rozetta Nunnery, MD as Consulting Physician (Otolaryngology) Georganna Skeans, MD as Consulting Physician (General Surgery) Rutherford Guys, MD as Consulting Physician (Ophthalmology) Ladene Artist, MD as Consulting Physician (Gastroenterology) Rolm Bookbinder, MD as Consulting Physician (General Surgery) Allyn Kenner, MD (Dermatology) Nehemiah Settle, MD (Gastroenterology)  PLACE OF SERVICE:  [place] Directive information Does Patient Have a Medical Advance Directive?: Yes, Type of Advance Directive: [place];Living will, Does patient want to make changes to medical advance directive?: No - Patient declined  Allergies  Allergen Reactions   Amitriptyline    Zolpidem Tartrate Other (See Comments)    Severe headache   Amoxicillin-Pot Clavulanate Rash   Propoxyphene Hcl Nausea Only    Chief Complaint  Patient presents with   Medical Management of Chronic Issues    4 month follow-up  Patient c/o episodes of pain at the base of ribcage, last episode was Saturday. Pain usually last about 12 hours each episode. Patient c/o ongoing right foot concerns following injury from July (fell dow stairs). Discuss need for HIV screening and pneu vaccine or postpone/exclude.     HPI: Patient is a 65 y.o. male for routine follow up.   Reports when he sleeps he wakes up tired.  He snores at night. Occasionally loudly  Does not have hx of OSA. Does not wish to be screened.   Reports his ankle and leg still bothering him after fracture from July. Not doing a lot of walking.   Htn- takes medication at night before bed.   Wife has had 30 radiation and 6 chemo in 6 weeks. The effects has continue to get worse for weeks. She has feeding tub. Follow up scans in January will be monitoring closely. He had to call 911 at one point and she had to go to the hospital.    IBS- overall controlled.   Some sinus headaches.  Migraines stable.   Had episode were upper abdomen was having discomfort.  He had chills and sweats with mild fever. Has nausea with it. Wife also had not felt well during this time so was questioning bug.   Then went away. A month later came back but only with discomfort He reports he was pushing around but no tender spots. Did not last as long. No diarrhea, nausea or vomiting.  Aware of the discomfort but not as bad as diverticulitis.  Had a lot of gas and belching with GERD.  Took pepcid complete and gas x which helped.    Review of Systems:  Review of Systems  Constitutional:  Negative for chills, fever and weight loss.  HENT:  Negative for tinnitus.   Respiratory:  Negative for cough, sputum production and shortness of breath.   Cardiovascular:  Negative for chest pain, palpitations and leg swelling.  Gastrointestinal:  Negative for abdominal pain, constipation, diarrhea and heartburn.  Genitourinary:  Negative for dysuria, frequency and urgency.  Musculoskeletal:  Positive for falls and joint pain. Negative for back pain and myalgias.  Skin: Negative.   Neurological:  Negative for dizziness and headaches.  Psychiatric/Behavioral:  Negative for depression and memory loss. The patient is nervous/anxious. The patient does not have insomnia.    Past Medical History:  Diagnosis Date   Abdominal pain, left lower quadrant    Actinic keratosis    ADD (attention deficit disorder)    Alcohol abuse, unspecified    Anxiety  Anxiety state, unspecified    Arthritis    shoulders and hips   Atrial fibrillation (HCC)    Attention deficit disorder without mention of hyperactivity    Cervicalgia    COVID    Depressive disorder, not elsewhere classified    Eczema 11/25/2011   right hand   Edema    Encounter for long-term (current) use of other medications    Headache(784.0)    tension or sinus   High cholesterol    Inguinal  hernia 11/25/2011   right   Inguinal hernia without mention of obstruction or gangrene, unilateral or unspecified, (not specified as recurrent)    Insomnia, unspecified    Internal hemorrhoids without mention of complication    Irritable bowel syndrome    Irritable bowel syndrome (IBS)    Lumbago    Major depressive disorder, recurrent episode, severe, without mention of psychotic behavior    Obesity, unspecified    Other and unspecified hyperlipidemia    Other atopic dermatitis and related conditions    Other disorders of vitreous    Other seborrheic keratosis    Other specified visual disturbances    Pain in joint, pelvic region and thigh    Pain in joint, site unspecified    Pathologic fracture of vertebrae    Poisoning and toxic reactions caused by other specified animals and plants    Recent retinal detachment, partial, with single defect    Routine general medical examination at a health care facility    Sciatica    Sinus infection 11/25/2011   started antibiotic 12/18/2011 x 7 days; current cough   Special screening for malignant neoplasm of prostate    Tension headache    Type II or unspecified type diabetes mellitus without mention of complication, not stated as uncontrolled    Unspecified essential hypertension    Past Surgical History:  Procedure Laterality Date   [place]   CATARACT EXTRACTION  2011   right eye   CATARACT EXTRACTION W/ INTRAOCULAR LENS IMPLANT Left 04/04/2017   Dr. Richardean Sale   EYE SURGERY Right 09/13/2018   Dr.Hanes with [facility]  04/15/14   thrombosed int. Hemorrhoid. Dr. Georganna Skeans   HERNIA REPAIR  05/03/2011   left   INGUINAL HERNIA REPAIR  12/28/2011   Procedure: HERNIA REPAIR INGUINAL ADULT;  Surgeon: Rolm Bookbinder, MD;  Location: [place];  Service: General;  Laterality: Right;   NASAL POLYP SURGERY  01/05/2011   DR. [facility]    NASAL SINUS SURGERY  12/2010   removed anal warts   1976   Dr Allyson Sabal    RETINAL DETACHMENT REPAIR W/ SCLERAL BUCKLE LE  07/22/2008   right eye; pars plana vitrectomy   TONSILLECTOMY AND ADENOIDECTOMY  1964   TYMPANOSTOMY TUBE PLACEMENT  June 2015   Dr. Loletha Grayer. [place]   Social History:   reports that he quit smoking about 24 years ago. His smoking use included cigarettes. He has never used smokeless tobacco. He reports current alcohol use. He reports that he does not use drugs.  Family History  Problem Relation Age of Onset   Stroke Father    Cancer Brother        prostate   Cancer Paternal Grandfather        colon    Medications: Patient's Medications  New Prescriptions   No medications on file  Previous Medications   AMPHETAMINE-DEXTROAMPHETAMINE (ADDERALL XR) 10 MG 24 HR CAPSULE    Take one  tablet once a day for ADD   BUTALBITAL-ACETAMINOPHEN-CAFFEINE (FIORICET) 50-325-40 MG TABLET    Take one tablet daily as needed for headache   CETIRIZINE (ZYRTEC) 10 MG TABLET    Take 10 mg by mouth as needed.   CLONAZEPAM (KLONOPIN) 0.5 MG TABLET    TAKE 1 TABLET BY MOUTH EVERY DAY AS NEEDED FOR ANXIETY   DICYCLOMINE (BENTYL) 20 MG TABLET    TAKE 1 TABLET BY MOUTH EVERY 6 HOURS AS NEEDED FOR PAIN OR IRRITABLE BOWEL SYNDROME   DORZOLAMIDEL-TIMOLOL (COSOPT) 22.3-6.8 MG/ML SOLN OPHTHALMIC SOLUTION    Place 1 drop into the right eye daily.   FAMOTIDINE-CALCIUM CARBONATE-MAGNESIUM HYDROXIDE (PEPCID COMPLETE) 10-800-165 MG CHEWABLE TABLET    Chew 1 tablet by mouth daily as needed.   FLUTICASONE (FLONASE) 50 MCG/ACT NASAL SPRAY    Place 1-2 sprays into the nose daily.   HALOBETASOL (ULTRAVATE) 0.05 % CREAM    APPLY EXTERNALLY TO RASH UP TO THREE TIMES DAILY   IBUPROFEN (ADVIL) 200 MG TABLET    Take 200 mg by mouth as needed.   LOSARTAN (COZAAR) 50 MG TABLET    TAKE 1 TABLET(50 MG) BY MOUTH DAILY   LOVASTATIN (MEVACOR) 20 MG TABLET    TAKE 1 TABLET(20 MG) BY MOUTH AT BEDTIME   MULTIPLE VITAMIN (MULTIVITAMIN) TABLET    Take 1 tablet by mouth daily.   Modified Medications   No medications on file  Discontinued Medications   CYCLOBENZAPRINE (FLEXERIL) 10 MG TABLET    Take 1 tablet (10 mg total) by mouth 3 (three) times daily as needed for muscle spasms.   NAPROXEN (NAPROSYN) 500 MG TABLET    Take 1 tablet (500 mg total) by mouth 2 (two) times daily with a meal.    Physical Exam:  Vitals:   11/28/21 0942  BP: (!) 142/94  Pulse: 85  Temp: (!) 97.3 F (36.3 C)  TempSrc: Temporal  SpO2: 98%  Weight: 192 lb (87.1 kg)  Height: 5' 10.5" (1.791 m)   Body mass index is 27.16 kg/m. Wt Readings from Last 3 Encounters:  11/28/21 192 lb (87.1 kg)  07/27/21 193 lb 12.8 oz (87.9 kg)  06/08/21 194 lb (88 kg)    Physical Exam Constitutional:      General: He is not in acute distress.    Appearance: He is well-developed. He is not diaphoretic.  HENT:     Head: Normocephalic and atraumatic.     Right Ear: External ear normal.     Left Ear: External ear normal.     Mouth/Throat:     Pharynx: No oropharyngeal exudate.  Eyes:     Conjunctiva/sclera: Conjunctivae normal.     Pupils: Pupils are equal, round, and reactive to light.  Cardiovascular:     Rate and Rhythm: Normal rate and regular rhythm.     Heart sounds: Normal heart sounds.  Pulmonary:     Effort: Pulmonary effort is normal.     Breath sounds: Normal breath sounds.  Abdominal:     General: Bowel sounds are normal.     Palpations: Abdomen is soft.  Musculoskeletal:        General: No tenderness.     Cervical back: Normal range of motion and neck supple.     Right lower leg: No edema.     Left lower leg: No edema.  Skin:    General: Skin is warm and dry.  Neurological:     Mental Status: He is alert and oriented to person, place,  and time.    Labs reviewed: Basic Metabolic Panel: Recent Labs    12/01/20 0806 06/08/21 0929 07/27/21 0933  NA 141 138 139  K 4.4 4.0 4.1  CL 105 100 101  CO2 _0 GLUCOSE 89 103* 84  BUN _1 CREATININE 0.87 0.99  0.91  CALCIUM 8.6 9.6 9.2  TSH  --  1.41  --    Liver Function Tests: Recent Labs    12/01/20 0806 06/08/21 0929  AST 42* 24  ALT 37 38  BILITOT 0.5 0.5  PROT 6.2 6.9   No results for input(s): LIPASE, AMYLASE in the last 8760 hours. No results for input(s): AMMONIA in the last 8760 hours. CBC: Recent Labs    12/01/20 0806 06/08/21 0929  WBC 7.0 6.6  NEUTROABS 3,570 3,623  HGB 14.8 14.8  HCT 44.0 43.5  MCV 96.9 95.8  PLT 189 219   Lipid Panel: Recent Labs    12/01/20 0806  CHOL 147  HDL 85  LDLCALC 48  TRIG 56  CHOLHDL 1.7   TSH: Recent Labs    06/08/21 0929  TSH 1.41   A1C: Lab Results  Component Value Date   HGBA1C 5.3 12/01/2020     Assessment/Plan 1. Benign essential HTN --Blood pressure elevated today, but typically well controlled -Patient reports bp typically elevated in office and home blood pressures are well controlled -No changes to medications today  -will have pt continue to monitor home bp goal <221/79 -follow metabolic panel  - CBC with Differential/Platelet - CMP with eGFR([facility])  2. Anxiety -controlled at this time.   3. Gastroesophageal reflux disease without esophagitis Stable, continues to use pepcid PRN.  4. Mixed hyperlipidemia -continues on lovastatin, not fasting today. - Lipid Panel - CMP with eGFR([facility])  5. Chronic tension-type headache, intractable Controlled, continues on fioricet PRN.   6. Acute right ankle pain Continues with pain on and off after fracture. Will have him use voltaren gel TID for 5 days to avoid NSAID due to hx of IBS and stomach discomfort. If he is having ongoing pain will get ortho referral.   7. Snores -he is at high risk of OSA due to [place]. At this time he does not wish to have any further evaluation.   8. Abdominal discomfort -has resolved at this time, possibly due to gas as it improved with pepcid and simethicone. Encouraged dietary modifications, if   9.  Irritable bowel syndrome with diarrhea Controlled at this time.   Next appt: 6 months, labs prior to appt  Jose Corvin K. [place], [facility] [phone]

## 2021-11-29 LAB — LIPID PANEL
Cholesterol: 184 mg/dL (ref <200)
HDL: 83 mg/dL (ref >=40)
LDL Cholesterol (Calc): 85 mg/dL (calc)
Non-HDL Cholesterol (Calc): 101 mg/dL (calc) (ref <130)
Total CHOL/HDL Ratio: 2.2 (calc) (ref <5.0)
Triglycerides: 72 mg/dL (ref <150)

## 2021-11-29 LAB — CBC WITH DIFFERENTIAL/PLATELET
Absolute Monocytes: 784 cells/uL (ref 200–950)
Basophils Absolute: 39 cells/uL (ref 0–200)
Basophils Relative: 0.4 %
Eosinophils Absolute: 529 cells/uL — ABNORMAL HIGH (ref 15–500)
Eosinophils Relative: 5.4 %
HCT: 42.7 % (ref 38.5–50.0)
Hemoglobin: 14.3 g/dL (ref 13.2–17.1)
Lymphs Abs: 1950 cells/uL (ref 850–3900)
MCH: 32.5 pg (ref 27.0–33.0)
MCHC: 33.5 g/dL (ref 32.0–36.0)
MCV: 97 fL (ref 80.0–100.0)
MPV: 10.4 fL (ref 7.5–12.5)
Monocytes Relative: 8 %
Neutro Abs: 6497 cells/uL (ref 1500–7800)
Neutrophils Relative %: 66.3 %
Platelets: 210 10*3/uL (ref 140–400)
RBC: 4.4 10*6/uL (ref 4.20–5.80)
RDW: 12 % (ref 11.0–15.0)
Total Lymphocyte: 19.9 %
WBC: 9.8 10*3/uL (ref 3.8–10.8)

## 2021-11-29 LAB — COMPLETE METABOLIC PANEL WITH GFR
AG Ratio: 2 (calc) (ref 1.0–2.5)
ALT: 34 U/L (ref 9–46)
AST: 24 U/L (ref 10–35)
Albumin: 4.5 g/dL (ref 3.6–5.1)
Alkaline phosphatase (APISO): 66 U/L (ref 35–144)
BUN: 17 mg/dL (ref 7–25)
CO2: 28 mmol/L (ref 20–32)
Calcium: 8.9 mg/dL (ref 8.6–10.3)
Chloride: 101 mmol/L (ref 98–110)
Creat: 0.99 mg/dL (ref 0.70–1.35)
Globulin: 2.2 g/dL (calc) (ref 1.9–3.7)
Glucose, Bld: 108 mg/dL (ref 65–139)
Potassium: 4.2 mmol/L (ref 3.5–5.3)
Sodium: 140 mmol/L (ref 135–146)
Total Bilirubin: 0.5 mg/dL (ref 0.2–1.2)
Total Protein: 6.7 g/dL (ref 6.1–8.1)
eGFR: 85 mL/min/{1.73_m2} (ref >=60)

## 2021-12-14 ENCOUNTER — Other Ambulatory Visit: Payer: Self-pay | Admitting: *Deleted

## 2021-12-14 DIAGNOSIS — E785 Hyperlipidemia, unspecified: Secondary | ICD-10-CM

## 2021-12-14 MED ORDER — LOVASTATIN 20 MG PO TABS: ORAL_TABLET | ORAL | 1 refills | Status: DC

## 2021-12-14 NOTE — Telephone Encounter (Signed)
Pharmacy called and stated that patient now uses their pharmacy and they are needing a Rx for patient's Lovastatin.

## 2021-12-20 ENCOUNTER — Other Ambulatory Visit: Payer: Self-pay | Admitting: *Deleted

## 2021-12-20 DIAGNOSIS — F902 Attention-deficit hyperactivity disorder, combined type: Secondary | ICD-10-CM

## 2021-12-20 MED ORDER — AMPHETAMINE-DEXTROAMPHET ER 10 MG PO CP24: ORAL_CAPSULE | ORAL | 0 refills | Status: DC

## 2021-12-20 NOTE — Telephone Encounter (Signed)
Patient requested refill.  Epic LR: 11/14/2021 Contract Date: 06/08/2021 Pended Rx and sent to [facility] for approval.

## 2022-01-18 ENCOUNTER — Encounter: Payer: Self-pay | Admitting: Family

## 2022-01-18 ENCOUNTER — Ambulatory Visit (INDEPENDENT_AMBULATORY_CARE_PROVIDER_SITE_OTHER): Payer: Managed Care, Other (non HMO) | Admitting: Family

## 2022-01-18 ENCOUNTER — Other Ambulatory Visit: Payer: Self-pay

## 2022-01-18 VITALS — BP 130/76 | HR 80 | Temp 97.6°F | Resp 16 | Ht 70.5 in | Wt 193.6 lb

## 2022-01-18 DIAGNOSIS — K5730 Diverticulosis of large intestine without perforation or abscess without bleeding: Secondary | ICD-10-CM

## 2022-01-18 MED ORDER — CIPROFLOXACIN HCL 500 MG PO TABS: 500.0000 mg | ORAL_TABLET | Freq: Two times a day (BID) | ORAL | 0 refills | Status: AC

## 2022-01-18 MED ORDER — METRONIDAZOLE 500 MG PO TABS: 500.0000 mg | ORAL_TABLET | Freq: Three times a day (TID) | ORAL | 0 refills | Status: AC

## 2022-01-18 NOTE — Patient Instructions (Signed)
- Notify provider if symptoms worsen or fail to improve    Diverticulitis Diverticulitis is infection or inflammation of small pouches (diverticula) in the colon that form due to a condition called diverticulosis. Diverticula can trap stool (feces) and bacteria, causing infection and inflammation. Diverticulitis may cause severe stomach pain and diarrhea. It may lead to tissue damage in the colon that causes bleeding or blockage. The diverticula may also burst (rupture) and cause infected stool to enter other areas of the abdomen. What are the causes? This condition is caused by stool becoming trapped in the diverticula, which allows bacteria to grow in the diverticula. This leads to inflammation and infection. What increases the risk? You are more likely to develop this condition if you have diverticulosis. The risk increases if you: Are overweight or obese. Do not get enough exercise. Drink alcohol. Use tobacco products. Eat a diet that has a lot of red meat such as beef, pork, or lamb. Eat a diet that does not include enough fiber. High-fiber foods include fruits, vegetables, beans, nuts, and whole grains. Are over 70 years of age. What are the signs or symptoms? Symptoms of this condition may include: Pain and tenderness in the abdomen. The pain is normally located on the left side of the abdomen, but it may occur in other areas. Fever and chills. Nausea. Vomiting. Cramping. Bloating. Changes in bowel routines. Blood in your stool. How is this diagnosed? This condition is diagnosed based on: Your medical history. A physical exam. Tests to make sure there is nothing else causing your condition. These tests may include: Blood tests. Urine tests. CT scan of the abdomen. How is this treated? Most cases of this condition are mild and can be treated at home. Treatment may include: Taking over-the-counter pain medicines. Following a clear liquid diet. Taking antibiotic medicines  by mouth. Resting. More severe cases may need to be treated at a hospital. Treatment may include: Not eating or drinking. Taking prescription pain medicine. Receiving antibiotic medicines through an IV. Receiving fluids and nutrition through an IV. Surgery. When your condition is under control, your health care provider may recommend that you have a colonoscopy. This is an exam to look at the entire large intestine. During the exam, a lubricated, bendable tube is inserted into the anus and then passed into the rectum, colon, and other parts of the large intestine. A colonoscopy can show how severe your diverticula are and whether something else may be causing your symptoms. Follow these instructions at home: Medicines Take over-the-counter and prescription medicines only as told by your health care provider. These include fiber supplements, probiotics, and stool softeners. If you were prescribed an antibiotic medicine, take it as told by your health care provider. Do not stop taking the antibiotic even if you start to feel better. Ask your health care provider if the medicine prescribed to you requires you to avoid driving or using machinery. Eating and drinking  Follow a full liquid diet or another diet as directed by your health care provider. After your symptoms improve, your health care provider may tell you to change your diet. He or she may recommend that you eat a diet that contains at least 25 grams (25 g) of fiber daily. Fiber makes it easier to pass stool. Healthy sources of fiber include: Berries. One cup contains 4-8 grams of fiber. Beans or lentils. One-half cup contains 5-8 grams of fiber. Green vegetables. One cup contains 4 grams of fiber. Avoid eating red meat. General instructions  Do not use any products that contain nicotine or tobacco, such as cigarettes, e-cigarettes, and chewing tobacco. If you need help quitting, ask your health care provider. Exercise for at least 30  minutes, 3 times each week. You should exercise hard enough to raise your heart rate and break a sweat. Keep all follow-up visits as told by your health care provider. This is important. You may need to have a colonoscopy. Contact a health care provider if: Your pain does not improve. Your bowel movements do not return to normal. Get help right away if: Your pain gets worse. Your symptoms do not get better with treatment. Your symptoms suddenly get worse. You have a fever. You vomit more than one time. You have stools that are bloody, black, or tarry. Summary Diverticulitis is infection or inflammation of small pouches (diverticula) in the colon that form due to a condition called diverticulosis. Diverticula can trap stool (feces) and bacteria, causing infection and inflammation. You are at higher risk for this condition if you have diverticulosis and you eat a diet that does not include enough fiber. Most cases of this condition are mild and can be treated at home. More severe cases may need to be treated at a hospital. When your condition is under control, your health care provider may recommend that you have an exam called a colonoscopy. This exam can show how severe your diverticula are and whether something else may be causing your symptoms. Keep all follow-up visits as told by your health care provider. This is important. This information is not intended to replace advice given to you by your health care provider. Make sure you discuss any questions you have with your health care provider. Document Revised: 09/22/2019 Document Reviewed: 09/22/2019 Elsevier Patient Education  2022 [employer].

## 2022-01-18 NOTE — Progress Notes (Signed)
Provider: Jahrel Borthwick FNP-C  Lauree Chandler, NP  Patient Care Team: Lauree Chandler, NP as PCP - General (Geriatric Medicine) Rozetta Nunnery, MD (Inactive) as Consulting Physician (Otolaryngology) Georganna Skeans, MD as Consulting Physician (General Surgery) Rutherford Guys, MD as Consulting Physician (Ophthalmology) Ladene Artist, MD as Consulting Physician (Gastroenterology) Rolm Bookbinder, MD as Consulting Physician (General Surgery) Allyn Kenner, MD (Dermatology) Nehemiah Settle, MD (Gastroenterology)  Extended Emergency Contact Information Primary Emergency Contact: Ensz,Lynn Address: [address]          [place], [place] [address] Johnnette Litter of [place] Phone: [phone] Mobile Phone: [phone] Relation: Spouse  Code Status:  Full Code  Goals of care: Advanced Directive information Advanced Directives 01/18/2022  Does Patient Have a Medical Advance Directive? Yes  Type of Paramedic of [place];Living will  Does patient want to make changes to medical advance directive? No - Patient declined  Copy of [place] in Chart? Yes - validated most recent copy scanned in chart (See row information)  Would patient like information on creating a medical advance directive? -  Pre-existing out of facility DNR order (yellow form or pink MOST form) -     Chief Complaint  Patient presents with   Acute Visit    Patient complains of possible Diverticulitis since Sunday morning 01/15/2022.    HPI:  Pt is a 66 y.o. male seen today for an acute visit for evaluation of left lower quadrant pain x 4 days.He describes pain as stabbing sometimes and constant.Has had similar abdominal pain in the past whenever he has diverticulitis that he has been treated since he was 66 yrs old.He denies any fever,chills,nausea ,vomiting or blood in the stool.He did have bloating feeling on Sunday.appetite is good.No abnormal  weight loss.His last colonoscopy was in 2019.    Past Medical History:  Diagnosis Date   Abdominal pain, left lower quadrant    Actinic keratosis    ADD (attention deficit disorder)    Alcohol abuse, unspecified    Anxiety    Anxiety state, unspecified    Arthritis    shoulders and hips   Atrial fibrillation (HCC)    Attention deficit disorder without mention of hyperactivity    Cervicalgia    COVID    Depressive disorder, not elsewhere classified    Eczema 11/25/2011   right hand   Edema    Encounter for long-term (current) use of other medications    Headache(784.0)    tension or sinus   High cholesterol    Inguinal hernia 11/25/2011   right   Inguinal hernia without mention of obstruction or gangrene, unilateral or unspecified, (not specified as recurrent)    Insomnia, unspecified    Internal hemorrhoids without mention of complication    Irritable bowel syndrome    Irritable bowel syndrome (IBS)    Lumbago    Major depressive disorder, recurrent episode, severe, without mention of psychotic behavior    Obesity, unspecified    Other and unspecified hyperlipidemia    Other atopic dermatitis and related conditions    Other disorders of vitreous    Other seborrheic keratosis    Other specified visual disturbances    Pain in joint, pelvic region and thigh    Pain in joint, site unspecified    Pathologic fracture of vertebrae    Poisoning and toxic reactions caused by other specified animals and plants    Recent retinal detachment, partial, with single defect  Routine general medical examination at a health care facility    Sciatica    Sinus infection 11/25/2011   started antibiotic 12/18/2011 x 7 days; current cough   Special screening for malignant neoplasm of prostate    Tension headache    Type II or unspecified type diabetes mellitus without mention of complication, not stated as uncontrolled    Unspecified essential hypertension    Past Surgical History:   Procedure Laterality Date   [place]   CATARACT EXTRACTION  2011   right eye   CATARACT EXTRACTION W/ INTRAOCULAR LENS IMPLANT Left 04/04/2017   Dr. Richardean Sale   EYE SURGERY Right 09/13/2018   Dr.Hanes with [facility]  04/15/14   thrombosed int. Hemorrhoid. Dr. Georganna Skeans   HERNIA REPAIR  05/03/2011   left   INGUINAL HERNIA REPAIR  12/28/2011   Procedure: HERNIA REPAIR INGUINAL ADULT;  Surgeon: Rolm Bookbinder, MD;  Location: [facility];  Service: General;  Laterality: Right;   NASAL POLYP SURGERY  01/05/2011   DR. [facility]    NASAL SINUS SURGERY  12/2010   removed anal warts  1976   Dr Allyson Sabal    RETINAL DETACHMENT REPAIR W/ SCLERAL BUCKLE LE  07/22/2008   right eye; pars plana vitrectomy   TONSILLECTOMY AND ADENOIDECTOMY  1964   TYMPANOSTOMY TUBE PLACEMENT  June 2015   Dr. Loletha Grayer. [place]    Allergies  Allergen Reactions   Amitriptyline    Zolpidem Tartrate Other (See Comments)    Severe headache   Amoxicillin-Pot Clavulanate Rash   Propoxyphene Hcl Nausea Only    Outpatient Encounter Medications as of 01/18/2022  Medication Sig   amphetamine-dextroamphetamine (ADDERALL XR) 10 MG 24 hr capsule Take one tablet once a day for ADD   butalbital-acetaminophen-caffeine (FIORICET) 50-325-40 MG tablet Take one tablet daily as needed for headache   cetirizine (ZYRTEC) 10 MG tablet Take 10 mg by mouth as needed.   clonazePAM (KLONOPIN) 0.5 MG tablet TAKE 1 TABLET BY MOUTH EVERY DAY AS NEEDED FOR ANXIETY   dicyclomine (BENTYL) 20 MG tablet TAKE 1 TABLET BY MOUTH EVERY 6 HOURS AS NEEDED FOR PAIN OR IRRITABLE BOWEL SYNDROME   dorzolamidel-timolol (COSOPT) 22.3-6.8 MG/ML SOLN ophthalmic solution Place 1 drop into the right eye daily.   famotidine-calcium carbonate-magnesium hydroxide (PEPCID COMPLETE) 10-800-165 MG chewable tablet Chew 1 tablet by mouth daily as needed.   fluticasone (FLONASE) 50 MCG/ACT nasal spray Place 1-2 sprays into the  nose daily.   halobetasol (ULTRAVATE) 0.05 % cream APPLY EXTERNALLY TO RASH UP TO THREE TIMES DAILY   losartan (COZAAR) 50 MG tablet TAKE 1 TABLET(50 MG) BY MOUTH DAILY   lovastatin (MEVACOR) 20 MG tablet TAKE 1 TABLET(20 MG) BY MOUTH AT BEDTIME   Multiple Vitamin (MULTIVITAMIN) tablet Take 1 tablet by mouth daily.   [DISCONTINUED] ibuprofen (ADVIL) 200 MG tablet Take 200 mg by mouth as needed.   No facility-administered encounter medications on file as of 01/18/2022.    Review of Systems  Constitutional:  Negative for appetite change, chills, fatigue, fever and unexpected weight change.  Eyes:  Negative for pain, discharge, redness, itching and visual disturbance.  Respiratory:  Negative for cough, chest tightness, shortness of breath and wheezing.   Cardiovascular:  Negative for chest pain, palpitations and leg swelling.  Gastrointestinal:  Positive for abdominal pain. Negative for abdominal distention, constipation, diarrhea, nausea and vomiting.       LLQ pain   Genitourinary:  Negative for difficulty  urinating, dysuria, flank pain, frequency and urgency.  Musculoskeletal:  Negative for arthralgias, back pain, gait problem, joint swelling, myalgias, neck pain and neck stiffness.  Neurological:  Negative for dizziness, syncope, speech difficulty, weakness, light-headedness, numbness and headaches.  Psychiatric/Behavioral:  Negative for agitation, behavioral problems, confusion, hallucinations, sleep disturbance and suicidal ideas. The patient is not nervous/anxious.    Immunization History  Administered Date(s) Administered   DTaP 05/31/2006   Influenza,inj,Quad PF,6+ Mos 09/09/2019, 09/21/2020, 10/28/2021   Influenza-Unspecified 10/06/2014, 09/28/2015, 09/25/2016, 09/25/2017, 09/25/2018   PFIZER(Purple Top)SARS-COV-2 Vaccination 03/18/2020, 04/12/2020, 10/21/2020   Pfizer Covid-19 Vaccine Bivalent Booster 14yrs & up 09/13/2021   Tdap 10/25/2013   Zoster Recombinat (Shingrix)  10/20/2019, 12/29/2019   Pertinent  Health Maintenance Due  Topic Date Due   COLONOSCOPY (Pts 45-44yrs Insurance coverage will need to be confirmed)  02/07/2028   INFLUENZA VACCINE  Completed   Fall Risk 12/05/2019 12/06/2020 07/27/2021 11/28/2021 01/18/2022  Falls in the past year? 0 1 1 1 1   Number of falls in past year - - - - -  Was there an injury with Fall? 0 0 1 1 1   Was there an injury with Fall? - - - right foot injury -  Fall Risk Category Calculator 0 1 2 2 2   Fall Risk Category Low Low Moderate Moderate Moderate  Patient Fall Risk Level Low fall risk Low fall risk Moderate fall risk Moderate fall risk Moderate fall risk  Patient at Risk for Falls Due to - - History of fall(s) History of fall(s) History of fall(s)  Fall risk Follow up - - Falls evaluation completed Falls evaluation completed Falls evaluation completed;Education provided;Falls prevention discussed   Functional Status Survey:    Vitals:   01/18/22 1034  BP: 130/76  Pulse: 80  Resp: 16  Temp: 97.6 F (36.4 C)  SpO2: 95%  Weight: 193 lb 9.6 oz (87.8 kg)  Height: 5' 10.5" (1.791 m)   Body mass index is 27.39 kg/m. Physical Exam Vitals reviewed.  Constitutional:      General: He is not in acute distress.    Appearance: Normal appearance. He is normal weight. He is not ill-appearing or diaphoretic.  HENT:     Head: Normocephalic.     Nose: Nose normal. No congestion or rhinorrhea.     Mouth/Throat:     Mouth: Mucous membranes are moist.     Pharynx: Oropharynx is clear. No oropharyngeal exudate or posterior oropharyngeal erythema.  Eyes:     General: No scleral icterus.       Right eye: No discharge.        Left eye: No discharge.     Conjunctiva/sclera: Conjunctivae normal.     Pupils: Pupils are equal, round, and reactive to light.  Cardiovascular:     Rate and Rhythm: Normal rate and regular rhythm.     Pulses: Normal pulses.     Heart sounds: Normal heart sounds. No murmur heard.   No  friction rub. No gallop.  Pulmonary:     Effort: Pulmonary effort is normal. No respiratory distress.     Breath sounds: Normal breath sounds. No wheezing, rhonchi or rales.  Chest:     Chest wall: No tenderness.  Abdominal:     General: Bowel sounds are normal. There is no distension.     Palpations: Abdomen is soft. There is no mass.     Tenderness: There is abdominal tenderness in the left lower quadrant. There is no right CVA tenderness, left CVA tenderness,  guarding or rebound.  Musculoskeletal:        General: No swelling or tenderness. Normal range of motion.     Right lower leg: No edema.     Left lower leg: No edema.  Skin:    General: Skin is warm and dry.     Coloration: Skin is not pale.     Findings: No bruising, erythema, lesion or rash.  Neurological:     Mental Status: He is alert and oriented to person, place, and time.     Motor: No weakness.     Gait: Gait normal.  Psychiatric:        Mood and Affect: Mood normal.        Speech: Speech normal.        Behavior: Behavior normal.    Labs reviewed: Recent Labs    06/08/21 0929 07/27/21 0933 11/28/21 1015  NA 138 139 140  K 4.0 4.1 4.2  CL 100 101 101  CO2 29 24 28   GLUCOSE 103* 84 108  BUN 12 15 17   CREATININE 0.99 0.91 0.99  CALCIUM 9.6 9.2 8.9   Recent Labs    06/08/21 0929 11/28/21 1015  AST 24 24  ALT 38 34  BILITOT 0.5 0.5  PROT 6.9 6.7   Recent Labs    06/08/21 0929 11/28/21 1015  WBC 6.6 9.8  NEUTROABS 3,623 6,497  HGB 14.8 14.3  HCT 43.5 42.7  MCV 95.8 97.0  PLT 219 210   Lab Results  Component Value Date   TSH 1.41 06/08/2021   Lab Results  Component Value Date   HGBA1C 5.3 12/01/2020   Lab Results  Component Value Date   CHOL 184 11/28/2021   HDL 83 11/28/2021   LDLCALC 85 11/28/2021   TRIG 72 11/28/2021   CHOLHDL 2.2 11/28/2021    Significant Diagnostic Results in last 30 days:  No results found.  Assessment/Plan  Diverticulosis of colon without  hemorrhage Afebrile  LLQ tenderness on palpation.No bleeding reported  Will treat with Cipro and Metronidazole.Has taken both in the past.  - ciprofloxacin (CIPRO) 500 MG tablet; Take 1 tablet (500 mg total) by mouth 2 (two) times daily for 10 days.  Dispense: 20 tablet; Refill: 0 - metroNIDAZOLE (FLAGYL) 500 MG tablet; Take 1 tablet (500 mg total) by mouth 3 (three) times daily for 7 days.  Dispense: 21 tablet; Refill: 0 - Advised to notify provider if symptoms worsen or failed to improve   Family/ staff Communication: Reviewed plan of care with patient verbalized understanding   Labs/tests ordered: None   Next Appointment: As needed if symptoms worsen or fail to improve    Sandrea Hughs, NP

## 2022-01-24 ENCOUNTER — Other Ambulatory Visit: Payer: Self-pay | Admitting: *Deleted

## 2022-01-24 DIAGNOSIS — F902 Attention-deficit hyperactivity disorder, combined type: Secondary | ICD-10-CM

## 2022-01-24 MED ORDER — AMPHETAMINE-DEXTROAMPHET ER 10 MG PO CP24: ORAL_CAPSULE | ORAL | 0 refills | Status: DC

## 2022-01-24 NOTE — Telephone Encounter (Signed)
Patient requested refill.  Epic LR: 12/20/2021 Contract Date: 06/08/2022 Added note to upcoming appointment to update.   Pended Rx and sent to [facility] for approval.

## 2022-02-27 ENCOUNTER — Other Ambulatory Visit: Payer: Self-pay | Admitting: *Deleted

## 2022-02-27 DIAGNOSIS — F902 Attention-deficit hyperactivity disorder, combined type: Secondary | ICD-10-CM

## 2022-02-27 MED ORDER — AMPHETAMINE-DEXTROAMPHET ER 10 MG PO CP24: ORAL_CAPSULE | ORAL | 0 refills | Status: DC

## 2022-02-27 NOTE — Telephone Encounter (Signed)
Patient requested refill.  ?Last RF: 01/24/2022 ?Contract date: 06/08/2021 ?Pended Rx and sent to [facility] for approval.  ?

## 2022-03-21 ENCOUNTER — Encounter: Payer: Self-pay | Admitting: Nurse Practitioner

## 2022-03-21 ENCOUNTER — Ambulatory Visit (INDEPENDENT_AMBULATORY_CARE_PROVIDER_SITE_OTHER): Payer: Managed Care, Other (non HMO) | Admitting: Nurse Practitioner

## 2022-03-21 ENCOUNTER — Other Ambulatory Visit: Payer: Self-pay

## 2022-03-21 VITALS — BP 140/92 | HR 76 | Temp 97.7°F | Ht 70.5 in | Wt 195.8 lb

## 2022-03-21 DIAGNOSIS — I10 Essential (primary) hypertension: Secondary | ICD-10-CM | POA: Diagnosis not present

## 2022-03-21 DIAGNOSIS — K5730 Diverticulosis of large intestine without perforation or abscess without bleeding: Secondary | ICD-10-CM

## 2022-03-21 MED ORDER — METRONIDAZOLE 500 MG PO TABS: 500.0000 mg | ORAL_TABLET | Freq: Three times a day (TID) | ORAL | 0 refills | Status: AC

## 2022-03-21 MED ORDER — CIPROFLOXACIN HCL 500 MG PO TABS
500.0000 mg | ORAL_TABLET | Freq: Two times a day (BID) | ORAL | 0 refills | Status: AC
Start: 2022-03-21 — End: 2022-03-28

## 2022-03-21 NOTE — Progress Notes (Signed)
? ? ?Careteam: ?Patient Care Team: ?Lauree Chandler, NP as PCP - General (Geriatric Medicine) ?Rozetta Nunnery, MD (Inactive) as Consulting Physician (Otolaryngology) ?Georganna Skeans, MD as Consulting Physician (General Surgery) ?Rutherford Guys, MD as Consulting Physician (Ophthalmology) ?Ladene Artist, MD as Consulting Physician (Gastroenterology) ?Rolm Bookbinder, MD as Consulting Physician (General Surgery) ?Allyn Kenner, MD (Dermatology) ?Nehemiah Settle, MD (Gastroenterology) ? ?PLACE OF SERVICE:  ?[facility]  ?Advanced Directive information ?  ? ?Allergies  ?Allergen Reactions  ? Amitriptyline   ? Zolpidem Tartrate Other (See Comments)  ?  Severe headache  ? Amoxicillin-Pot Clavulanate Rash  ? Propoxyphene Hcl Nausea Only  ? ? ?Chief Complaint  ?Patient presents with  ? Acute Visit  ?  Patient having diverticulitis episode. Patient started having pain yesterday. A few days ago he had what he thought was IBS cramping and pain got worse yesterday. Patient took dicyclomine,but didn't help  ? ? ? ?HPI: Patient is a 66 y.o. male for abdominal pain.  ?Pain and tenderness in lower abdomen started a few days ago but has continued. ?Yesterday he met wife for lunch and then increased in pain and last night low grade fever of 99.4.  ?He had been 18 months without issues but had episodes in January and now with symptoms. No changes in diet.  ?Pain 3/10 at this time. He did eat breakfast. Pain was not worse after he ate.  ?Pain at its worse when he was trying to get out of bed this morning. He was able to get up and do morning routine with pain, subsided after this.  ? ? ? ? ?Review of Systems:  ?Review of Systems  ?Constitutional:  Positive for chills, fever and malaise/fatigue.  ?Gastrointestinal:  Positive for abdominal pain.  ? ?Past Medical History:  ?Diagnosis Date  ? Abdominal pain, left lower quadrant   ? Actinic keratosis   ? ADD (attention deficit disorder)   ? Alcohol abuse, unspecified   ? Anxiety    ? Anxiety state, unspecified   ? Arthritis   ? shoulders and hips  ? Atrial fibrillation ([place])   ? Attention deficit disorder without mention of hyperactivity   ? Cervicalgia   ? COVID   ? Depressive disorder, not elsewhere classified   ? Eczema 11/25/2011  ? right hand  ? Edema   ? Encounter for long-term (current) use of other medications   ? Headache(784.0)   ? tension or sinus  ? High cholesterol   ? Inguinal hernia 11/25/2011  ? right  ? Inguinal hernia without mention of obstruction or gangrene, unilateral or unspecified, (not specified as recurrent)   ? Insomnia, unspecified   ? Internal hemorrhoids without mention of complication   ? Irritable bowel syndrome   ? Irritable bowel syndrome (IBS)   ? Lumbago   ? Major depressive disorder, recurrent episode, severe, without mention of psychotic behavior   ? Obesity, unspecified   ? Other and unspecified hyperlipidemia   ? Other atopic dermatitis and related conditions   ? Other disorders of vitreous   ? Other seborrheic keratosis   ? Other specified visual disturbances   ? Pain in joint, pelvic region and thigh   ? Pain in joint, site unspecified   ? Pathologic fracture of vertebrae   ? Poisoning and toxic reactions caused by other specified animals and plants   ? Recent retinal detachment, partial, with single defect   ? Routine general medical examination at a health care facility   ? Sciatica   ?  Sinus infection 11/25/2011  ? started antibiotic 12/18/2011 x 7 days; current cough  ? Special screening for malignant neoplasm of prostate   ? Tension headache   ? Type II or unspecified type diabetes mellitus without mention of complication, not stated as uncontrolled   ? Unspecified essential hypertension   ? ?Past Surgical History:  ?Procedure Laterality Date  ? APPENDECTOMY  1987  ? CATARACT EXTRACTION  2011  ? right eye  ? CATARACT EXTRACTION W/ INTRAOCULAR LENS IMPLANT Left 04/04/2017  ? Dr. Richardean Sale  ? EYE SURGERY Right 09/13/2018  ? Dr.Hanes with [facility]   ? HEMORRHOID SURGERY  04/15/14  ? thrombosed int. Hemorrhoid. Dr. Georganna Skeans  ? HERNIA REPAIR  05/03/2011  ? left  ? INGUINAL HERNIA REPAIR  12/28/2011  ? Procedure: HERNIA REPAIR INGUINAL ADULT;  Surgeon: Rolm Bookbinder, MD;  Location: [facility];  Service: General;  Laterality: Right;  ? NASAL POLYP SURGERY  01/05/2011  ? DR. Lucia Gaskins   ? NASAL SINUS SURGERY  12/2010  ? removed anal warts  1976  ? Dr Allyson Sabal   ? RETINAL DETACHMENT REPAIR W/ SCLERAL BUCKLE LE  07/22/2008  ? right eye; pars plana vitrectomy  ? [place]  ? TYMPANOSTOMY TUBE PLACEMENT  June 2015  ? Dr. Loletha Grayer. [place]  ? ?Social History: ?  reports that he quit smoking about 24 years ago. His smoking use included cigarettes. He has never used smokeless tobacco. He reports current alcohol use. He reports that he does not use drugs. ? ?Family History  ?Problem Relation Age of Onset  ? Stroke Father   ? Cancer Brother   ?     prostate  ? Cancer Paternal Grandfather   ?     colon  ? ? ?Medications: ?Patient's Medications  ?New Prescriptions  ? No medications on file  ?Previous Medications  ? AMPHETAMINE-DEXTROAMPHETAMINE (ADDERALL XR) 10 MG 24 HR CAPSULE    Take one tablet once a day for ADD  ? BUTALBITAL-ACETAMINOPHEN-CAFFEINE (FIORICET) 50-325-40 MG TABLET    Take one tablet daily as needed for headache  ? CETIRIZINE (ZYRTEC) 10 MG TABLET    Take 10 mg by mouth as needed.  ? CLONAZEPAM (KLONOPIN) 0.5 MG TABLET    TAKE 1 TABLET BY MOUTH EVERY DAY AS NEEDED FOR ANXIETY  ? DICYCLOMINE (BENTYL) 20 MG TABLET    TAKE 1 TABLET BY MOUTH EVERY 6 HOURS AS NEEDED FOR PAIN OR IRRITABLE BOWEL SYNDROME  ? DORZOLAMIDEL-TIMOLOL (COSOPT) 22.3-6.8 MG/ML SOLN OPHTHALMIC SOLUTION    Place 1 drop into the right eye daily.  ? FAMOTIDINE-CALCIUM CARBONATE-MAGNESIUM HYDROXIDE (PEPCID COMPLETE) 10-800-165 MG CHEWABLE TABLET    Chew 1 tablet by mouth daily as needed.  ? FLUTICASONE (FLONASE) 50 MCG/ACT NASAL SPRAY    Place 1-2 sprays  into the nose daily.  ? HALOBETASOL (ULTRAVATE) 0.05 % CREAM    APPLY EXTERNALLY TO RASH UP TO THREE TIMES DAILY  ? LOSARTAN (COZAAR) 50 MG TABLET    TAKE 1 TABLET(50 MG) BY MOUTH DAILY  ? LOVASTATIN (MEVACOR) 20 MG TABLET    TAKE 1 TABLET(20 MG) BY MOUTH AT BEDTIME  ? MULTIPLE VITAMIN (MULTIVITAMIN) TABLET    Take 1 tablet by mouth daily.  ?Modified Medications  ? No medications on file  ?Discontinued Medications  ? No medications on file  ? ? ?Physical Exam: ? ?Vitals:  ? 03/21/22 1328  ?BP: (!) 170/90  ?Pulse: 76  ?Temp: 97.7 ?F (36.5 ?C)  ?SpO2:  98%  ?Weight: 195 lb 12.8 oz (88.8 kg)  ?Height: 5' 10.5" (1.791 m)  ? ?Body mass index is 27.7 kg/m?. ?Wt Readings from Last 3 Encounters:  ?03/21/22 195 lb 12.8 oz (88.8 kg)  ?01/18/22 193 lb 9.6 oz (87.8 kg)  ?11/28/21 192 lb (87.1 kg)  ? ? ?Physical Exam ?Constitutional:   ?   General: He is not in acute distress. ?   Appearance: He is well-developed. He is not diaphoretic.  ?HENT:  ?   Head: Normocephalic and atraumatic.  ?   Right Ear: External ear normal.  ?   Left Ear: External ear normal.  ?   Mouth/Throat:  ?   Pharynx: No oropharyngeal exudate.  ?Eyes:  ?   Conjunctiva/sclera: Conjunctivae normal.  ?   Pupils: Pupils are equal, round, and reactive to light.  ?Cardiovascular:  ?   Rate and Rhythm: Normal rate and regular rhythm.  ?   Heart sounds: Normal heart sounds.  ?Pulmonary:  ?   Effort: Pulmonary effort is normal.  ?Abdominal:  ?   General: Bowel sounds are normal.  ?   Palpations: Abdomen is soft.  ?   Tenderness: There is abdominal tenderness (left lower quad). There is guarding.  ?Musculoskeletal:     ?   General: No tenderness.  ?   Cervical back: Normal range of motion and neck supple.  ?   Right lower leg: No edema.  ?   Left lower leg: No edema.  ?Skin: ?   General: Skin is warm and dry.  ?Neurological:  ?   Mental Status: He is alert and oriented to person, place, and time.  ? ? ?Labs reviewed: ?Basic Metabolic Panel: ?Recent Labs  ?   06/08/21 ?0929 07/27/21 ?3754 11/28/21 ?1015  ?NA 138 139 140  ?K 4.0 4.1 4.2  ?CL 100 101 101  ?CO2 '29 24 28  ' ?GLUCOSE 103* 84 108  ?BUN '12 15 17  ' ?CREATININE 0.99 0.91 0.99  ?CALCIUM 9.6 9.2 8.9  ?TSH 1.41  --

## 2022-04-03 ENCOUNTER — Other Ambulatory Visit: Payer: Self-pay | Admitting: *Deleted

## 2022-04-03 DIAGNOSIS — F902 Attention-deficit hyperactivity disorder, combined type: Secondary | ICD-10-CM

## 2022-04-03 MED ORDER — AMPHETAMINE-DEXTROAMPHET ER 10 MG PO CP24: ORAL_CAPSULE | ORAL | 0 refills | Status: DC

## 2022-04-03 NOTE — Telephone Encounter (Signed)
Patient requested refill and requested it to go to [facility].  ?Epic LR: 02/27/2022 ?Contract Date: 06/08/2021 ?Pended Rx and sent to [facility] for approval.  ?

## 2022-04-12 ENCOUNTER — Encounter: Payer: Self-pay | Admitting: Orthopedic Surgery

## 2022-04-13 ENCOUNTER — Encounter: Payer: Self-pay | Admitting: Orthopedic Surgery

## 2022-04-13 ENCOUNTER — Ambulatory Visit: Payer: Managed Care, Other (non HMO) | Admitting: Orthopedic Surgery

## 2022-04-13 VITALS — BP 148/96 | HR 86 | Temp 97.5°F | Ht 70.5 in | Wt 193.4 lb

## 2022-04-13 DIAGNOSIS — W57XXXA Bitten or stung by nonvenomous insect and other nonvenomous arthropods, initial encounter: Secondary | ICD-10-CM | POA: Diagnosis not present

## 2022-04-13 DIAGNOSIS — S40862A Insect bite (nonvenomous) of left upper arm, initial encounter: Secondary | ICD-10-CM

## 2022-04-13 MED ORDER — DOXYCYCLINE HYCLATE 100 MG PO TABS: 100.0000 mg | ORAL_TABLET | Freq: Two times a day (BID) | ORAL | 0 refills | Status: DC

## 2022-04-13 NOTE — Patient Instructions (Signed)
Doxycycline sent to pharmacy- please complete medicine ? ?May take probiotic or eat yogurt for GI upset.  ?

## 2022-04-13 NOTE — Progress Notes (Signed)
? ? ?Careteam: ?Patient Care Team: ?Lauree Chandler, NP as PCP - General (Geriatric Medicine) ?Rozetta Nunnery, MD (Inactive) as Consulting Physician (Otolaryngology) ?Georganna Skeans, MD as Consulting Physician (General Surgery) ?Rutherford Guys, MD as Consulting Physician (Ophthalmology) ?Ladene Artist, MD as Consulting Physician (Gastroenterology) ?Rolm Bookbinder, MD as Consulting Physician (General Surgery) ?Allyn Kenner, MD (Dermatology) ?Nehemiah Settle, MD (Gastroenterology) ? ?Seen by: Windell Moulding, AGNP-C ? ?PLACE OF SERVICE:  ?[facility]  ?Advanced Directive information ?Does Patient Have a Medical Advance Directive?: No;Yes, Type of Advance Directive: Healthcare Power of [place];Living will, Does patient want to make changes to medical advance directive?: No - Patient declined ? ?Allergies  ?Allergen Reactions  ? Amitriptyline   ? Zolpidem Tartrate Other (See Comments)  ?  Severe headache  ? Amoxicillin-Pot Clavulanate Rash  ? Propoxyphene Hcl Nausea Only  ? ? ?Chief Complaint  ?Patient presents with  ? Acute Visit  ?  Patient complains of finding tick under left arm pit 10-14 days ago. Patient has red bumps in area. Patient is now having fever, body aches, chills, headaches, and fever. Patient stated that his legs feel itchy.   ? ? ? ?HPI: Patient is a 66 y.o. male seen today  ? ?Noticed a tick bite to left axilla about 10 days ago while walking dogs. Tick described as red and raised. He removed tick with tweezers the next day. He felt fine for a few days and then began having symptoms of fever, headache, chills, sweating, and lack of appetite. Last night his extremities began to itch. No apparent rash. He took a benadryl and symptoms resolved. Denies myalgias, arthritic pain, or weakness.  ? ?Review of Systems:  ?Review of Systems  ?Constitutional:  Positive for chills, fever and malaise/fatigue.  ?HENT: Negative.    ?Respiratory:  Negative for cough, shortness of breath and wheezing.    ?Cardiovascular:  Negative for chest pain and leg swelling.  ?Musculoskeletal:  Positive for myalgias.  ?Skin:  Positive for itching. Negative for rash.  ?     Recent tick bite  ?Neurological:  Negative for dizziness, tingling, sensory change and weakness.  ?Psychiatric/Behavioral:  Negative for depression. The patient is not nervous/anxious.   ? ?Past Medical History:  ?Diagnosis Date  ? Abdominal pain, left lower quadrant   ? Actinic keratosis   ? ADD (attention deficit disorder)   ? Alcohol abuse, unspecified   ? Anxiety   ? Anxiety state, unspecified   ? Arthritis   ? shoulders and hips  ? Atrial fibrillation ([place])   ? Attention deficit disorder without mention of hyperactivity   ? Cervicalgia   ? COVID   ? Depressive disorder, not elsewhere classified   ? Eczema 11/25/2011  ? right hand  ? Edema   ? Encounter for long-term (current) use of other medications   ? Headache(784.0)   ? tension or sinus  ? High cholesterol   ? Inguinal hernia 11/25/2011  ? right  ? Inguinal hernia without mention of obstruction or gangrene, unilateral or unspecified, (not specified as recurrent)   ? Insomnia, unspecified   ? Internal hemorrhoids without mention of complication   ? Irritable bowel syndrome   ? Irritable bowel syndrome (IBS)   ? Lumbago   ? Major depressive disorder, recurrent episode, severe, without mention of psychotic behavior   ? Obesity, unspecified   ? Other and unspecified hyperlipidemia   ? Other atopic dermatitis and related conditions   ? Other disorders of vitreous   ?  Other seborrheic keratosis   ? Other specified visual disturbances   ? Pain in joint, pelvic region and thigh   ? Pain in joint, site unspecified   ? Pathologic fracture of vertebrae   ? Poisoning and toxic reactions caused by other specified animals and plants   ? Recent retinal detachment, partial, with single defect   ? Routine general medical examination at a health care facility   ? Sciatica   ? Sinus infection 11/25/2011  ? started  antibiotic 12/18/2011 x 7 days; current cough  ? Special screening for malignant neoplasm of prostate   ? Tension headache   ? Type II or unspecified type diabetes mellitus without mention of complication, not stated as uncontrolled   ? Unspecified essential hypertension   ? ?Past Surgical History:  ?Procedure Laterality Date  ? APPENDECTOMY  1987  ? CATARACT EXTRACTION  2011  ? right eye  ? CATARACT EXTRACTION W/ INTRAOCULAR LENS IMPLANT Left 04/04/2017  ? Dr. Richardean Sale  ? EYE SURGERY Right 09/13/2018  ? Dr.Hanes with [facility]   ? HEMORRHOID SURGERY  04/15/14  ? thrombosed int. Hemorrhoid. Dr. Georganna Skeans  ? HERNIA REPAIR  05/03/2011  ? left  ? INGUINAL HERNIA REPAIR  12/28/2011  ? Procedure: HERNIA REPAIR INGUINAL ADULT;  Surgeon: Rolm Bookbinder, MD;  Location: [facility];  Service: General;  Laterality: Right;  ? NASAL POLYP SURGERY  01/05/2011  ? DR. Lucia Gaskins   ? NASAL SINUS SURGERY  12/2010  ? removed anal warts  1976  ? Dr Allyson Sabal   ? RETINAL DETACHMENT REPAIR W/ SCLERAL BUCKLE LE  07/22/2008  ? right eye; pars plana vitrectomy  ? [place]  ? TYMPANOSTOMY TUBE PLACEMENT  June 2015  ? Dr. Loletha Grayer. [place]  ? ?Social History: ?  reports that he quit smoking about 24 years ago. His smoking use included cigarettes. He has never used smokeless tobacco. He reports current alcohol use. He reports that he does not use drugs. ? ?Family History  ?Problem Relation Age of Onset  ? Stroke Father   ? Cancer Brother   ?     prostate  ? Cancer Paternal Grandfather   ?     colon  ? ? ?Medications: ?Patient's Medications  ?New Prescriptions  ? No medications on file  ?Previous Medications  ? AMPHETAMINE-DEXTROAMPHETAMINE (ADDERALL XR) 10 MG 24 HR CAPSULE    Take one tablet once a day for ADD  ? CETIRIZINE (ZYRTEC) 10 MG TABLET    Take 10 mg by mouth as needed.  ? CLONAZEPAM (KLONOPIN) 0.5 MG TABLET    TAKE 1 TABLET BY MOUTH EVERY DAY AS NEEDED FOR ANXIETY  ? DICYCLOMINE (BENTYL) 20 MG  TABLET    TAKE 1 TABLET BY MOUTH EVERY 6 HOURS AS NEEDED FOR PAIN OR IRRITABLE BOWEL SYNDROME  ? DORZOLAMIDEL-TIMOLOL (COSOPT) 22.3-6.8 MG/ML SOLN OPHTHALMIC SOLUTION    Place 1 drop into the right eye daily.  ? FAMOTIDINE-CALCIUM CARBONATE-MAGNESIUM HYDROXIDE (PEPCID COMPLETE) 10-800-165 MG CHEWABLE TABLET    Chew 1 tablet by mouth daily as needed.  ? FLUTICASONE (FLONASE) 50 MCG/ACT NASAL SPRAY    Place 1-2 sprays into the nose daily.  ? HALOBETASOL (ULTRAVATE) 0.05 % CREAM    APPLY EXTERNALLY TO RASH UP TO THREE TIMES DAILY  ? LOSARTAN (COZAAR) 50 MG TABLET    TAKE 1 TABLET(50 MG) BY MOUTH DAILY  ? LOVASTATIN (MEVACOR) 20 MG TABLET    TAKE 1 TABLET(20 MG) BY MOUTH AT  BEDTIME  ? MULTIPLE VITAMIN (MULTIVITAMIN) TABLET    Take 1 tablet by mouth daily.  ?Modified Medications  ? No medications on file  ?Discontinued Medications  ? BUTALBITAL-ACETAMINOPHEN-CAFFEINE (FIORICET) 50-325-40 MG TABLET    Take one tablet daily as needed for headache  ? ? ?Physical Exam: ? ?Vitals:  ? 04/13/22 0830  ?BP: (!) 148/96  ?Pulse: 86  ?Temp: (!) 97.5 ?F (36.4 ?C)  ?TempSrc: Temporal  ?SpO2: 98%  ?Weight: 193 lb 6.4 oz (87.7 kg)  ?Height: 5' 10.5" (1.791 m)  ? ?Body mass index is 27.36 kg/m?. ?Wt Readings from Last 3 Encounters:  ?04/13/22 193 lb 6.4 oz (87.7 kg)  ?03/21/22 195 lb 12.8 oz (88.8 kg)  ?01/18/22 193 lb 9.6 oz (87.8 kg)  ? ? ?Physical Exam ?Vitals reviewed.  ?Constitutional:   ?   General: He is not in acute distress. ?HENT:  ?   Head: Normocephalic.  ?Cardiovascular:  ?   Rate and Rhythm: Normal rate and regular rhythm.  ?   Pulses: Normal pulses.  ?   Heart sounds: Normal heart sounds.  ?Pulmonary:  ?   Effort: Pulmonary effort is normal. No respiratory distress.  ?   Breath sounds: Normal breath sounds. No wheezing.  ?Musculoskeletal:  ?   Cervical back: Neck supple.  ?   Right lower leg: No edema.  ?   Left lower leg: No edema.  ?Lymphadenopathy:  ?   Cervical: No cervical adenopathy.  ?Skin: ?   General: Skin is warm  and dry.  ?   Capillary Refill: Capillary refill takes less than 2 seconds.  ?   Comments: Small, papule to left axilla, no skin breakdown, no redness or swelling to area  ?Neurological:  ?   General: No focal defi

## 2022-04-18 ENCOUNTER — Other Ambulatory Visit: Payer: Self-pay | Admitting: Nurse Practitioner

## 2022-04-18 DIAGNOSIS — I10 Essential (primary) hypertension: Secondary | ICD-10-CM

## 2022-04-24 ENCOUNTER — Other Ambulatory Visit: Payer: Self-pay | Admitting: Nurse Practitioner

## 2022-04-24 DIAGNOSIS — F419 Anxiety disorder, unspecified: Secondary | ICD-10-CM

## 2022-04-24 NOTE — Telephone Encounter (Signed)
Prescription "Klonopin" already refilled by PCP Lauree Chandler, NP today 04/24/2022. Medication resent to Lauree Chandler, NP .  ?

## 2022-05-01 ENCOUNTER — Encounter: Payer: Self-pay | Admitting: Orthopedic Surgery

## 2022-05-01 NOTE — Telephone Encounter (Signed)
Message forward to Hazle Nordmann, NP  ?

## 2022-05-08 ENCOUNTER — Other Ambulatory Visit: Payer: Self-pay | Admitting: *Deleted

## 2022-05-08 DIAGNOSIS — F902 Attention-deficit hyperactivity disorder, combined type: Secondary | ICD-10-CM

## 2022-05-08 MED ORDER — AMPHETAMINE-DEXTROAMPHET ER 10 MG PO CP24: ORAL_CAPSULE | ORAL | 0 refills | Status: DC

## 2022-05-08 NOTE — Telephone Encounter (Signed)
Patient requested refill.  ?Epic LR: 04/03/2022 ?Contract Date: 06/08/2021 ?Pended Rx and sent to [facility] for approval.  ?

## 2022-05-25 ENCOUNTER — Other Ambulatory Visit: Payer: 59

## 2022-05-29 ENCOUNTER — Ambulatory Visit: Payer: 59 | Admitting: Nurse Practitioner

## 2022-06-05 ENCOUNTER — Encounter: Payer: Self-pay | Admitting: Nurse Practitioner

## 2022-06-05 DIAGNOSIS — Z8042 Family history of malignant neoplasm of prostate: Secondary | ICD-10-CM

## 2022-06-23 ENCOUNTER — Other Ambulatory Visit: Payer: Self-pay | Admitting: *Deleted

## 2022-06-23 DIAGNOSIS — E785 Hyperlipidemia, unspecified: Secondary | ICD-10-CM

## 2022-06-23 MED ORDER — LOVASTATIN 20 MG PO TABS: ORAL_TABLET | ORAL | 1 refills | Status: DC

## 2022-06-23 NOTE — Telephone Encounter (Signed)
Received refill Request from [facility]

## 2022-06-26 ENCOUNTER — Other Ambulatory Visit: Payer: Medicare Other

## 2022-06-26 DIAGNOSIS — Z8042 Family history of malignant neoplasm of prostate: Secondary | ICD-10-CM

## 2022-06-26 DIAGNOSIS — E782 Mixed hyperlipidemia: Secondary | ICD-10-CM | POA: Diagnosis not present

## 2022-06-26 DIAGNOSIS — I10 Essential (primary) hypertension: Secondary | ICD-10-CM | POA: Diagnosis not present

## 2022-06-27 LAB — COMPLETE METABOLIC PANEL WITH GFR
AG Ratio: 2.3 (calc) (ref 1.0–2.5)
ALT: 33 U/L (ref 9–46)
AST: 26 U/L (ref 10–35)
Albumin: 4.5 g/dL (ref 3.6–5.1)
Alkaline phosphatase (APISO): 55 U/L (ref 35–144)
BUN: 14 mg/dL (ref 7–25)
CO2: 28 mmol/L (ref 20–32)
Calcium: 9.8 mg/dL (ref 8.6–10.3)
Chloride: 103 mmol/L (ref 98–110)
Creat: 1.13 mg/dL (ref 0.70–1.35)
Globulin: 2 g/dL (calc) (ref 1.9–3.7)
Glucose, Bld: 111 mg/dL — ABNORMAL HIGH (ref 65–99)
Potassium: 4.7 mmol/L (ref 3.5–5.3)
Sodium: 140 mmol/L (ref 135–146)
Total Bilirubin: 0.5 mg/dL (ref 0.2–1.2)
Total Protein: 6.5 g/dL (ref 6.1–8.1)
eGFR: 73 mL/min/{1.73_m2} (ref >=60)

## 2022-06-27 LAB — CBC WITH DIFFERENTIAL/PLATELET
Absolute Monocytes: 910 cells/uL (ref 200–950)
Basophils Absolute: 54 cells/uL (ref 0–200)
Basophils Relative: 0.5 %
Eosinophils Absolute: 717 cells/uL — ABNORMAL HIGH (ref 15–500)
Eosinophils Relative: 6.7 %
HCT: 43.5 % (ref 38.5–50.0)
Hemoglobin: 15.1 g/dL (ref 13.2–17.1)
Lymphs Abs: 3938 cells/uL — ABNORMAL HIGH (ref 850–3900)
MCH: 33 pg (ref 27.0–33.0)
MCHC: 34.7 g/dL (ref 32.0–36.0)
MCV: 95.2 fL (ref 80.0–100.0)
MPV: 10.4 fL (ref 7.5–12.5)
Monocytes Relative: 8.5 %
Neutro Abs: 5083 cells/uL (ref 1500–7800)
Neutrophils Relative %: 47.5 %
Platelets: 233 10*3/uL (ref 140–400)
RBC: 4.57 10*6/uL (ref 4.20–5.80)
RDW: 11.8 % (ref 11.0–15.0)
Total Lymphocyte: 36.8 %
WBC: 10.7 10*3/uL (ref 3.8–10.8)

## 2022-06-27 LAB — LIPID PANEL
Cholesterol: 179 mg/dL (ref <200)
HDL: 79 mg/dL (ref >=40)
LDL Cholesterol (Calc): 86 mg/dL (calc)
Non-HDL Cholesterol (Calc): 100 mg/dL (calc) (ref <130)
Total CHOL/HDL Ratio: 2.3 (calc) (ref <5.0)
Triglycerides: 56 mg/dL (ref <150)

## 2022-06-27 LAB — PSA: PSA: 4.07 ng/mL — ABNORMAL HIGH (ref <=4.00)

## 2022-06-30 ENCOUNTER — Ambulatory Visit (INDEPENDENT_AMBULATORY_CARE_PROVIDER_SITE_OTHER): Payer: Medicare Other | Admitting: Nurse Practitioner

## 2022-06-30 ENCOUNTER — Encounter: Payer: Self-pay | Admitting: Nurse Practitioner

## 2022-06-30 VITALS — BP 128/88 | HR 76 | Temp 97.3°F | Ht 70.5 in | Wt 199.4 lb

## 2022-06-30 DIAGNOSIS — M7662 Achilles tendinitis, left leg: Secondary | ICD-10-CM

## 2022-06-30 DIAGNOSIS — Z9109 Other allergy status, other than to drugs and biological substances: Secondary | ICD-10-CM

## 2022-06-30 DIAGNOSIS — K219 Gastro-esophageal reflux disease without esophagitis: Secondary | ICD-10-CM | POA: Diagnosis not present

## 2022-06-30 DIAGNOSIS — R972 Elevated prostate specific antigen [PSA]: Secondary | ICD-10-CM

## 2022-06-30 DIAGNOSIS — F419 Anxiety disorder, unspecified: Secondary | ICD-10-CM

## 2022-06-30 DIAGNOSIS — I10 Essential (primary) hypertension: Secondary | ICD-10-CM | POA: Diagnosis not present

## 2022-06-30 DIAGNOSIS — E782 Mixed hyperlipidemia: Secondary | ICD-10-CM | POA: Diagnosis not present

## 2022-06-30 NOTE — Patient Instructions (Addendum)
Aleve twice daily with food x 5 days To use Voltaren gel.    Xyzal 5 mg by mouth daily for 2 weeks then resume zyrtec if needed  Nettipot daily at night

## 2022-06-30 NOTE — Progress Notes (Signed)
Careteam: Patient Care Team: Lauree Chandler, NP as PCP - General (Geriatric Medicine) Rozetta Nunnery, MD (Inactive) as Consulting Physician (Otolaryngology) Georganna Skeans, MD as Consulting Physician (General Surgery) Rutherford Guys, MD as Consulting Physician (Ophthalmology) Ladene Artist, MD as Consulting Physician (Gastroenterology) Rolm Bookbinder, MD as Consulting Physician (General Surgery) Allyn Kenner, MD (Dermatology) Nehemiah Settle, MD (Gastroenterology)  PLACE OF SERVICE:  [place]  Advanced Directive information    Allergies  Allergen Reactions   Amitriptyline    Zolpidem Tartrate Other (See Comments)    Severe headache   Amoxicillin-Pot Clavulanate Rash   Propoxyphene Hcl Nausea Only    Chief Complaint  Patient presents with   Medical Management of Chronic Issues    Patient presents today for a 1 month follow-up.   Quality Metric Gaps    HIV screening     HPI: Patient is a 66 y.o. male for routine follow up.  In May he started walking, June got serious about it. Now has lump on the back of ankle and has tenderness.   Htn- controlled today.   Has gained weight- wife needing more high calorie food due to health issues- he cooks for her. He tends to do low sodium but not always  Review of Systems:  Review of Systems  Constitutional:  Negative for chills, fever and weight loss.  HENT:  Negative for tinnitus.   Respiratory:  Negative for cough, sputum production and shortness of breath.   Cardiovascular:  Negative for chest pain, palpitations and leg swelling.  Gastrointestinal:  Negative for abdominal pain, constipation, diarrhea and heartburn.  Genitourinary:  Negative for dysuria, frequency and urgency.  Musculoskeletal:  Negative for back pain, falls, joint pain and myalgias.  Skin: Negative.   Neurological:  Negative for dizziness and headaches.  Psychiatric/Behavioral:  Negative for depression and memory loss. The patient does  not have insomnia.     Past Medical History:  Diagnosis Date   Abdominal pain, left lower quadrant    Actinic keratosis    ADD (attention deficit disorder)    Alcohol abuse, unspecified    Anxiety    Anxiety state, unspecified    Arthritis    shoulders and hips   Atrial fibrillation (HCC)    Attention deficit disorder without mention of hyperactivity    Cervicalgia    COVID    Depressive disorder, not elsewhere classified    Eczema 11/25/2011   right hand   Edema    Encounter for long-term (current) use of other medications    Headache(784.0)    tension or sinus   High cholesterol    Inguinal hernia 11/25/2011   right   Inguinal hernia without mention of obstruction or gangrene, unilateral or unspecified, (not specified as recurrent)    Insomnia, unspecified    Internal hemorrhoids without mention of complication    Irritable bowel syndrome    Irritable bowel syndrome (IBS)    Lumbago    Major depressive disorder, recurrent episode, severe, without mention of psychotic behavior    Obesity, unspecified    Other and unspecified hyperlipidemia    Other atopic dermatitis and related conditions    Other disorders of vitreous    Other seborrheic keratosis    Other specified visual disturbances    Pain in joint, pelvic region and thigh    Pain in joint, site unspecified    Pathologic fracture of vertebrae    Poisoning and toxic reactions caused by other specified animals and plants  Recent retinal detachment, partial, with single defect    Routine general medical examination at a health care facility    Sciatica    Sinus infection 11/25/2011   started antibiotic 12/18/2011 x 7 days; current cough   Special screening for malignant neoplasm of prostate    Tension headache    Type II or unspecified type diabetes mellitus without mention of complication, not stated as uncontrolled    Unspecified essential hypertension    Past Surgical History:  Procedure Laterality Date    [place]   CATARACT EXTRACTION  2011   right eye   CATARACT EXTRACTION W/ INTRAOCULAR LENS IMPLANT Left 04/04/2017   Dr. Richardean Sale   EYE SURGERY Right 09/13/2018   Dr.Hanes with [facility]  04/15/14   thrombosed int. Hemorrhoid. Dr. Georganna Skeans   HERNIA REPAIR  05/03/2011   left   INGUINAL HERNIA REPAIR  12/28/2011   Procedure: HERNIA REPAIR INGUINAL ADULT;  Surgeon: Rolm Bookbinder, MD;  Location: [facility];  Service: General;  Laterality: Right;   NASAL POLYP SURGERY  01/05/2011   DR. [facility]    NASAL SINUS SURGERY  12/2010   removed anal warts  1976   Dr Allyson Sabal    RETINAL DETACHMENT REPAIR W/ SCLERAL BUCKLE LE  07/22/2008   right eye; pars plana vitrectomy   TONSILLECTOMY AND ADENOIDECTOMY  1964   TYMPANOSTOMY TUBE PLACEMENT  June 2015   Dr. Loletha Grayer. [place]   Social History:   reports that he quit smoking about 25 years ago. His smoking use included cigarettes. He has never used smokeless tobacco. He reports current alcohol use. He reports that he does not use drugs.  Family History  Problem Relation Age of Onset   Stroke Father    Cancer Brother        prostate   Cancer Paternal Grandfather        colon    Medications: Patient's Medications  New Prescriptions   No medications on file  Previous Medications   AMPHETAMINE-DEXTROAMPHETAMINE (ADDERALL XR) 10 MG 24 HR CAPSULE    Take one tablet once a day for ADD   CETIRIZINE (ZYRTEC) 10 MG TABLET    Take 10 mg by mouth as needed.   CLONAZEPAM (KLONOPIN) 0.5 MG TABLET    TAKE ONE TABLET BY MOUTH EVERY DAY AS NEEDED FOR ANXIETY   DICYCLOMINE (BENTYL) 20 MG TABLET    TAKE 1 TABLET BY MOUTH EVERY 6 HOURS AS NEEDED FOR PAIN OR IRRITABLE BOWEL SYNDROME   DORZOLAMIDEL-TIMOLOL (COSOPT) 22.3-6.8 MG/ML SOLN OPHTHALMIC SOLUTION    Place 1 drop into the right eye daily.   FAMOTIDINE-CALCIUM CARBONATE-MAGNESIUM HYDROXIDE (PEPCID COMPLETE) 10-800-165 MG CHEWABLE TABLET    Chew 1 tablet by  mouth daily as needed.   FLUTICASONE (FLONASE) 50 MCG/ACT NASAL SPRAY    Place 1-2 sprays into the nose daily.   HALOBETASOL (ULTRAVATE) 0.05 % CREAM    APPLY EXTERNALLY TO RASH UP TO THREE TIMES DAILY   LOSARTAN (COZAAR) 50 MG TABLET    TAKE ONE TABLET BY MOUTH EVERY DAY   LOVASTATIN (MEVACOR) 20 MG TABLET    TAKE 1 TABLET(20 MG) BY MOUTH AT BEDTIME   MULTIPLE VITAMIN (MULTIVITAMIN) TABLET    Take 1 tablet by mouth daily.  Modified Medications   No medications on file  Discontinued Medications   DOXYCYCLINE (VIBRA-TABS) 100 MG TABLET    Take 1 tablet (100 mg total) by mouth 2 (two) times daily.  Physical Exam:  Vitals:   06/30/22 1013  BP: 128/88  Pulse: 76  Temp: (!) 97.3 F (36.3 C)  SpO2: 96%  Weight: 199 lb 6.4 oz (90.4 kg)  Height: 5' 10.5" (1.791 m)   Body mass index is 28.21 kg/m. Wt Readings from Last 3 Encounters:  06/30/22 199 lb 6.4 oz (90.4 kg)  04/13/22 193 lb 6.4 oz (87.7 kg)  03/21/22 195 lb 12.8 oz (88.8 kg)    Physical Exam Constitutional:      General: He is not in acute distress.    Appearance: He is well-developed. He is not diaphoretic.  HENT:     Head: Normocephalic and atraumatic.     Right Ear: External ear normal.     Left Ear: External ear normal.     Mouth/Throat:     Pharynx: No oropharyngeal exudate.  Eyes:     Conjunctiva/sclera: Conjunctivae normal.     Pupils: Pupils are equal, round, and reactive to light.  Cardiovascular:     Rate and Rhythm: Normal rate and regular rhythm.     Heart sounds: Normal heart sounds.  Pulmonary:     Effort: Pulmonary effort is normal.     Breath sounds: Normal breath sounds.  Abdominal:     General: Bowel sounds are normal.     Palpations: Abdomen is soft.  Musculoskeletal:     Cervical back: Normal range of motion and neck supple.     Right lower leg: No edema.     Left lower leg: No edema.     Left ankle:     Left Achilles Tendon: Tenderness present.  Skin:    General: Skin is warm and  dry.  Neurological:     Mental Status: He is alert and oriented to person, place, and time.     Labs reviewed: Basic Metabolic Panel: Recent Labs    07/27/21 0933 11/28/21 1015 06/26/22 0834  NA 139 140 140  K 4.1 4.2 4.7  CL 101 101 103  CO2 '24 28 28  ' GLUCOSE 84 108 111*  BUN '15 17 14  ' CREATININE 0.91 0.99 1.13  CALCIUM 9.2 8.9 9.8   Liver Function Tests: Recent Labs    11/28/21 1015 06/26/22 0834  AST 24 26  ALT 34 33  BILITOT 0.5 0.5  PROT 6.7 6.5   No results for input(s): "LIPASE", "AMYLASE" in the last 8760 hours. No results for input(s): "AMMONIA" in the last 8760 hours. CBC: Recent Labs    11/28/21 1015 06/26/22 0834  WBC 9.8 10.7  NEUTROABS 6,497 5,083  HGB 14.3 15.1  HCT 42.7 43.5  MCV 97.0 95.2  PLT 210 233   Lipid Panel: Recent Labs    11/28/21 1015 06/26/22 0834  CHOL 184 179  HDL 83 79  LDLCALC 85 86  TRIG 72 56  CHOLHDL 2.2 2.3   TSH: No results for input(s): "TSH" in the last 8760 hours. A1C: Lab Results  Component Value Date   HGBA1C 5.3 12/01/2020     Assessment/Plan 1. Elevated PSA -no symptoms of incomplete bladder emptying, nocturia, frequency at this time. Will monitor PSC - PSA; Future  2. Benign essential HTN -Blood pressure well controlled Continue current medications follow metabolic panel - CMP with eGFR([facility]); Future  3. Anxiety -stable at this time.  4. Gastroesophageal reflux disease without esophagitis Controlled on pepcid and diet  5. Mixed hyperlipidemia -stable, continues on lovastatin - CMP with eGFR([facility]); Future  6. Achilles tendinitis of left lower extremity -Aleve twice  daily with food x 5 days To use Voltaren gel PRN -ice 2-3 times daily ~20 mins.  7. Environmental allergies -ongoing. Nettipot daily in the evening.  -can use xyzal 5 mg daily for 2 weeks and then resume zyrtec  Continue flonase   Return in about 6 months (around 12/31/2022) for routine follow up, labs prior to  appt. Carlos American. [place], [facility] [phone]

## 2022-07-05 ENCOUNTER — Ambulatory Visit (INDEPENDENT_AMBULATORY_CARE_PROVIDER_SITE_OTHER): Payer: Medicare Other | Admitting: Family

## 2022-07-05 ENCOUNTER — Encounter: Payer: Self-pay | Admitting: Family

## 2022-07-05 VITALS — BP 154/90 | HR 83 | Temp 97.5°F | Resp 16 | Ht 70.5 in | Wt 197.2 lb

## 2022-07-05 DIAGNOSIS — K5730 Diverticulosis of large intestine without perforation or abscess without bleeding: Secondary | ICD-10-CM | POA: Diagnosis not present

## 2022-07-05 LAB — CBC WITH DIFFERENTIAL/PLATELET
Absolute Monocytes: 1000 cells/uL — ABNORMAL HIGH (ref 200–950)
Basophils Absolute: 38 cells/uL (ref 0–200)
Basophils Relative: 0.3 %
Eosinophils Absolute: 275 cells/uL (ref 15–500)
Eosinophils Relative: 2.2 %
HCT: 41.1 % (ref 38.5–50.0)
Hemoglobin: 14.4 g/dL (ref 13.2–17.1)
Lymphs Abs: 2625 cells/uL (ref 850–3900)
MCH: 33.1 pg — ABNORMAL HIGH (ref 27.0–33.0)
MCHC: 35 g/dL (ref 32.0–36.0)
MCV: 94.5 fL (ref 80.0–100.0)
MPV: 10.8 fL (ref 7.5–12.5)
Monocytes Relative: 8 %
Neutro Abs: 8563 cells/uL — ABNORMAL HIGH (ref 1500–7800)
Neutrophils Relative %: 68.5 %
Platelets: 211 10*3/uL (ref 140–400)
RBC: 4.35 10*6/uL (ref 4.20–5.80)
RDW: 11.7 % (ref 11.0–15.0)
Total Lymphocyte: 21 %
WBC: 12.5 10*3/uL — ABNORMAL HIGH (ref 3.8–10.8)

## 2022-07-05 MED ORDER — CIPROFLOXACIN HCL 500 MG PO TABS: 500.0000 mg | ORAL_TABLET | Freq: Two times a day (BID) | ORAL | 0 refills | Status: AC

## 2022-07-05 MED ORDER — METRONIDAZOLE 500 MG PO TABS: 500.0000 mg | ORAL_TABLET | Freq: Three times a day (TID) | ORAL | 0 refills | Status: AC

## 2022-07-05 NOTE — Progress Notes (Signed)
Provider: Rosalita Carey FNP-C  Sharon Seller, NP  Patient Care Team: Sharon Seller, NP as PCP - General (Geriatric Medicine) Drema Halon, MD (Inactive) as Consulting Physician (Otolaryngology) Violeta Gelinas, MD as Consulting Physician (General Surgery) Jethro Bolus, MD as Consulting Physician (Ophthalmology) Meryl Dare, MD as Consulting Physician (Gastroenterology) Emelia Loron, MD as Consulting Physician (General Surgery) Nita Sells, MD (Dermatology) Webb Silversmith, MD (Gastroenterology)  Extended Emergency Contact Information Primary Emergency Contact: Lashway,Lynn Address: [address]          [place], [place] [address] [place] of [place] Home Phone: [phone] Mobile Phone: [phone] Relation: Spouse  Code Status:  Full Code  Goals of care: Advanced Directive information    07/05/2022   10:40 AM  Advanced Directives  Does Patient Have a Medical Advance Directive? Yes  Type of Estate agent of [place];Living will;Out of facility DNR (pink MOST or yellow form)  Does patient want to make changes to medical advance directive? No - Patient declined  Copy of Healthcare Power of Attorney in Chart? No - copy requested     Chief Complaint  Patient presents with   Acute Visit    Patient complains of possible diverticulitis flare up. Patient symptoms are bloating after eating, gas, and upper abdominal pain that started Monday 07/03/2022    HPI:  Pt is a 66 y.o. male seen today for an acute visit for evaluation of left abdominal pain x 3 days.Had dinner on Monday after an hour felt bloated. Took dicyclomine with much improvement but symptoms flared up again the following day without any relief.His Temp was 101 degrees.Had bloating,gas and painful with breathing and leaning forward.Oral Temp today was 99.0  He denies any nausea,vomiting, cough,diarrhea,constipation or blood in the stool.     Past Medical  History:  Diagnosis Date   Abdominal pain, left lower quadrant    Actinic keratosis    ADD (attention deficit disorder)    Alcohol abuse, unspecified    Anxiety    Anxiety state, unspecified    Arthritis    shoulders and hips   Atrial fibrillation (HCC)    Attention deficit disorder without mention of hyperactivity    Cervicalgia    COVID    Depressive disorder, not elsewhere classified    Eczema 11/25/2011   right hand   Edema    Encounter for long-term (current) use of other medications    Headache(784.0)    tension or sinus   High cholesterol    Inguinal hernia 11/25/2011   right   Inguinal hernia without mention of obstruction or gangrene, unilateral or unspecified, (not specified as recurrent)    Insomnia, unspecified    Internal hemorrhoids without mention of complication    Irritable bowel syndrome    Irritable bowel syndrome (IBS)    Lumbago    Major depressive disorder, recurrent episode, severe, without mention of psychotic behavior    Obesity, unspecified    Other and unspecified hyperlipidemia    Other atopic dermatitis and related conditions    Other disorders of vitreous    Other seborrheic keratosis    Other specified visual disturbances    Pain in joint, pelvic region and thigh    Pain in joint, site unspecified    Pathologic fracture of vertebrae    Poisoning and toxic reactions caused by other specified animals and plants    Recent retinal detachment, partial, with single defect    Routine general medical examination at a  health care facility    Sciatica    Sinus infection 11/25/2011   started antibiotic 12/18/2011 x 7 days; current cough   Special screening for malignant neoplasm of prostate    Tension headache    Type II or unspecified type diabetes mellitus without mention of complication, not stated as uncontrolled    Unspecified essential hypertension    Past Surgical History:  Procedure Laterality Date   APPENDECTOMY  1987   CATARACT  EXTRACTION  2011   right eye   CATARACT EXTRACTION W/ INTRAOCULAR LENS IMPLANT Left 04/04/2017   Dr. Dawna Part   EYE SURGERY Right 09/13/2018   Dr.Hanes with [facility]    HEMORRHOID SURGERY  04/15/14   thrombosed int. Hemorrhoid. Dr. Violeta Gelinas   HERNIA REPAIR  05/03/2011   left   INGUINAL HERNIA REPAIR  12/28/2011   Procedure: HERNIA REPAIR INGUINAL ADULT;  Surgeon: Emelia Loron, MD;  Location: [facility];  Service: General;  Laterality: Right;   NASAL POLYP SURGERY  01/05/2011   DR. [facility]    NASAL SINUS SURGERY  12/2010   removed anal warts  1976   Dr Terri Piedra    RETINAL DETACHMENT REPAIR W/ SCLERAL BUCKLE LE  07/22/2008   right eye; pars plana vitrectomy   TONSILLECTOMY AND ADENOIDECTOMY  1964   TYMPANOSTOMY TUBE PLACEMENT  June 2015   Dr. Salena Saner. [place]    Allergies  Allergen Reactions   Amitriptyline    Zolpidem Tartrate Other (See Comments)    Severe headache   Amoxicillin-Pot Clavulanate Rash   Propoxyphene Hcl Nausea Only    Outpatient Encounter Medications as of 07/05/2022  Medication Sig   amphetamine-dextroamphetamine (ADDERALL XR) 10 MG 24 hr capsule Take one tablet once a day for ADD   cetirizine (ZYRTEC) 10 MG tablet Take 10 mg by mouth as needed.   clonazePAM (KLONOPIN) 0.5 MG tablet TAKE ONE TABLET BY MOUTH EVERY DAY AS NEEDED FOR ANXIETY   dicyclomine (BENTYL) 20 MG tablet TAKE 1 TABLET BY MOUTH EVERY 6 HOURS AS NEEDED FOR PAIN OR IRRITABLE BOWEL SYNDROME   dorzolamidel-timolol (COSOPT) 22.3-6.8 MG/ML SOLN ophthalmic solution Place 1 drop into the right eye daily.   famotidine-calcium carbonate-magnesium hydroxide (PEPCID COMPLETE) 10-800-165 MG chewable tablet Chew 1 tablet by mouth daily as needed.   fluticasone (FLONASE) 50 MCG/ACT nasal spray Place 1-2 sprays into the nose daily.   halobetasol (ULTRAVATE) 0.05 % cream APPLY EXTERNALLY TO RASH UP TO THREE TIMES DAILY   losartan (COZAAR) 50 MG tablet TAKE ONE TABLET BY MOUTH EVERY DAY    lovastatin (MEVACOR) 20 MG tablet TAKE 1 TABLET(20 MG) BY MOUTH AT BEDTIME   Multiple Vitamin (MULTIVITAMIN) tablet Take 1 tablet by mouth daily.   No facility-administered encounter medications on file as of 07/05/2022.    Review of Systems  Constitutional:  Negative for appetite change, chills, fatigue and fever.  Respiratory:  Negative for cough, chest tightness, shortness of breath and wheezing.   Cardiovascular:  Negative for chest pain, palpitations and leg swelling.  Gastrointestinal:  Positive for abdominal pain. Negative for abdominal distention, blood in stool, constipation, diarrhea, nausea and vomiting.  Genitourinary:  Negative for difficulty urinating, dysuria, flank pain, frequency, hematuria and urgency.  Musculoskeletal:  Negative for arthralgias, back pain and gait problem.  Skin:  Negative for color change and pallor.    Immunization History  Administered Date(s) Administered   DTaP 05/31/2006   Influenza,inj,Quad PF,6+ Mos 09/09/2019, 09/21/2020, 10/28/2021   Influenza-Unspecified 10/06/2014, 09/28/2015, 09/25/2016, 09/25/2017, 09/25/2018  PFIZER(Purple Top)SARS-COV-2 Vaccination 03/18/2020, 04/12/2020, 10/21/2020   Pfizer Covid-19 Vaccine Bivalent Booster 41yrs & up 09/13/2021   Tdap 10/25/2013   Zoster Recombinat (Shingrix) 10/20/2019, 12/29/2019   Pertinent  Health Maintenance Due  Topic Date Due   INFLUENZA VACCINE  07/25/2022   COLONOSCOPY (Pts 45-16yrs Insurance coverage will need to be confirmed)  02/07/2028      11/28/2021    9:40 AM 01/18/2022   10:24 AM 03/21/2022    1:28 PM 06/30/2022   10:12 AM 07/05/2022   10:40 AM  Fall Risk  Falls in the past year? 1 1 0 0 0  Was there an injury with Fall? 1 1 0 0 0  Was there an injury with Fall? - Comments right foot injury      Fall Risk Category Calculator 2 2 0 0 0  Fall Risk Category Moderate Moderate Low Low Low  Patient Fall Risk Level Moderate fall risk Moderate fall risk Low fall risk Low fall risk  Low fall risk  Patient at Risk for Falls Due to History of fall(s) History of fall(s) No Fall Risks No Fall Risks No Fall Risks  Fall risk Follow up Falls evaluation completed Falls evaluation completed;Education provided;Falls prevention discussed Falls evaluation completed Falls evaluation completed Falls evaluation completed   Functional Status Survey:    Vitals:   07/05/22 1035  BP: (!) 154/90  Pulse: 83  Resp: 16  Temp: (!) 97.5 F (36.4 C)  SpO2: 98%  Weight: 197 lb 3.2 oz (89.4 kg)  Height: 5' 10.5" (1.791 m)   Body mass index is 27.9 kg/m. Physical Exam Vitals reviewed.  Constitutional:      General: He is not in acute distress.    Appearance: Normal appearance. He is normal weight. He is not ill-appearing or diaphoretic.  HENT:     Head: Normocephalic.  Eyes:     General: No scleral icterus.       Right eye: No discharge.        Left eye: No discharge.     Conjunctiva/sclera: Conjunctivae normal.     Pupils: Pupils are equal, round, and reactive to light.  Neck:     Vascular: No carotid bruit.  Cardiovascular:     Rate and Rhythm: Normal rate and regular rhythm.     Pulses: Normal pulses.     Heart sounds: Normal heart sounds. No murmur heard.    No friction rub. No gallop.  Pulmonary:     Effort: Pulmonary effort is normal. No respiratory distress.     Breath sounds: Normal breath sounds. No wheezing, rhonchi or rales.  Chest:     Chest wall: No tenderness.  Abdominal:     General: Bowel sounds are normal. There is no distension.     Palpations: Abdomen is soft. There is no mass.     Tenderness: There is abdominal tenderness in the left upper quadrant and left lower quadrant. There is no right CVA tenderness, left CVA tenderness, guarding or rebound.  Musculoskeletal:     Cervical back: Normal range of motion. No rigidity or tenderness.  Lymphadenopathy:     Cervical: No cervical adenopathy.  Skin:    General: Skin is warm and dry.     Coloration: Skin  is not pale.     Findings: No bruising, erythema, lesion or rash.  Neurological:     Mental Status: He is alert and oriented to person, place, and time.     Gait: Gait normal.  Psychiatric:  Mood and Affect: Mood normal.        Speech: Speech normal.        Behavior: Behavior normal.    Labs reviewed: Recent Labs    07/27/21 0933 11/28/21 1015 06/26/22 0834  NA 139 140 140  K 4.1 4.2 4.7  CL 101 101 103  CO2 24 28 28   GLUCOSE 84 108 111*  BUN 15 17 14   CREATININE 0.91 0.99 1.13  CALCIUM 9.2 8.9 9.8   Recent Labs    11/28/21 1015 06/26/22 0834  AST 24 26  ALT 34 33  BILITOT 0.5 0.5  PROT 6.7 6.5   Recent Labs    11/28/21 1015 06/26/22 0834  WBC 9.8 10.7  NEUTROABS 6,497 5,083  HGB 14.3 15.1  HCT 42.7 43.5  MCV 97.0 95.2  PLT 210 233   Lab Results  Component Value Date   TSH 1.41 06/08/2021   Lab Results  Component Value Date   HGBA1C 5.3 12/01/2020   Lab Results  Component Value Date   CHOL 179 06/26/2022   HDL 79 06/26/2022   LDLCALC 86 06/26/2022   TRIG 56 06/26/2022   CHOLHDL 2.3 06/26/2022    Significant Diagnostic Results in last 30 days:  No results found.  Assessment/Plan   Diverticulosis of colon without hemorrhage Left lower abdominal tenderness to palpation -Has had similar symptoms in the past with diverticulosis antibiotics ineffective. -Start on Cipro and Flagyl as below -Advised to go to ED if symptoms worsen - CBC with Differential/Platelet - ciprofloxacin (CIPRO) 500 MG tablet; Take 1 tablet (500 mg total) by mouth 2 (two) times daily for 10 days.  Dispense: 20 tablet; Refill: 0 - metroNIDAZOLE (FLAGYL) 500 MG tablet; Take 1 tablet (500 mg total) by mouth 3 (three) times daily for 7 days.  Dispense: 21 tablet; Refill: 0  Family/ staff Communication: Reviewed plan of care with patient verbalized understanding  Labs/tests ordered: None   Next Appointment: Return if symptoms worsen or fail to improve.   08/27/2022, NP

## 2022-07-05 NOTE — Patient Instructions (Signed)
-  Notify provider or go to ED if symptoms worsen or fail to improve.  

## 2022-07-20 ENCOUNTER — Other Ambulatory Visit: Payer: Self-pay | Admitting: *Deleted

## 2022-07-20 DIAGNOSIS — F902 Attention-deficit hyperactivity disorder, combined type: Secondary | ICD-10-CM

## 2022-07-20 MED ORDER — AMPHETAMINE-DEXTROAMPHET ER 10 MG PO CP24: ORAL_CAPSULE | ORAL | 0 refills | Status: DC

## 2022-07-20 NOTE — Telephone Encounter (Signed)
Patient requested refill.  Epic LR: 05/08/2022 Contract Date: 06/08/2021-note added to upcoming appointment to update.   Pended Rx and sent to [facility] for approval.

## 2022-07-27 ENCOUNTER — Encounter: Payer: Self-pay | Admitting: Nurse Practitioner

## 2022-07-28 ENCOUNTER — Other Ambulatory Visit: Payer: Self-pay | Admitting: Nurse Practitioner

## 2022-07-28 DIAGNOSIS — F419 Anxiety disorder, unspecified: Secondary | ICD-10-CM

## 2022-07-28 NOTE — Telephone Encounter (Signed)
Patient has request refill on medication Clonazepam 0.5mg . Patient last refill dated 04/24/2022. Patient has Non Opioid Contract on file dated 06/08/2021. Patient has upcoming appointment 01/01/2023. Sign contract is on patient appointment note. Medication pend and sent to PCP Janyth Contes Janene Harvey, NP for approval.

## 2022-08-22 ENCOUNTER — Other Ambulatory Visit: Payer: Self-pay

## 2022-08-22 DIAGNOSIS — F902 Attention-deficit hyperactivity disorder, combined type: Secondary | ICD-10-CM

## 2022-08-22 MED ORDER — AMPHETAMINE-DEXTROAMPHET ER 10 MG PO CP24: ORAL_CAPSULE | ORAL | 0 refills | Status: DC

## 2022-08-22 NOTE — Telephone Encounter (Signed)
Patient called requesting refill on adderall. Patient would like medication sent to [facility].Patient needs to update contract. Patient states that [facility] pharmacy is the only pharmacy that has it at this time.  Medication pended and sent to [name], NP

## 2022-09-20 ENCOUNTER — Encounter: Payer: Self-pay | Admitting: Nurse Practitioner

## 2022-10-19 ENCOUNTER — Other Ambulatory Visit: Payer: Self-pay

## 2022-10-19 DIAGNOSIS — F902 Attention-deficit hyperactivity disorder, combined type: Secondary | ICD-10-CM

## 2022-10-19 MED ORDER — AMPHETAMINE-DEXTROAMPHET ER 10 MG PO CP24: ORAL_CAPSULE | ORAL | 0 refills | Status: DC

## 2022-10-19 NOTE — Telephone Encounter (Signed)
Patient is requesting a refill of the following medications: Requested Prescriptions   Pending Prescriptions Disp Refills   amphetamine-dextroamphetamine (ADDERALL XR) 10 MG 24 hr capsule 30 capsule 0    Sig: Take one tablet once a day for ADD    Date of last refill: 08/22/2022  Refill amount: 30  Treatment agreement date: Outdated, notation made on pending appointment for Jan 2024

## 2022-10-24 ENCOUNTER — Other Ambulatory Visit: Payer: Self-pay | Admitting: *Deleted

## 2022-10-24 DIAGNOSIS — E785 Hyperlipidemia, unspecified: Secondary | ICD-10-CM

## 2022-10-24 DIAGNOSIS — I10 Essential (primary) hypertension: Secondary | ICD-10-CM

## 2022-10-24 MED ORDER — LOVASTATIN 20 MG PO TABS: ORAL_TABLET | ORAL | 1 refills | Status: DC

## 2022-10-24 MED ORDER — LOSARTAN POTASSIUM 50 MG PO TABS: 50.0000 mg | ORAL_TABLET | Freq: Every day | ORAL | 1 refills | Status: DC

## 2022-10-24 NOTE — Telephone Encounter (Signed)
Pharmacy requested refill

## 2022-10-24 NOTE — Addendum Note (Signed)
Addended by: Rafael Bihari A on: 10/24/2022 03:14 PM   Modules accepted: Orders

## 2022-10-31 DIAGNOSIS — H26493 Other secondary cataract, bilateral: Secondary | ICD-10-CM | POA: Diagnosis not present

## 2022-10-31 DIAGNOSIS — H338 Other retinal detachments: Secondary | ICD-10-CM | POA: Diagnosis not present

## 2022-10-31 DIAGNOSIS — Z961 Presence of intraocular lens: Secondary | ICD-10-CM | POA: Diagnosis not present

## 2022-11-13 ENCOUNTER — Other Ambulatory Visit: Payer: Self-pay | Admitting: Nurse Practitioner

## 2022-11-13 DIAGNOSIS — F419 Anxiety disorder, unspecified: Secondary | ICD-10-CM

## 2022-11-24 DIAGNOSIS — H40021 Open angle with borderline findings, high risk, right eye: Secondary | ICD-10-CM | POA: Diagnosis not present

## 2022-11-27 ENCOUNTER — Telehealth: Payer: Self-pay

## 2022-11-27 DIAGNOSIS — F902 Attention-deficit hyperactivity disorder, combined type: Secondary | ICD-10-CM

## 2022-11-27 MED ORDER — AMPHETAMINE-DEXTROAMPHET ER 10 MG PO CP24
ORAL_CAPSULE | ORAL | 0 refills | Status: DC
Start: 2022-11-27 — End: 2022-12-29

## 2022-11-27 NOTE — Telephone Encounter (Signed)
Patient is requesting a refill of the following medications: Requested Prescriptions   Pending Prescriptions Disp Refills   amphetamine-dextroamphetamine (ADDERALL XR) 10 MG 24 hr capsule 30 capsule 0    Sig: Take one tablet once a day for ADD    Date of last refill: 10/19/2022  Refill amount: 30  Treatment agreement date: 06/08/2021, notation already made on pending appointment for 01/01/2023

## 2022-12-28 ENCOUNTER — Other Ambulatory Visit: Payer: Medicare Other

## 2022-12-28 DIAGNOSIS — I10 Essential (primary) hypertension: Secondary | ICD-10-CM | POA: Diagnosis not present

## 2022-12-28 DIAGNOSIS — E782 Mixed hyperlipidemia: Secondary | ICD-10-CM

## 2022-12-28 DIAGNOSIS — R972 Elevated prostate specific antigen [PSA]: Secondary | ICD-10-CM

## 2022-12-29 ENCOUNTER — Other Ambulatory Visit: Payer: Self-pay | Admitting: *Deleted

## 2022-12-29 DIAGNOSIS — F902 Attention-deficit hyperactivity disorder, combined type: Secondary | ICD-10-CM

## 2022-12-29 LAB — COMPLETE METABOLIC PANEL WITH GFR
AG Ratio: 2 (calc) (ref 1.0–2.5)
ALT: 45 U/L (ref 9–46)
AST: 36 U/L — ABNORMAL HIGH (ref 10–35)
Albumin: 4.7 g/dL (ref 3.6–5.1)
Alkaline phosphatase (APISO): 65 U/L (ref 35–144)
BUN: 19 mg/dL (ref 7–25)
CO2: 28 mmol/L (ref 20–32)
Calcium: 9.8 mg/dL (ref 8.6–10.3)
Chloride: 100 mmol/L (ref 98–110)
Creat: 0.96 mg/dL (ref 0.70–1.35)
Globulin: 2.3 g/dL (calc) (ref 1.9–3.7)
Glucose, Bld: 110 mg/dL — ABNORMAL HIGH (ref 65–99)
Potassium: 4.4 mmol/L (ref 3.5–5.3)
Sodium: 138 mmol/L (ref 135–146)
Total Bilirubin: 0.8 mg/dL (ref 0.2–1.2)
Total Protein: 7 g/dL (ref 6.1–8.1)
eGFR: 88 mL/min/{1.73_m2} (ref >=60)

## 2022-12-29 LAB — PSA: PSA: 3.94 ng/mL (ref <=4.00)

## 2022-12-29 MED ORDER — AMPHETAMINE-DEXTROAMPHET ER 10 MG PO CP24: ORAL_CAPSULE | ORAL | 0 refills | Status: DC

## 2022-12-29 NOTE — Telephone Encounter (Signed)
Patient requested refill. Epic LR: 11/27/2022 Contract Date: 06/08/2021 Note added to upcoming appointment to update.   Pended Rx and sent to [facility] for approval.

## 2023-01-01 ENCOUNTER — Encounter: Payer: Self-pay | Admitting: Nurse Practitioner

## 2023-01-01 ENCOUNTER — Ambulatory Visit (INDEPENDENT_AMBULATORY_CARE_PROVIDER_SITE_OTHER): Payer: Medicare Other | Admitting: Nurse Practitioner

## 2023-01-01 VITALS — BP 136/80 | HR 89 | Temp 97.9°F | Ht 70.08 in | Wt 204.0 lb

## 2023-01-01 DIAGNOSIS — K580 Irritable bowel syndrome with diarrhea: Secondary | ICD-10-CM | POA: Diagnosis not present

## 2023-01-01 DIAGNOSIS — L309 Dermatitis, unspecified: Secondary | ICD-10-CM | POA: Diagnosis not present

## 2023-01-01 DIAGNOSIS — I10 Essential (primary) hypertension: Secondary | ICD-10-CM | POA: Diagnosis not present

## 2023-01-01 DIAGNOSIS — F419 Anxiety disorder, unspecified: Secondary | ICD-10-CM

## 2023-01-01 DIAGNOSIS — E782 Mixed hyperlipidemia: Secondary | ICD-10-CM | POA: Diagnosis not present

## 2023-01-01 DIAGNOSIS — R972 Elevated prostate specific antigen [PSA]: Secondary | ICD-10-CM

## 2023-01-01 DIAGNOSIS — F902 Attention-deficit hyperactivity disorder, combined type: Secondary | ICD-10-CM

## 2023-01-01 DIAGNOSIS — M7662 Achilles tendinitis, left leg: Secondary | ICD-10-CM

## 2023-01-01 DIAGNOSIS — Z9109 Other allergy status, other than to drugs and biological substances: Secondary | ICD-10-CM

## 2023-01-01 MED ORDER — DICYCLOMINE HCL 20 MG PO TABS: ORAL_TABLET | ORAL | 3 refills | Status: DC

## 2023-01-01 MED ORDER — HALOBETASOL PROPIONATE 0.05 % EX CREA: TOPICAL_CREAM | CUTANEOUS | 1 refills | Status: DC

## 2023-01-01 NOTE — Progress Notes (Unsigned)
Careteam: Patient Care Team: Sharon Seller, NP as PCP - General (Geriatric Medicine) Drema Halon, MD (Inactive) as Consulting Physician (Otolaryngology) Violeta Gelinas, MD as Consulting Physician (General Surgery) Jethro Bolus, MD as Consulting Physician (Ophthalmology) Meryl Dare, MD as Consulting Physician (Gastroenterology) Emelia Loron, MD as Consulting Physician (General Surgery) Nita Sells, MD (Dermatology) Webb Silversmith, MD (Gastroenterology)  PLACE OF SERVICE:  [facility]  Advanced Directive information Does Patient Have a Medical Advance Directive?: Yes, Type of Advance Directive: Healthcare Power of [place];Living will, Does patient want to make changes to medical advance directive?: No - Patient declined  Allergies  Allergen Reactions   Amitriptyline    Zolpidem Tartrate Other (See Comments)    Severe headache   Amoxicillin-Pot Clavulanate Rash   Propoxyphene Hcl Nausea Only    Chief Complaint  Patient presents with   Medical Management of Chronic Issues    6 month follow-up and update treatment agreement. Discuss need for pneumonia vaccine, hiv screening, and additional covid boosters or post pone if patient refuses. [facility] verified      HPI: Patient is a 67 y.o. male for routine follow up   Has been having IBS symptoms- normal BM in the morning and then after lunch would have diarrhea. This will happen a few times a week.  Last week had episodes but seems to be getting better.   [place]   Anxiety- ongoing, using klonopin every few days. Will use if wakes up and can not go back to sleep.  Continues to use adderall 5-6 times a weeks- no side effects, helps calm   Has achilles tendonitis- has good supportive shoes.  Got a band and it makes him hurt more.  Taking aleve and uses stretches  Has been going on since June.     Review of Systems:  Review of Systems  Constitutional:   Negative for chills, fever and weight loss.  HENT:  Negative for tinnitus.   Respiratory:  Negative for cough, sputum production and shortness of breath.   Cardiovascular:  Negative for chest pain, palpitations and leg swelling.  Gastrointestinal:  Negative for abdominal pain, constipation, diarrhea and heartburn.  Genitourinary:  Negative for dysuria, frequency and urgency.  Musculoskeletal:  Negative for back pain, falls, joint pain and myalgias.  Skin: Negative.   Neurological:  Negative for dizziness and headaches.  Psychiatric/Behavioral:  Negative for depression and memory loss. The patient does not have insomnia.   ***  Past Medical History:  Diagnosis Date   Abdominal pain, left lower quadrant    Actinic keratosis    ADD (attention deficit disorder)    Alcohol abuse, unspecified    Anxiety    Anxiety state, unspecified    Arthritis    shoulders and hips   Atrial fibrillation (HCC)    Attention deficit disorder without mention of hyperactivity    Cervicalgia    COVID    Depressive disorder, not elsewhere classified    Eczema 11/25/2011   right hand   Edema    Encounter for long-term (current) use of other medications    Glaucoma    Right eye   Headache(784.0)    tension or sinus   High cholesterol    Inguinal hernia 11/25/2011   right   Inguinal hernia without mention of obstruction or gangrene, unilateral or unspecified, (not specified as recurrent)    Insomnia, unspecified    Internal hemorrhoids without mention of  complication    Irritable bowel syndrome    Irritable bowel syndrome (IBS)    Lumbago    Major depressive disorder, recurrent episode, severe, without mention of psychotic behavior    Obesity, unspecified    Other and unspecified hyperlipidemia    Other atopic dermatitis and related conditions    Other disorders of vitreous    Other seborrheic keratosis    Other specified visual disturbances    Pain in joint, pelvic region and thigh    Pain in  joint, site unspecified    Pathologic fracture of vertebrae    Poisoning and toxic reactions caused by other specified animals and plants    Recent retinal detachment, partial, with single defect    Routine general medical examination at a health care facility    Sciatica    Sinus infection 11/25/2011   started antibiotic 12/18/2011 x 7 days; current cough   Special screening for malignant neoplasm of prostate    Tension headache    Type II or unspecified type diabetes mellitus without mention of complication, not stated as uncontrolled    Unspecified essential hypertension    Past Surgical History:  Procedure Laterality Date   [place]   CATARACT EXTRACTION  2011   right eye   CATARACT EXTRACTION W/ INTRAOCULAR LENS IMPLANT Left 04/04/2017   Dr. Richardean Sale   EYE SURGERY Right 09/13/2018   Dr.Hanes with [facility]  04/15/14   thrombosed int. Hemorrhoid. Dr. Georganna Skeans   HERNIA REPAIR  05/03/2011   left   INGUINAL HERNIA REPAIR  12/28/2011   Procedure: HERNIA REPAIR INGUINAL ADULT;  Surgeon: Rolm Bookbinder, MD;  Location: [place];  Service: General;  Laterality: Right;   NASAL POLYP SURGERY  01/05/2011   DR. [facility]    NASAL SINUS SURGERY  12/2010   removed anal warts  1976   Dr Allyson Sabal    RETINAL DETACHMENT REPAIR W/ SCLERAL BUCKLE LE  07/22/2008   right eye; pars plana vitrectomy   TONSILLECTOMY AND ADENOIDECTOMY  1964   TYMPANOSTOMY TUBE PLACEMENT  June 2015   Dr. Loletha Grayer. [place]   Social History:   reports that he quit smoking about 25 years ago. His smoking use included cigarettes. He has never used smokeless tobacco. He reports current alcohol use. He reports that he does not use drugs.  Family History  Problem Relation Age of Onset   Stroke Father    Cancer Brother        prostate   Cancer Paternal Grandfather        colon    Medications: Patient's Medications  New Prescriptions   No medications on file  Previous  Medications   AMPHETAMINE-DEXTROAMPHETAMINE (ADDERALL XR) 10 MG 24 HR CAPSULE    Take one tablet once a day for ADD   CETIRIZINE (ZYRTEC) 10 MG TABLET    Take 10 mg by mouth as needed.   CLONAZEPAM (KLONOPIN) 0.5 MG TABLET    TAKE ONE TABLET BY MOUTH EVERY DAY AS NEEDED FOR ANXIETY   DICYCLOMINE (BENTYL) 20 MG TABLET    TAKE 1 TABLET BY MOUTH EVERY 6 HOURS AS NEEDED FOR PAIN OR IRRITABLE BOWEL SYNDROME   FAMOTIDINE-CALCIUM CARBONATE-MAGNESIUM HYDROXIDE (PEPCID COMPLETE) 10-800-165 MG CHEWABLE TABLET    Chew 1 tablet by mouth daily as needed.   HALOBETASOL (ULTRAVATE) 0.05 % CREAM    APPLY EXTERNALLY TO RASH UP TO THREE TIMES DAILY   LATANOPROST (XALATAN) 0.005 % OPHTHALMIC SOLUTION  Place 1 drop into the right eye at bedtime.   LOSARTAN (COZAAR) 50 MG TABLET    Take 1 tablet (50 mg total) by mouth daily.   LOVASTATIN (MEVACOR) 20 MG TABLET    TAKE 1 TABLET(20 MG) BY MOUTH AT BEDTIME   MULTIPLE VITAMIN (MULTIVITAMIN) TABLET    Take 1 tablet by mouth daily.  Modified Medications   No medications on file  Discontinued Medications   DORZOLAMIDEL-TIMOLOL (COSOPT) 22.3-6.8 MG/ML SOLN OPHTHALMIC SOLUTION    Place 1 drop into the right eye daily.   FLUTICASONE (FLONASE) 50 MCG/ACT NASAL SPRAY    Place 1-2 sprays into the nose daily.    Physical Exam:  Vitals:   01/01/23 1000  BP: 136/80  Pulse: 89  Temp: 97.9 F (36.6 C)  TempSrc: Temporal  SpO2: 97%  Weight: 204 lb (92.5 kg)  Height: 5' 10.08" (1.78 m)   Body mass index is 29.21 kg/m. Wt Readings from Last 3 Encounters:  01/01/23 204 lb (92.5 kg)  07/05/22 197 lb 3.2 oz (89.4 kg)  06/30/22 199 lb 6.4 oz (90.4 kg)    Physical Exam***  Labs reviewed: Basic Metabolic Panel: Recent Labs    06/26/22 0834 12/28/22 0824  NA 140 138  K 4.7 4.4  CL 103 100  CO2 28 28  GLUCOSE 111* 110*  BUN 14 19  CREATININE 1.13 0.96  CALCIUM 9.8 9.8   Liver Function Tests: Recent Labs    06/26/22 0834 12/28/22 0824  AST 26 36*  ALT  33 45  BILITOT 0.5 0.8  PROT 6.5 7.0   No results for input(s): "LIPASE", "AMYLASE" in the last 8760 hours. No results for input(s): "AMMONIA" in the last 8760 hours. CBC: Recent Labs    06/26/22 0834 07/05/22 1107  WBC 10.7 12.5*  NEUTROABS 5,083 8,563*  HGB 15.1 14.4  HCT 43.5 41.1  MCV 95.2 94.5  PLT 233 211   Lipid Panel: Recent Labs    06/26/22 0834  CHOL 179  HDL 79  LDLCALC 86  TRIG 56  CHOLHDL 2.3   TSH: No results for input(s): "TSH" in the last 8760 hours. A1C: Lab Results  Component Value Date   HGBA1C 5.3 12/01/2020     Assessment/Plan There are no diagnoses linked to this encounter.  No follow-ups on file.: ***  Janazia Schreier K. Biagio Borg [facility] [phone]

## 2023-01-03 ENCOUNTER — Encounter: Payer: Self-pay | Admitting: Podiatry

## 2023-01-03 ENCOUNTER — Ambulatory Visit: Payer: Medicare Other | Admitting: Podiatry

## 2023-01-03 ENCOUNTER — Ambulatory Visit (INDEPENDENT_AMBULATORY_CARE_PROVIDER_SITE_OTHER): Payer: Medicare Other

## 2023-01-03 DIAGNOSIS — M7662 Achilles tendinitis, left leg: Secondary | ICD-10-CM

## 2023-01-03 MED ORDER — MELOXICAM 15 MG PO TABS: 15.0000 mg | ORAL_TABLET | Freq: Every day | ORAL | 0 refills | Status: DC

## 2023-01-03 MED ORDER — METHYLPREDNISOLONE 4 MG PO TBPK: ORAL_TABLET | ORAL | 0 refills | Status: DC

## 2023-01-03 NOTE — Patient Instructions (Signed)

## 2023-01-03 NOTE — Progress Notes (Signed)
Subjective:  Patient ID: Connor [name], male    DOB: 04/16/1956,   MRN: [id number]  Chief Complaint  Patient presents with   Tendonitis    Achilles tendinitis of left lower extremity. Shooting pain to the back of heel. Injury June 2023. PCP diagnosed achilles tendinitis and patietnt has been icing, using Voltaren, and taking Aleve. Stretches and walking.     67 y.o. male presents for concern of left achilles tendon pain that has been going on since last summer. Relates he injured it after several walks up a steep hill. States he has been stretching, taking aleve, using voltaren and even used an achilles strap that did not help. PCP advised to follow-up  Denies any other pedal complaints. Denies n/v/f/c.   Past Medical History:  Diagnosis Date   Abdominal pain, left lower quadrant    Actinic keratosis    ADD (attention deficit disorder)    Alcohol abuse, unspecified    Anxiety    Anxiety state, unspecified    Arthritis    shoulders and hips   Atrial fibrillation (HCC)    Attention deficit disorder without mention of hyperactivity    Cervicalgia    COVID    Depressive disorder, not elsewhere classified    Eczema 11/25/2011   right hand   Edema    Encounter for long-term (current) use of other medications    Glaucoma    Right eye   Headache(784.0)    tension or sinus   High cholesterol    Inguinal hernia 11/25/2011   right   Inguinal hernia without mention of obstruction or gangrene, unilateral or unspecified, (not specified as recurrent)    Insomnia, unspecified    Internal hemorrhoids without mention of complication    Irritable bowel syndrome    Irritable bowel syndrome (IBS)    Lumbago    Major depressive disorder, recurrent episode, severe, without mention of psychotic behavior    Obesity, unspecified    Other and unspecified hyperlipidemia    Other atopic dermatitis and related conditions    Other disorders of vitreous    Other seborrheic keratosis    Other  specified visual disturbances    Pain in joint, pelvic region and thigh    Pain in joint, site unspecified    Pathologic fracture of vertebrae    Poisoning and toxic reactions caused by other specified animals and plants    Recent retinal detachment, partial, with single defect    Routine general medical examination at a health care facility    Sciatica    Sinus infection 11/25/2011   started antibiotic 12/18/2011 x 7 days; current cough   Special screening for malignant neoplasm of prostate    Tension headache    Type II or unspecified type diabetes mellitus without mention of complication, not stated as uncontrolled    Unspecified essential hypertension     Objective:  Physical Exam: Vascular: DP/PT pulses 2/4 bilateral. CFT <3 seconds. Normal hair growth on digits. No edema.  Skin. No lacerations or abrasions bilateral feet.  Musculoskeletal: MMT 5/5 bilateral lower extremities in DF, PF, Inversion and Eversion. Deceased ROM in DF of ankle joint.  Tender to achilles tendon watershed area in area of prominence. Minimal pain in insertion. No pain with DF and PF. There is a palpable mass but strength 5/5 in PF.  Neurological: Sensation intact to light touch.   Assessment:   1. Achilles tendinitis of left lower extremity      Plan:  Patient was evaluated  and treated and all questions answered. -Xrays reviewed No acute fractures or dislocations. There is a moderate haglunds deformity not spurrin noted.  -Discussed Achilles insertional tendonitis and treatment options with patient.  -Discussed stretching exercises. -Rx Meloxicam provided  -Rx medrol dose pack  -Heel lifts provided and discussed proper shoewear.  -Discussed if no improvement will consider MRI/PT/EPAT/PRP injections.  -Patient to return to office in 6 weeks    Lorenda Peck, DPM

## 2023-02-06 ENCOUNTER — Other Ambulatory Visit: Payer: Self-pay | Admitting: Nurse Practitioner

## 2023-02-06 DIAGNOSIS — F902 Attention-deficit hyperactivity disorder, combined type: Secondary | ICD-10-CM

## 2023-02-06 MED ORDER — AMPHETAMINE-DEXTROAMPHET ER 10 MG PO CP24: ORAL_CAPSULE | ORAL | 0 refills | Status: DC

## 2023-02-06 NOTE — Telephone Encounter (Signed)
Patient requested refill.  Epic LR: 12/29/2022 Contract Date: 01/01/2023  Pended Rx and sent to [facility] for approval.

## 2023-02-07 ENCOUNTER — Encounter: Payer: Self-pay | Admitting: Podiatry

## 2023-02-07 ENCOUNTER — Ambulatory Visit: Payer: Medicare Other | Admitting: Podiatry

## 2023-02-07 DIAGNOSIS — M7662 Achilles tendinitis, left leg: Secondary | ICD-10-CM

## 2023-02-07 MED ORDER — METHYLPREDNISOLONE 4 MG PO TBPK: ORAL_TABLET | ORAL | 0 refills | Status: DC

## 2023-02-07 MED ORDER — MELOXICAM 15 MG PO TABS: 15.0000 mg | ORAL_TABLET | Freq: Every day | ORAL | 0 refills | Status: DC

## 2023-02-07 NOTE — Progress Notes (Signed)
Subjective:  Patient ID: Connor [name], male    DOB: 1956-11-26,   MRN: [id number]  Chief Complaint  Patient presents with   Follow-up    Achilles Tendinitis of left foot     67 y.o. male presents for follow-up of left achilles tendon pain. Relates he was doing really well and the medicines had helped and so had the stretching. Does relates 3 days ago he was walking up heel on small foot holds and felt a tear in the back of the heel and has had some of the pain worsen. Has been wearing heel lifts  Denies any other pedal complaints. Denies n/v/f/c.   Past Medical History:  Diagnosis Date   Abdominal pain, left lower quadrant    Actinic keratosis    ADD (attention deficit disorder)    Alcohol abuse, unspecified    Anxiety    Anxiety state, unspecified    Arthritis    shoulders and hips   Atrial fibrillation (HCC)    Attention deficit disorder without mention of hyperactivity    Cervicalgia    COVID    Depressive disorder, not elsewhere classified    Eczema 11/25/2011   right hand   Edema    Encounter for long-term (current) use of other medications    Glaucoma    Right eye   Headache(784.0)    tension or sinus   High cholesterol    Inguinal hernia 11/25/2011   right   Inguinal hernia without mention of obstruction or gangrene, unilateral or unspecified, (not specified as recurrent)    Insomnia, unspecified    Internal hemorrhoids without mention of complication    Irritable bowel syndrome    Irritable bowel syndrome (IBS)    Lumbago    Major depressive disorder, recurrent episode, severe, without mention of psychotic behavior    Obesity, unspecified    Other and unspecified hyperlipidemia    Other atopic dermatitis and related conditions    Other disorders of vitreous    Other seborrheic keratosis    Other specified visual disturbances    Pain in joint, pelvic region and thigh    Pain in joint, site unspecified    Pathologic fracture of vertebrae    Poisoning and  toxic reactions caused by other specified animals and plants    Recent retinal detachment, partial, with single defect    Routine general medical examination at a health care facility    Sciatica    Sinus infection 11/25/2011   started antibiotic 12/18/2011 x 7 days; current cough   Special screening for malignant neoplasm of prostate    Tension headache    Type II or unspecified type diabetes mellitus without mention of complication, not stated as uncontrolled    Unspecified essential hypertension     Objective:  Physical Exam: Vascular: DP/PT pulses 2/4 bilateral. CFT <3 seconds. Normal hair growth on digits. No edema.  Skin. No lacerations or abrasions bilateral feet.  Musculoskeletal: MMT 5/5 bilateral lower extremities in DF, PF, Inversion and Eversion. Deceased ROM in DF of ankle joint.  Tender to achilles tendon watershed area in area of prominence. Minimal pain in insertion. No pain with DF and PF. There is a palpable mass but strength 5/5 in PF.  Neurological: Sensation intact to light touch.   Assessment:   1. Achilles tendinitis of left lower extremity       Plan:  Patient was evaluated and treated and all questions answered. -Xrays reviewed No acute fractures or dislocations. There is a  moderate haglunds deformity not spurrin noted.  -Discussed Achilles insertional tendonitis and treatment options with patient.  -Discussed stretching exercises. Continue  -Rx Meloxicam provided  -Rx medrol dose pack  -Continue heel lifts -Discussed if no improvement will consider MRI/PT/EPAT/PRP injections.  -Patient to return to office in 6 weeks    Lorenda Peck, DPM

## 2023-02-08 ENCOUNTER — Encounter: Payer: Self-pay | Admitting: Nurse Practitioner

## 2023-03-14 ENCOUNTER — Other Ambulatory Visit: Payer: Self-pay | Admitting: Nurse Practitioner

## 2023-03-14 DIAGNOSIS — F902 Attention-deficit hyperactivity disorder, combined type: Secondary | ICD-10-CM

## 2023-03-14 MED ORDER — AMPHETAMINE-DEXTROAMPHET ER 10 MG PO CP24: ORAL_CAPSULE | ORAL | 0 refills | Status: DC

## 2023-03-14 NOTE — Telephone Encounter (Signed)
Patient is requesting a refill of the following medications: Requested Prescriptions   Pending Prescriptions Disp Refills   amphetamine-dextroamphetamine (ADDERALL XR) 10 MG 24 hr capsule 30 capsule 0    Sig: Take one tablet once a day for ADD    Date of last refill:02/06/23  Refill amount: 30  Treatment agreement date: 01/01/23

## 2023-03-16 ENCOUNTER — Encounter: Payer: Self-pay | Admitting: Podiatry

## 2023-03-21 ENCOUNTER — Ambulatory Visit: Payer: Medicare Other | Admitting: Podiatry

## 2023-03-22 ENCOUNTER — Encounter: Payer: Self-pay | Admitting: Nurse Practitioner

## 2023-03-22 ENCOUNTER — Other Ambulatory Visit: Payer: Self-pay | Admitting: Nurse Practitioner

## 2023-03-22 ENCOUNTER — Telehealth: Payer: Self-pay

## 2023-03-22 ENCOUNTER — Ambulatory Visit: Payer: Medicare Other | Admitting: Nurse Practitioner

## 2023-03-22 ENCOUNTER — Ambulatory Visit (INDEPENDENT_AMBULATORY_CARE_PROVIDER_SITE_OTHER): Payer: Medicare Other | Admitting: Nurse Practitioner

## 2023-03-22 DIAGNOSIS — Z0000 Encounter for general adult medical examination without abnormal findings: Secondary | ICD-10-CM | POA: Diagnosis not present

## 2023-03-22 DIAGNOSIS — F419 Anxiety disorder, unspecified: Secondary | ICD-10-CM

## 2023-03-22 NOTE — Telephone Encounter (Signed)
Patient is requesting a refill of the following medications: Requested Prescriptions   Pending Prescriptions Disp Refills   clonazePAM (KLONOPIN) 0.5 MG tablet [Pharmacy Med Name: clonazepam 0.5 mg tablet] 30 tablet 2    Sig: TAKE ONE TABLET BY MOUTH EVERY DAY AS NEEDED FOR ANXIETY    Date of last refill: 11/13/2022  Refill amount: 30/2 refills   Treatment agreement date: 01/01/2023

## 2023-03-22 NOTE — Progress Notes (Signed)
   This service is provided via telemedicine  No vital signs collected/recorded due to the encounter was a telemedicine visit.   Location of patient (ex: home, work):  Home  Patient consents to a telephone visit: Yes, see telephone visit dated 03/22/23  Location of the provider (ex: office, home): [place], [place]   Name of any referring provider:  N/A  Names of all persons participating in the telemedicine service and their role in the encounter:  S.Chrae B/CMA, Sherrie Mustache, NP, and Patient   Time spent on call:  9 min with medical assistant

## 2023-03-22 NOTE — Progress Notes (Signed)
Subjective:   Connor [name] is a 67 y.o. male who presents for Medicare Annual/Subsequent preventive examination.  Review of Systems     Cardiac Risk Factors include: advanced age (>70men, >65 women);male gender;diabetes mellitus;dyslipidemia     Objective:    Today's Vitals   03/22/23 0912  PainSc: 2    There is no height or weight on file to calculate BMI.     03/22/2023    8:45 AM 01/01/2023    9:59 AM 07/05/2022   10:40 AM 04/12/2022   11:54 AM 01/18/2022   10:24 AM 11/28/2021    9:40 AM 06/08/2021    8:36 AM  Advanced Directives  Does Patient Have a Medical Advance Directive? Yes Yes Yes No;Yes Yes Yes Yes  Type of Paramedic of [place];Living will [place];Living will [place];Living will;Out of facility DNR (pink MOST or yellow form) [place];Living will [place];Living will [place];Living will [place];Living will;Out of facility DNR (pink MOST or yellow form)  Does patient want to make changes to medical advance directive? No - Patient declined No - Patient declined No - Patient declined No - Patient declined No - Patient declined No - Patient declined No - Patient declined  Copy of Reserve in Chart? No - copy requested No - copy requested No - copy requested Yes - validated most recent copy scanned in chart (See row information) Yes - validated most recent copy scanned in chart (See row information) Yes - validated most recent copy scanned in chart (See row information) No - copy requested  Pre-existing out of facility DNR order (yellow form or pink MOST form)       Yellow form placed in chart (order not valid for inpatient use)    Current Medications (verified) Outpatient Encounter Medications as of 03/22/2023  Medication Sig   amphetamine-dextroamphetamine (ADDERALL XR) 10 MG 24 hr capsule Take one tablet  once a day for ADD   cetirizine (ZYRTEC) 10 MG tablet Take 10 mg by mouth as needed.   clonazePAM (KLONOPIN) 0.5 MG tablet TAKE ONE TABLET BY MOUTH EVERY DAY AS NEEDED FOR ANXIETY   dicyclomine (BENTYL) 20 MG tablet TAKE 1 TABLET BY MOUTH EVERY 6 HOURS AS NEEDED FOR PAIN OR IRRITABLE BOWEL SYNDROME   famotidine-calcium carbonate-magnesium hydroxide (PEPCID COMPLETE) 10-800-165 MG chewable tablet Chew 1 tablet by mouth daily as needed.   halobetasol (ULTRAVATE) 0.05 % cream APPLY EXTERNALLY TO RASH UP TO THREE TIMES DAILY   latanoprost (XALATAN) 0.005 % ophthalmic solution Place 1 drop into the right eye at bedtime.   losartan (COZAAR) 50 MG tablet Take 1 tablet (50 mg total) by mouth daily.   lovastatin (MEVACOR) 20 MG tablet TAKE 1 TABLET(20 MG) BY MOUTH AT BEDTIME   Multiple Vitamin (MULTIVITAMIN) tablet Take 1 tablet by mouth daily.   [DISCONTINUED] meloxicam (MOBIC) 15 MG tablet Take 1 tablet (15 mg total) by mouth daily.   [DISCONTINUED] methylPREDNISolone (MEDROL DOSEPAK) 4 MG TBPK tablet Take as directed   No facility-administered encounter medications on file as of 03/22/2023.    Allergies (verified) Amitriptyline, Zolpidem tartrate, Amoxicillin-pot clavulanate, and Propoxyphene hcl   History: Past Medical History:  Diagnosis Date   Abdominal pain, left lower quadrant    Actinic keratosis    ADD (attention deficit disorder)    Alcohol abuse, unspecified    Anxiety    Anxiety state, unspecified  Arthritis    shoulders and hips   Atrial fibrillation (HCC)    Attention deficit disorder without mention of hyperactivity    Cervicalgia    COVID    Depressive disorder, not elsewhere classified    Eczema 11/25/2011   right hand   Edema    Encounter for long-term (current) use of other medications    Glaucoma    Right eye   Headache(784.0)    tension or sinus   High cholesterol    Inguinal hernia 11/25/2011   right   Inguinal hernia without mention of obstruction or  gangrene, unilateral or unspecified, (not specified as recurrent)    Insomnia, unspecified    Internal hemorrhoids without mention of complication    Irritable bowel syndrome    Irritable bowel syndrome (IBS)    Lumbago    Major depressive disorder, recurrent episode, severe, without mention of psychotic behavior    Obesity, unspecified    Other and unspecified hyperlipidemia    Other atopic dermatitis and related conditions    Other disorders of vitreous    Other seborrheic keratosis    Other specified visual disturbances    Pain in joint, pelvic region and thigh    Pain in joint, site unspecified    Pathologic fracture of vertebrae    Poisoning and toxic reactions caused by other specified animals and plants    Recent retinal detachment, partial, with single defect    Routine general medical examination at a health care facility    Sciatica    Sinus infection 11/25/2011   started antibiotic 12/18/2011 x 7 days; current cough   Special screening for malignant neoplasm of prostate    Tension headache    Type II or unspecified type diabetes mellitus without mention of complication, not stated as uncontrolled    Unspecified essential hypertension    Past Surgical History:  Procedure Laterality Date   [place]   CATARACT EXTRACTION  2011   right eye   CATARACT EXTRACTION W/ INTRAOCULAR LENS IMPLANT Left 04/04/2017   Dr. Richardean Sale   EYE SURGERY Right 09/13/2018   Dr.Hanes with [place]  04/15/14   thrombosed int. Hemorrhoid. Dr. Georganna Skeans   HERNIA REPAIR  05/03/2011   left   INGUINAL HERNIA REPAIR  12/28/2011   Procedure: HERNIA REPAIR INGUINAL ADULT;  Surgeon: Rolm Bookbinder, MD;  Location: [facility];  Service: General;  Laterality: Right;   NASAL POLYP SURGERY  01/05/2011   DR. [facility]    NASAL SINUS SURGERY  12/2010   removed anal warts  1976   Dr Allyson Sabal    RETINAL DETACHMENT REPAIR W/ SCLERAL BUCKLE LE  07/22/2008    right eye; pars plana vitrectomy   TONSILLECTOMY AND ADENOIDECTOMY  1964   TYMPANOSTOMY TUBE PLACEMENT  June 2015   Dr. Loletha Grayer. [place]   Family History  Problem Relation Age of Onset   Stroke Father    Cancer Brother        prostate   Cancer Paternal Grandfather        colon   Social History   Socioeconomic History   Marital status: Married    Spouse name: Not on file   Number of children: Not on file   Years of education: Not on file   Highest education level: Not on file  Occupational History   Not on file  Tobacco Use   Smoking status: Former    Types: Cigarettes  Quit date: 05/29/1997    Years since quitting: 25.8   Smokeless tobacco: Never  Vaping Use   Vaping Use: Never used  Substance and Sexual Activity   Alcohol use: Yes    Alcohol/week: 0.0 standard drinks of alcohol    Comment: 4 or more beers/day   Drug use: No   Sexual activity: Yes    Partners: Female    Comment: Wife  Other Topics Concern   Not on file  Social History Narrative   Not on file   Social Determinants of Health   Financial Resource Strain: Not on file  Food Insecurity: Not on file  Transportation Needs: Not on file  Physical Activity: Not on file  Stress: Not on file  Social Connections: Not on file    Tobacco Counseling Counseling given: Not Answered   Clinical Intake:  Pre-visit preparation completed: Yes  Pain : 0-10 Pain Score: 2  Pain Type: Chronic pain Pain Location: Hip Pain Descriptors / Indicators: Aching     BMI - recorded: 29 Nutritional Status: BMI 25 -29 Overweight Diabetes: Yes  How often do you need to have someone help you when you read instructions, pamphlets, or other written materials from your doctor or pharmacy?: 1 - Never  Diabetic?no         Activities of Daily Living    03/22/2023    9:09 AM  In your present state of health, do you have any difficulty performing the following activities:  Hearing? 0  Vision? 0  Difficulty  concentrating or making decisions? 0  Walking or climbing stairs? 0  Dressing or bathing? 0  Doing errands, shopping? 0  Preparing Food and eating ? N  Using the Toilet? N  In the past six months, have you accidently leaked urine? N  Do you have problems with loss of bowel control? N  Managing your Medications? N  Managing your Finances? N  Housekeeping or managing your Housekeeping? N    Patient Care Team: Lauree Chandler, NP as PCP - General (Geriatric Medicine) Rozetta Nunnery, MD (Inactive) as Consulting Physician (Otolaryngology) Georganna Skeans, MD as Consulting Physician (General Surgery) Ladene Artist, MD as Consulting Physician (Gastroenterology) Rolm Bookbinder, MD as Consulting Physician (General Surgery) Allyn Kenner, MD (Dermatology) Nehemiah Settle, MD (Gastroenterology)  Indicate any recent Medical Services you may have received from other than [facility] providers in the past year (date may be approximate).     Assessment:   This is a routine wellness examination for [place].  Hearing/Vision screen Hearing Screening - Comments:: Patient admits to hearing issues, however refused audiology referral at this time. Right ear is worse  Vision Screening - Comments:: Last eye exam less than 12 months ago with Dr.Dunnington   Dietary issues and exercise activities discussed: Current Exercise Habits: The patient does not participate in regular exercise at present   Goals Addressed   None    Depression Screen    03/22/2023    8:41 AM 01/01/2023    9:58 AM 12/06/2020    9:42 AM 12/05/2019    9:34 AM 11/29/2018    8:23 AM 11/29/2017    8:35 AM 05/12/2015   12:12 PM  PHQ 2/9 Scores  PHQ - 2 Score 0 0 0 0 0 0 0    Fall Risk    03/22/2023    8:41 AM 01/01/2023    9:58 AM 07/05/2022   10:40 AM 06/30/2022   10:12 AM 03/21/2022    1:28 PM  Fall Risk  Falls in the past year? 0 0 0 0 0  Number falls in past yr: 0 0 0 0 0  Injury with Fall? 0 0 0 0 0  Risk for fall  due to : No Fall Risks No Fall Risks No Fall Risks No Fall Risks No Fall Risks  Follow up Falls evaluation completed  Falls evaluation completed Falls evaluation completed Falls evaluation completed    [place]:  Any stairs in or around the home? Yes  If so, are there any without handrails? No  Home free of loose throw rugs in walkways, pet beds, electrical cords, etc? Yes  Adequate lighting in your home to reduce risk of falls? Yes   ASSISTIVE DEVICES UTILIZED TO PREVENT FALLS:  Life alert? No  Use of a cane, walker or w/c? No  Grab bars in the bathroom? No  Shower chair or bench in shower? Yes  Elevated toilet seat or a handicapped toilet? Yes   TIMED UP AND GO:  Was the test performed? No .    Cognitive Function:        03/22/2023    8:45 AM  6CIT Screen  What Year? 0 points  What month? 0 points  What time? 0 points  Count back from 20 0 points  Months in reverse 0 points  Repeat phrase 2 points  Total Score 2 points    Immunizations Immunization History  Administered Date(s) Administered   DTaP 05/31/2006   Fluad Quad(high Dose 65+) 09/20/2022   Influenza,inj,Quad PF,6+ Mos 09/09/2019, 09/21/2020, 10/28/2021   Influenza-Unspecified 10/06/2014, 09/28/2015, 09/25/2016, 09/25/2017, 09/25/2018   PFIZER(Purple Top)SARS-COV-2 Vaccination 03/18/2020, 04/12/2020, 10/21/2020   Pfizer Covid-19 Vaccine Bivalent Booster 42yrs & up 09/13/2021   Tdap 10/25/2013   Zoster Recombinat (Shingrix) 10/20/2019, 12/29/2019    TDAP status: Up to date  Flu Vaccine status: Up to date  Pneumococcal vaccine status: Due, Education has been provided regarding the importance of this vaccine. Advised may receive this vaccine at local pharmacy or Health Dept. Aware to provide a copy of the vaccination record if obtained from local pharmacy or Health Dept. Verbalized acceptance and understanding.  Covid-19 vaccine status: Information provided on how to  obtain vaccines.   Qualifies for Shingles Vaccine? Yes   Zostavax completed No   Shingrix Completed?: Yes  Screening Tests Health Maintenance  Topic Date Due   Pneumonia Vaccine 64+ Years old (1 of 2 - PCV) Never done   HIV Screening  Never done   COVID-19 Vaccine (5 - 2023-24 season) 08/25/2022   DTaP/Tdap/Td (3 - Td or Tdap) 10/26/2023   Medicare Annual Wellness (AWV)  03/21/2024   COLONOSCOPY (Pts 45-67yrs Insurance coverage will need to be confirmed)  02/07/2028   INFLUENZA VACCINE  Completed   Hepatitis C Screening  Completed   Zoster Vaccines- Shingrix  Completed   HPV VACCINES  Aged Out    Health Maintenance  Health Maintenance Due  Topic Date Due   Pneumonia Vaccine 62+ Years old (1 of 2 - PCV) Never done   HIV Screening  Never done   COVID-19 Vaccine (5 - 2023-24 season) 08/25/2022    Colorectal cancer screening: Type of screening: Colonoscopy. Completed 2019. Repeat every 10 years  Lung Cancer Screening: (Low Dose CT Chest recommended if Age 79-80 years, 30 pack-year currently smoking OR have quit w/in 15years.) does not qualify.   Lung Cancer Screening Referral: na  Additional Screening:  Hepatitis C Screening: does qualify; Completed 2017  Vision  Screening: Recommended annual ophthalmology exams for early detection of glaucoma and other disorders of the eye. Is the patient up to date with their annual eye exam?  Yes  Who is the provider or what is the name of the office in which the patient attends annual eye exams? [facility] If pt is not established with a provider, would they like to be referred to a provider to establish care? No .   Dental Screening: Recommended annual dental exams for proper oral hygiene  Community Resource Referral / Chronic Care Management: CRR required this visit?  No   CCM required this visit?  No      Plan:     I have personally reviewed and noted the following in the patient's chart:   Medical and social  history Use of alcohol, tobacco or illicit drugs  Current medications and supplements including opioid prescriptions. Patient is not currently taking opioid prescriptions. Functional ability and status Nutritional status Physical activity Advanced directives List of other physicians Hospitalizations, surgeries, and ER visits in previous 12 months Vitals Screenings to include cognitive, depression, and falls Referrals and appointments  In addition, I have reviewed and discussed with patient certain preventive protocols, quality metrics, and best practice recommendations. A written personalized care plan for preventive services as well as general preventive health recommendations were provided to patient.     Lauree Chandler, NP   03/22/2023   Virtual Visit via Video Note  I connected with Toniann Fail on 03/22/23 at  9:00 AM EDT by a video enabled telemedicine application and verified that I am speaking with the correct person using two identifiers.  Location: Patient: home Provider: [facility]   I discussed the limitations of evaluation and management by telemedicine and the availability of in person appointments. The patient expressed understanding and agreed to proceed.    I discussed the assessment and treatment plan with the patient. The patient was provided an opportunity to ask questions and all were answered. The patient agreed with the plan and demonstrated an understanding of the instructions.   The patient was advised to call back or seek an in-person evaluation if the symptoms worsen or if the condition fails to improve as anticipated.  I provided 15 minutes of non-face-to-face time during this encounter.  Carlos American. Dewaine Oats, AGNP Avs printed and mailed.

## 2023-03-22 NOTE — Telephone Encounter (Signed)
[name], stasiak are scheduled for a virtual visit with your provider today.    Just as we do with appointments in the office, we must obtain your consent to participate.  Your consent will be active for this visit and any virtual visit you may have with one of our providers in the next 365 days.    If you have a MyChart account, I can also send a copy of this consent to you electronically.  All virtual visits are billed to your insurance company just like a traditional visit in the office.  As this is a virtual visit, video technology does not allow for your provider to perform a traditional examination.  This may limit your provider's ability to fully assess your condition.  If your provider identifies any concerns that need to be evaluated in person or the need to arrange testing such as labs, EKG, etc, we will make arrangements to do so.    Although advances in technology are sophisticated, we cannot ensure that it will always work on either your end or our end.  If the connection with a video visit is poor, we may have to switch to a telephone visit.  With either a video or telephone visit, we are not always able to ensure that we have a secure connection.   I need to obtain your verbal consent now.   Are you willing to proceed with your visit today?   Connor [name] has provided verbal consent on 03/22/2023 for a virtual visit (video or telephone).   Leigh Aurora [place], [place] 03/22/2023  8:38 AM

## 2023-03-22 NOTE — Patient Instructions (Signed)
Mr. Connor [name] , Thank you for taking time to come for your Medicare Wellness Visit. I appreciate your ongoing commitment to your health goals. Please review the following plan we discussed and let me know if I can assist you in the future.   Screening recommendations/referrals: Colonoscopy up to date Recommended yearly ophthalmology/optometry visit for glaucoma screening and checkup Recommended yearly dental visit for hygiene and checkup  Vaccinations: Influenza vaccine due annually in September/October Pneumococcal vaccine- will get at next office visit Tdap vaccine up to date Shingles vaccine up to date    Advanced directives: on file  Conditions/risks identified: advanced directives   Next appointment: yearly   Preventive Care 70 Years and Older, Male Preventive care refers to lifestyle choices and visits with your health care provider that can promote health and wellness. What does preventive care include? A yearly physical exam. This is also called an annual well check. Dental exams once or twice a year. Routine eye exams. Ask your health care provider how often you should have your eyes checked. Personal lifestyle choices, including: Daily care of your teeth and gums. Regular physical activity. Eating a healthy diet. Avoiding tobacco and drug use. Limiting alcohol use. Practicing safe sex. Taking low doses of aspirin every day. Taking vitamin and mineral supplements as recommended by your health care provider. What happens during an annual well check? The services and screenings done by your health care provider during your annual well check will depend on your age, overall health, lifestyle risk factors, and family history of disease. Counseling  Your health care provider may ask you questions about your: Alcohol use. Tobacco use. Drug use. Emotional well-being. Home and relationship well-being. Sexual activity. Eating habits. History of falls. Memory and ability to  understand (cognition). Work and work Statistician. Screening  You may have the following tests or measurements: Height, weight, and BMI. Blood pressure. Lipid and cholesterol levels. These may be checked every 5 years, or more frequently if you are over 65 years old. Skin check. Lung cancer screening. You may have this screening every year starting at age 17 if you have a 30-pack-year history of smoking and currently smoke or have quit within the past 15 years. Fecal occult blood test (FOBT) of the stool. You may have this test every year starting at age 66. Flexible sigmoidoscopy or colonoscopy. You may have a sigmoidoscopy every 5 years or a colonoscopy every 10 years starting at age 58. Prostate cancer screening. Recommendations will vary depending on your family history and other risks. Hepatitis C blood test. Hepatitis B blood test. Sexually transmitted disease (STD) testing. Diabetes screening. This is done by checking your blood sugar (glucose) after you have not eaten for a while (fasting). You may have this done every 1-3 years. Abdominal aortic aneurysm (AAA) screening. You may need this if you are a current or former smoker. Osteoporosis. You may be screened starting at age 37 if you are at high risk. Talk with your health care provider about your test results, treatment options, and if necessary, the need for more tests. Vaccines  Your health care provider may recommend certain vaccines, such as: Influenza vaccine. This is recommended every year. Tetanus, diphtheria, and acellular pertussis (Tdap, Td) vaccine. You may need a Td booster every 10 years. Zoster vaccine. You may need this after age 2. Pneumococcal 13-valent conjugate (PCV13) vaccine. One dose is recommended after age 48. Pneumococcal polysaccharide (PPSV23) vaccine. One dose is recommended after age 98. Talk to your health care provider  about which screenings and vaccines you need and how often you need them. This  information is not intended to replace advice given to you by your health care provider. Make sure you discuss any questions you have with your health care provider. Document Released: 01/07/2016 Document Revised: 08/30/2016 Document Reviewed: 10/12/2015 Elsevier Interactive Patient Education  2017 [employer] Prevention in the Home Falls can cause injuries. They can happen to people of all ages. There are many things you can do to make your home safe and to help prevent falls. What can I do on the outside of my home? Regularly fix the edges of walkways and driveways and fix any cracks. Remove anything that might make you trip as you walk through a door, such as a raised step or threshold. Trim any bushes or trees on the path to your home. Use bright outdoor lighting. Clear any walking paths of anything that might make someone trip, such as rocks or tools. Regularly check to see if handrails are loose or broken. Make sure that both sides of any steps have handrails. Any raised decks and porches should have guardrails on the edges. Have any leaves, snow, or ice cleared regularly. Use sand or salt on walking paths during winter. Clean up any spills in your garage right away. This includes oil or grease spills. What can I do in the bathroom? Use night lights. Install grab bars by the toilet and in the tub and shower. Do not use towel bars as grab bars. Use non-skid mats or decals in the tub or shower. If you need to sit down in the shower, use a plastic, non-slip stool. Keep the floor dry. Clean up any water that spills on the floor as soon as it happens. Remove soap buildup in the tub or shower regularly. Attach bath mats securely with double-sided non-slip rug tape. Do not have throw rugs and other things on the floor that can make you trip. What can I do in the bedroom? Use night lights. Make sure that you have a light by your bed that is easy to reach. Do not use any sheets or  blankets that are too big for your bed. They should not hang down onto the floor. Have a firm chair that has side arms. You can use this for support while you get dressed. Do not have throw rugs and other things on the floor that can make you trip. What can I do in the kitchen? Clean up any spills right away. Avoid walking on wet floors. Keep items that you use a lot in easy-to-reach places. If you need to reach something above you, use a strong step stool that has a grab bar. Keep electrical cords out of the way. Do not use floor polish or wax that makes floors slippery. If you must use wax, use non-skid floor wax. Do not have throw rugs and other things on the floor that can make you trip. What can I do with my stairs? Do not leave any items on the stairs. Make sure that there are handrails on both sides of the stairs and use them. Fix handrails that are broken or loose. Make sure that handrails are as long as the stairways. Check any carpeting to make sure that it is firmly attached to the stairs. Fix any carpet that is loose or worn. Avoid having throw rugs at the top or bottom of the stairs. If you do have throw rugs, attach them to the floor  with carpet tape. Make sure that you have a light switch at the top of the stairs and the bottom of the stairs. If you do not have them, ask someone to add them for you. What else can I do to help prevent falls? Wear shoes that: Do not have high heels. Have rubber bottoms. Are comfortable and fit you well. Are closed at the toe. Do not wear sandals. If you use a stepladder: Make sure that it is fully opened. Do not climb a closed stepladder. Make sure that both sides of the stepladder are locked into place. Ask someone to hold it for you, if possible. Clearly mark and make sure that you can see: Any grab bars or handrails. First and last steps. Where the edge of each step is. Use tools that help you move around (mobility aids) if they are  needed. These include: Canes. Walkers. Scooters. Crutches. Turn on the lights when you go into a dark area. Replace any light bulbs as soon as they burn out. Set up your furniture so you have a clear path. Avoid moving your furniture around. If any of your floors are uneven, fix them. If there are any pets around you, be aware of where they are. Review your medicines with your doctor. Some medicines can make you feel dizzy. This can increase your chance of falling. Ask your doctor what other things that you can do to help prevent falls. This information is not intended to replace advice given to you by your health care provider. Make sure you discuss any questions you have with your health care provider. Document Released: 10/07/2009 Document Revised: 05/18/2016 Document Reviewed: 01/15/2015 Elsevier Interactive Patient Education  2017 [employer].

## 2023-04-15 ENCOUNTER — Other Ambulatory Visit: Payer: Self-pay | Admitting: Nurse Practitioner

## 2023-04-15 DIAGNOSIS — E785 Hyperlipidemia, unspecified: Secondary | ICD-10-CM

## 2023-04-15 DIAGNOSIS — I10 Essential (primary) hypertension: Secondary | ICD-10-CM

## 2023-04-16 ENCOUNTER — Other Ambulatory Visit: Payer: Self-pay | Admitting: Nurse Practitioner

## 2023-04-16 DIAGNOSIS — F902 Attention-deficit hyperactivity disorder, combined type: Secondary | ICD-10-CM

## 2023-04-16 MED ORDER — AMPHETAMINE-DEXTROAMPHET ER 10 MG PO CP24: ORAL_CAPSULE | ORAL | 0 refills | Status: DC

## 2023-04-16 NOTE — Telephone Encounter (Signed)
Patient requested refill.  Epic LR: 03/14/2023 Contract Date: 01/01/2023  Pended Rx and sent to [facility] for approval.

## 2023-05-17 ENCOUNTER — Ambulatory Visit (INDEPENDENT_AMBULATORY_CARE_PROVIDER_SITE_OTHER): Payer: Medicare Other | Admitting: Family

## 2023-05-17 VITALS — BP 152/84 | HR 77 | Temp 97.7°F | Ht 70.5 in | Wt 203.2 lb

## 2023-05-17 DIAGNOSIS — R1032 Left lower quadrant pain: Secondary | ICD-10-CM | POA: Diagnosis not present

## 2023-05-17 LAB — CBC WITH DIFFERENTIAL/PLATELET
Absolute Monocytes: 951 cells/uL — ABNORMAL HIGH (ref 200–950)
Basophils Absolute: 29 cells/uL (ref 0–200)
Basophils Relative: 0.3 %
Eosinophils Absolute: 314 cells/uL (ref 15–500)
Eosinophils Relative: 3.2 %
HCT: 41.5 % (ref 38.5–50.0)
Hemoglobin: 13.9 g/dL (ref 13.2–17.1)
Lymphs Abs: 2724 cells/uL (ref 850–3900)
MCH: 32 pg (ref 27.0–33.0)
MCHC: 33.5 g/dL (ref 32.0–36.0)
MCV: 95.4 fL (ref 80.0–100.0)
MPV: 10.3 fL (ref 7.5–12.5)
Monocytes Relative: 9.7 %
Neutro Abs: 5782 cells/uL (ref 1500–7800)
Neutrophils Relative %: 59 %
Platelets: 196 10*3/uL (ref 140–400)
RBC: 4.35 10*6/uL (ref 4.20–5.80)
RDW: 11.8 % (ref 11.0–15.0)
Total Lymphocyte: 27.8 %
WBC: 9.8 10*3/uL (ref 3.8–10.8)

## 2023-05-17 MED ORDER — METRONIDAZOLE 500 MG PO TABS: 500.0000 mg | ORAL_TABLET | Freq: Three times a day (TID) | ORAL | 0 refills | Status: AC

## 2023-05-17 MED ORDER — CIPROFLOXACIN HCL 500 MG PO TABS: 500.0000 mg | ORAL_TABLET | Freq: Two times a day (BID) | ORAL | 0 refills | Status: AC

## 2023-05-17 NOTE — Progress Notes (Signed)
Provider: Amadou Katzenstein FNP-C  Sharon Seller, NP  Patient Care Team: Sharon Seller, NP as PCP - General (Geriatric Medicine) Drema Halon, MD (Inactive) as Consulting Physician (Otolaryngology) Violeta Gelinas, MD as Consulting Physician (General Surgery) Meryl Dare, MD as Consulting Physician (Gastroenterology) Emelia Loron, MD as Consulting Physician (General Surgery) Nita Sells, MD (Dermatology) Webb Silversmith, MD (Gastroenterology)  Extended Emergency Contact Information Primary Emergency Contact: Resnik,Lynn Address: [address]          [place], [place] [address] [place] of [place] Home Phone: [phone] Mobile Phone: [phone] Relation: Spouse  Code Status:  Full Code  Goals of care: Advanced Directive information    05/17/2023    1:00 PM  Advanced Directives  Does Patient Have a Medical Advance Directive? Yes  Type of Estate agent of [place];Living will  Does patient want to make changes to medical advance directive? No - Patient declined  Copy of Healthcare Power of Attorney in Chart? No - copy requested     Chief Complaint  Patient presents with   Acute Visit    Patient complains of abdominal tenderness, pain, and slight fever. Questions Diverticulitis.     HPI:  Pt is a 67 y.o. male seen today for an acute visit for evaluation of abdominal tenderness, pain, and slight fever on Tuesday morning. Thinks has Diverticulitis. Had diarrhea on Sunday.Has significant history of IBS- D  Appetite described as good. He denies any fever,chills,nausea,vomiting, constipation,dark or blood in the stool.    Past Medical History:  Diagnosis Date   Abdominal pain, left lower quadrant    Actinic keratosis    ADD (attention deficit disorder)    Alcohol abuse, unspecified    Anxiety    Anxiety state, unspecified    Arthritis    shoulders and hips   Atrial fibrillation (HCC)    Attention deficit  disorder without mention of hyperactivity    Cervicalgia    COVID    Depressive disorder, not elsewhere classified    Eczema 11/25/2011   right hand   Edema    Encounter for long-term (current) use of other medications    Glaucoma    Right eye   Headache(784.0)    tension or sinus   High cholesterol    Inguinal hernia 11/25/2011   right   Inguinal hernia without mention of obstruction or gangrene, unilateral or unspecified, (not specified as recurrent)    Insomnia, unspecified    Internal hemorrhoids without mention of complication    Irritable bowel syndrome    Irritable bowel syndrome (IBS)    Lumbago    Major depressive disorder, recurrent episode, severe, without mention of psychotic behavior    Obesity, unspecified    Other and unspecified hyperlipidemia    Other atopic dermatitis and related conditions    Other disorders of vitreous    Other seborrheic keratosis    Other specified visual disturbances    Pain in joint, pelvic region and thigh    Pain in joint, site unspecified    Pathologic fracture of vertebrae    Poisoning and toxic reactions caused by other specified animals and plants    Recent retinal detachment, partial, with single defect    Routine general medical examination at a health care facility    Sciatica    Sinus infection 11/25/2011   started antibiotic 12/18/2011 x 7 days; current cough   Special screening for malignant neoplasm of prostate    Tension headache  Type II or unspecified type diabetes mellitus without mention of complication, not stated as uncontrolled    Unspecified essential hypertension    Past Surgical History:  Procedure Laterality Date   APPENDECTOMY  1987   CATARACT EXTRACTION  2011   right eye   CATARACT EXTRACTION W/ INTRAOCULAR LENS IMPLANT Left 04/04/2017   Dr. Dawna Part   EYE SURGERY Right 09/13/2018   Dr.Hanes with [facility]    HEMORRHOID SURGERY  04/15/14   thrombosed int. Hemorrhoid. Dr. Violeta Gelinas    HERNIA REPAIR  05/03/2011   left   INGUINAL HERNIA REPAIR  12/28/2011   Procedure: HERNIA REPAIR INGUINAL ADULT;  Surgeon: Emelia Loron, MD;  Location:  SURGERY CENTER;  Service: General;  Laterality: Right;   NASAL POLYP SURGERY  01/05/2011   DR. [facility]    NASAL SINUS SURGERY  12/2010   removed anal warts  1976   Dr Terri Piedra    RETINAL DETACHMENT REPAIR W/ SCLERAL BUCKLE LE  07/22/2008   right eye; pars plana vitrectomy   TONSILLECTOMY AND ADENOIDECTOMY  1964   TYMPANOSTOMY TUBE PLACEMENT  June 2015   Dr. Salena Saner. [place]    Allergies  Allergen Reactions   Amitriptyline    Zolpidem Tartrate Other (See Comments)    Severe headache   Amoxicillin-Pot Clavulanate Rash   Propoxyphene Hcl Nausea Only    Outpatient Encounter Medications as of 05/17/2023  Medication Sig   amphetamine-dextroamphetamine (ADDERALL XR) 10 MG 24 hr capsule Take one tablet once a day for ADD   cetirizine (ZYRTEC) 10 MG tablet Take 10 mg by mouth as needed.   clonazePAM (KLONOPIN) 0.5 MG tablet TAKE ONE TABLET BY MOUTH EVERY DAY AS NEEDED FOR ANXIETY   dicyclomine (BENTYL) 20 MG tablet TAKE 1 TABLET BY MOUTH EVERY 6 HOURS AS NEEDED FOR PAIN OR IRRITABLE BOWEL SYNDROME   famotidine-calcium carbonate-magnesium hydroxide (PEPCID COMPLETE) 10-800-165 MG chewable tablet Chew 1 tablet by mouth daily as needed.   halobetasol (ULTRAVATE) 0.05 % cream APPLY EXTERNALLY TO RASH UP TO THREE TIMES DAILY   latanoprost (XALATAN) 0.005 % ophthalmic solution Place 1 drop into the right eye at bedtime.   losartan (COZAAR) 50 MG tablet TAKE ONE TABLET BY MOUTH ONCE DAILY   lovastatin (MEVACOR) 20 MG tablet TAKE 1 TABLET BY MOUTH NIGHTLY AT BEDTIME   Multiple Vitamin (MULTIVITAMIN) tablet Take 1 tablet by mouth daily.   No facility-administered encounter medications on file as of 05/17/2023.    Review of Systems  Constitutional:  Negative for appetite change, chills, fatigue, fever and unexpected weight change.  HENT:   Negative for congestion, dental problem, ear discharge, ear pain, facial swelling, hearing loss, nosebleeds, postnasal drip, rhinorrhea, sinus pressure, sinus pain, sneezing, sore throat, tinnitus and trouble swallowing.   Respiratory:  Negative for cough, chest tightness, shortness of breath and wheezing.   Cardiovascular:  Negative for chest pain, palpitations and leg swelling.  Gastrointestinal:  Positive for abdominal pain. Negative for abdominal distention, blood in stool, constipation, nausea and vomiting.       LLQ Hx of IBS-D  Genitourinary:  Negative for difficulty urinating, dysuria, flank pain, frequency and urgency.  Skin:  Negative for color change, pallor and rash.    Immunization History  Administered Date(s) Administered   DTaP 05/31/2006   Fluad Quad(high Dose 65+) 09/20/2022   Influenza,inj,Quad PF,6+ Mos 09/09/2019, 09/21/2020, 10/28/2021   Influenza-Unspecified 10/06/2014, 09/28/2015, 09/25/2016, 09/25/2017, 09/25/2018   PFIZER(Purple Top)SARS-COV-2 Vaccination 03/18/2020, 04/12/2020, 10/21/2020   Pfizer Covid-19 Theatre manager  87yrs & up 09/13/2021   Tdap 10/25/2013   Zoster Recombinat (Shingrix) 10/20/2019, 12/29/2019   Pertinent  Health Maintenance Due  Topic Date Due   INFLUENZA VACCINE  07/26/2023   COLONOSCOPY (Pts 45-28yrs Insurance coverage will need to be confirmed)  02/07/2028      03/21/2022    1:28 PM 06/30/2022   10:12 AM 07/05/2022   10:40 AM 01/01/2023    9:58 AM 03/22/2023    8:41 AM  Fall Risk  Falls in the past year? 0 0 0 0 0  Was there an injury with Fall? 0 0 0 0 0  Fall Risk Category Calculator 0 0 0 0 0  Fall Risk Category (Retired) Low Low Low Low   (RETIRED) Patient Fall Risk Level Low fall risk Low fall risk Low fall risk Low fall risk   Patient at Risk for Falls Due to No Fall Risks No Fall Risks No Fall Risks No Fall Risks No Fall Risks  Fall risk Follow up Falls evaluation completed Falls evaluation completed Falls evaluation  completed  Falls evaluation completed   Functional Status Survey:    Vitals:   05/17/23 1258 05/17/23 1301  BP: (!) 156/84 (!) 152/84  Pulse: 77   Temp: 97.7 F (36.5 C)   SpO2: 98%   Weight: 203 lb 3.2 oz (92.2 kg)   Height: 5' 10.5" (1.791 m)    Body mass index is 28.74 kg/m. Physical Exam Vitals reviewed.  Constitutional:      General: He is not in acute distress.    Appearance: Normal appearance. He is overweight. He is not ill-appearing or diaphoretic.  HENT:     Head: Normocephalic.  Cardiovascular:     Rate and Rhythm: Normal rate and regular rhythm.     Pulses: Normal pulses.     Heart sounds: Normal heart sounds. No murmur heard.    No friction rub. No gallop.  Pulmonary:     Effort: Pulmonary effort is normal. No respiratory distress.     Breath sounds: Normal breath sounds. No wheezing, rhonchi or rales.  Chest:     Chest wall: No tenderness.  Abdominal:     General: Bowel sounds are normal. There is no distension.     Palpations: Abdomen is soft. There is no mass.     Tenderness: There is abdominal tenderness in the left upper quadrant and left lower quadrant. There is no right CVA tenderness, left CVA tenderness, guarding or rebound. Negative signs include Murphy's sign.  Musculoskeletal:        General: No swelling or tenderness. Normal range of motion.     Right lower leg: No edema.     Left lower leg: No edema.  Skin:    General: Skin is warm and dry.     Coloration: Skin is not pale.     Findings: No erythema.  Neurological:     Mental Status: He is alert and oriented to person, place, and time.     Motor: No weakness.     Gait: Gait normal.  Psychiatric:        Mood and Affect: Mood normal.        Speech: Speech normal.        Behavior: Behavior normal.    Labs reviewed: Recent Labs    06/26/22 0834 12/28/22 0824  NA 140 138  K 4.7 4.4  CL 103 100  CO2 28 28  GLUCOSE 111* 110*  BUN 14 19  CREATININE 1.13 0.96  CALCIUM 9.8 9.8    Recent Labs    06/26/22 0834 12/28/22 0824  AST 26 36*  ALT 33 45  BILITOT 0.5 0.8  PROT 6.5 7.0   Recent Labs    06/26/22 0834 07/05/22 1107  WBC 10.7 12.5*  NEUTROABS 5,083 8,563*  HGB 15.1 14.4  HCT 43.5 41.1  MCV 95.2 94.5  PLT 233 211   Lab Results  Component Value Date   TSH 1.41 06/08/2021   Lab Results  Component Value Date   HGBA1C 5.3 12/01/2020   Lab Results  Component Value Date   CHOL 179 06/26/2022   HDL 79 06/26/2022   LDLCALC 86 06/26/2022   TRIG 56 06/26/2022   CHOLHDL 2.3 06/26/2022    Significant Diagnostic Results in last 30 days:  No results found.  Assessment/Plan  Abdominal pain, LLQ Afebrile  - CBC with Differential/Platelet - ciprofloxacin (CIPRO) 500 MG tablet; Take 1 tablet (500 mg total) by mouth 2 (two) times daily for 7 days.  Dispense: 14 tablet; Refill: 0 - metroNIDAZOLE (FLAGYL) 500 MG tablet; Take 1 tablet (500 mg total) by mouth 3 (three) times daily for 7 days.  Dispense: 21 tablet; Refill: 0  Family/ staff Communication: Reviewed plan of care with patient verbalized understanding   Labs/tests ordered:- CBC with Differential/Platelet  Next Appointment:Return if symptoms worsen or fail to improve.   Caesar Bookman, NP

## 2023-05-17 NOTE — Patient Instructions (Signed)
-   Notify provider or go to ED if symptoms worsen or fail to improve  

## 2023-05-25 ENCOUNTER — Other Ambulatory Visit: Payer: Self-pay | Admitting: Nurse Practitioner

## 2023-05-25 DIAGNOSIS — F902 Attention-deficit hyperactivity disorder, combined type: Secondary | ICD-10-CM

## 2023-05-25 MED ORDER — AMPHETAMINE-DEXTROAMPHET ER 10 MG PO CP24
ORAL_CAPSULE | ORAL | 0 refills | Status: DC
Start: 2023-05-25 — End: 2023-07-06

## 2023-05-25 NOTE — Telephone Encounter (Signed)
Patient has request refill on medication Adderall 10mg . Patient medication last refilled 04/16/2023. Patient has Non Opioid Contract dated 02/08/2023. Medication pend and sent to PCP Janyth Contes Janene Harvey, NP for approval.

## 2023-06-05 DIAGNOSIS — H40021 Open angle with borderline findings, high risk, right eye: Secondary | ICD-10-CM | POA: Diagnosis not present

## 2023-06-19 ENCOUNTER — Other Ambulatory Visit: Payer: Self-pay

## 2023-06-19 DIAGNOSIS — I10 Essential (primary) hypertension: Secondary | ICD-10-CM

## 2023-06-19 DIAGNOSIS — E782 Mixed hyperlipidemia: Secondary | ICD-10-CM

## 2023-06-19 DIAGNOSIS — R972 Elevated prostate specific antigen [PSA]: Secondary | ICD-10-CM

## 2023-07-02 ENCOUNTER — Other Ambulatory Visit: Payer: Medicare Other

## 2023-07-03 ENCOUNTER — Other Ambulatory Visit: Payer: Medicare Other

## 2023-07-03 DIAGNOSIS — E782 Mixed hyperlipidemia: Secondary | ICD-10-CM | POA: Diagnosis not present

## 2023-07-03 DIAGNOSIS — R739 Hyperglycemia, unspecified: Secondary | ICD-10-CM | POA: Diagnosis not present

## 2023-07-03 DIAGNOSIS — I10 Essential (primary) hypertension: Secondary | ICD-10-CM | POA: Diagnosis not present

## 2023-07-06 ENCOUNTER — Ambulatory Visit (INDEPENDENT_AMBULATORY_CARE_PROVIDER_SITE_OTHER): Payer: Medicare Other | Admitting: Nurse Practitioner

## 2023-07-06 ENCOUNTER — Encounter: Payer: Self-pay | Admitting: Nurse Practitioner

## 2023-07-06 VITALS — BP 136/84 | HR 93 | Temp 97.3°F | Ht 70.08 in | Wt 203.0 lb

## 2023-07-06 DIAGNOSIS — R739 Hyperglycemia, unspecified: Secondary | ICD-10-CM | POA: Diagnosis not present

## 2023-07-06 DIAGNOSIS — I10 Essential (primary) hypertension: Secondary | ICD-10-CM | POA: Diagnosis not present

## 2023-07-06 DIAGNOSIS — R972 Elevated prostate specific antigen [PSA]: Secondary | ICD-10-CM | POA: Diagnosis not present

## 2023-07-06 DIAGNOSIS — Z23 Encounter for immunization: Secondary | ICD-10-CM | POA: Diagnosis not present

## 2023-07-06 DIAGNOSIS — E782 Mixed hyperlipidemia: Secondary | ICD-10-CM | POA: Diagnosis not present

## 2023-07-06 DIAGNOSIS — Z9109 Other allergy status, other than to drugs and biological substances: Secondary | ICD-10-CM

## 2023-07-06 DIAGNOSIS — F902 Attention-deficit hyperactivity disorder, combined type: Secondary | ICD-10-CM

## 2023-07-06 LAB — CBC WITH DIFFERENTIAL/PLATELET
Absolute Monocytes: 696 {cells}/uL (ref 200–950)
Basophils Absolute: 67 {cells}/uL (ref 0–200)
Basophils Relative: 0.9 %
Eosinophils Absolute: 570 {cells}/uL — ABNORMAL HIGH (ref 15–500)
Eosinophils Relative: 7.7 %
HCT: 41.5 % (ref 38.5–50.0)
Hemoglobin: 14.3 g/dL (ref 13.2–17.1)
Lymphs Abs: 2723 {cells}/uL (ref 850–3900)
MCH: 33.3 pg — ABNORMAL HIGH (ref 27.0–33.0)
MCHC: 34.5 g/dL (ref 32.0–36.0)
MCV: 96.7 fL (ref 80.0–100.0)
MPV: 10.4 fL (ref 7.5–12.5)
Monocytes Relative: 9.4 %
Neutro Abs: 3345 {cells}/uL (ref 1500–7800)
Neutrophils Relative %: 45.2 %
Platelets: 191 Thousand/uL (ref 140–400)
RBC: 4.29 Million/uL (ref 4.20–5.80)
RDW: 11.9 % (ref 11.0–15.0)
Total Lymphocyte: 36.8 %
WBC: 7.4 Thousand/uL (ref 3.8–10.8)

## 2023-07-06 LAB — LIPID PANEL
Cholesterol: 187 mg/dL
HDL: 66 mg/dL
LDL Cholesterol (Calc): 99 mg/dL
Non-HDL Cholesterol (Calc): 121 mg/dL
Total CHOL/HDL Ratio: 2.8 (calc)
Triglycerides: 127 mg/dL

## 2023-07-06 LAB — COMPLETE METABOLIC PANEL WITHOUT GFR
AG Ratio: 2 (calc) (ref 1.0–2.5)
ALT: 54 U/L — ABNORMAL HIGH (ref 9–46)
AST: 36 U/L — ABNORMAL HIGH (ref 10–35)
Albumin: 4.8 g/dL (ref 3.6–5.1)
Alkaline phosphatase (APISO): 57 U/L (ref 35–144)
BUN: 15 mg/dL (ref 7–25)
CO2: 28 mmol/L (ref 20–32)
Calcium: 10 mg/dL (ref 8.6–10.3)
Chloride: 104 mmol/L (ref 98–110)
Creat: 0.98 mg/dL (ref 0.70–1.35)
Globulin: 2.4 g/dL (ref 1.9–3.7)
Glucose, Bld: 121 mg/dL — ABNORMAL HIGH (ref 65–99)
Potassium: 4.3 mmol/L (ref 3.5–5.3)
Sodium: 141 mmol/L (ref 135–146)
Total Bilirubin: 0.4 mg/dL (ref 0.2–1.2)
Total Protein: 7.2 g/dL (ref 6.1–8.1)
eGFR: 86 mL/min/1.73m2

## 2023-07-06 LAB — TEST AUTHORIZATION

## 2023-07-06 LAB — PSA: PSA: 4.79 ng/mL — ABNORMAL HIGH (ref <=4.00)

## 2023-07-06 LAB — HEMOGLOBIN A1C: Hgb A1c MFr Bld: 5.9 %{Hb} — ABNORMAL HIGH

## 2023-07-06 MED ORDER — AMPHETAMINE-DEXTROAMPHET ER 10 MG PO CP24
ORAL_CAPSULE | ORAL | 0 refills | Status: DC
Start: 2023-07-06 — End: 2023-08-14

## 2023-07-06 NOTE — Progress Notes (Signed)
Careteam: Patient Care Team: Sharon Seller, NP as PCP - General (Geriatric Medicine) Drema Halon, MD (Inactive) as Consulting Physician (Otolaryngology) Violeta Gelinas, MD as Consulting Physician (General Surgery) Meryl Dare, MD as Consulting Physician (Gastroenterology) Emelia Loron, MD as Consulting Physician (General Surgery) Nita Sells, MD (Dermatology) Webb Silversmith, MD (Gastroenterology)  PLACE OF SERVICE:  [facility]  Advanced Directive information Does Patient Have a Medical Advance Directive?: Yes, Type of Advance Directive: Healthcare Power of [place];Living will, Does patient want to make changes to medical advance directive?: No - Patient declined  Allergies  Allergen Reactions   Amitriptyline    Zolpidem Tartrate Other (See Comments)    Severe headache   Amoxicillin-Pot Clavulanate Rash   Propoxyphene Hcl Nausea Only    Chief Complaint  Patient presents with   Medical Management of Chronic Issues    6 month follow up. Pneumonia vaccine today. Discuss need for HIV screening and covid boosters. Patient c/o sinus drainage and headache, thinks it 's related to allergies and opted to get the pneumonia vaccine despite symptoms, per the patient " Let's get the pneumonia vaccine over with."     HPI: Patient is a 67 y.o. male for routine follow up.   Elevated PSA again on labs, no symptoms at this time. No changes in frequency of flow.   Allergies have been bad the past few days. Stopped using flomax due to glaucoma in right eye. Using saline and zyretc 10 mg daily   Liver enzymes elevated drinking 3 to 4 beers and whiskey daily   Eating a lot more pasta- does whole grain.   Went walking and achilles tendon still causing issues when he is active.   Would like to lose about 25 lbs through diet and exercise   He has had a recent flare in IBS but doing well at this time.   Review of Systems:  Review of Systems  Constitutional:   Negative for chills, fever and weight loss.  HENT:  Positive for congestion. Negative for tinnitus.   Respiratory:  Positive for cough. Negative for sputum production and shortness of breath.   Cardiovascular:  Negative for chest pain, palpitations and leg swelling.  Gastrointestinal:  Negative for abdominal pain, constipation, diarrhea and heartburn.  Genitourinary:  Negative for dysuria, frequency and urgency.  Musculoskeletal:  Negative for back pain, falls, joint pain and myalgias.  Skin: Negative.   Neurological:  Negative for dizziness and headaches.  Psychiatric/Behavioral:  Negative for depression and memory loss. The patient does not have insomnia.     Past Medical History:  Diagnosis Date   Abdominal pain, left lower quadrant    Actinic keratosis    ADD (attention deficit disorder)    Alcohol abuse, unspecified    Anxiety    Anxiety state, unspecified    Arthritis    shoulders and hips   Atrial fibrillation (HCC)    Attention deficit disorder without mention of hyperactivity    Cervicalgia    COVID    Depressive disorder, not elsewhere classified    Eczema 11/25/2011   right hand   Edema    Encounter for long-term (current) use of other medications    Glaucoma    Right eye   Headache(784.0)    tension or sinus   High cholesterol    Inguinal hernia 11/25/2011   right   Inguinal hernia without mention of obstruction or gangrene, unilateral or unspecified, (not specified as recurrent)    Insomnia, unspecified  Internal hemorrhoids without mention of complication    Irritable bowel syndrome    Irritable bowel syndrome (IBS)    Lumbago    Major depressive disorder, recurrent episode, severe, without mention of psychotic behavior    Obesity, unspecified    Other and unspecified hyperlipidemia    Other atopic dermatitis and related conditions    Other disorders of vitreous    Other seborrheic keratosis    Other specified visual disturbances    Pain in joint,  pelvic region and thigh    Pain in joint, site unspecified    Pathologic fracture of vertebrae    Poisoning and toxic reactions caused by other specified animals and plants    Recent retinal detachment, partial, with single defect    Routine general medical examination at a health care facility    Sciatica    Sinus infection 11/25/2011   started antibiotic 12/18/2011 x 7 days; current cough   Special screening for malignant neoplasm of prostate    Tension headache    Type II or unspecified type diabetes mellitus without mention of complication, not stated as uncontrolled    Unspecified essential hypertension    Past Surgical History:  Procedure Laterality Date   APPENDECTOMY  1987   CATARACT EXTRACTION  2011   right eye   CATARACT EXTRACTION W/ INTRAOCULAR LENS IMPLANT Left 04/04/2017   Dr. Dawna Part   EYE SURGERY Right 09/13/2018   Dr.Hanes with [facility]    HEMORRHOID SURGERY  04/15/14   thrombosed int. Hemorrhoid. Dr. Violeta Gelinas   HERNIA REPAIR  05/03/2011   left   INGUINAL HERNIA REPAIR  12/28/2011   Procedure: HERNIA REPAIR INGUINAL ADULT;  Surgeon: Emelia Loron, MD;  Location: [facility];  Service: General;  Laterality: Right;   NASAL POLYP SURGERY  01/05/2011   DR. [facility]    NASAL SINUS SURGERY  12/2010   removed anal warts  1976   Dr Terri Piedra    RETINAL DETACHMENT REPAIR W/ SCLERAL BUCKLE LE  07/22/2008   right eye; pars plana vitrectomy   TONSILLECTOMY AND ADENOIDECTOMY  1964   TYMPANOSTOMY TUBE PLACEMENT  June 2015   Dr. Salena Saner. [place]   Social History:   reports that he quit smoking about 26 years ago. His smoking use included cigarettes. He has never used smokeless tobacco. He reports current alcohol use. He reports that he does not use drugs.  Family History  Problem Relation Age of Onset   Stroke Father    Cancer Brother        prostate   Cancer Paternal Grandfather        colon    Medications: Patient's Medications  New Prescriptions    No medications on file  Previous Medications   ACETAMINOPHEN (TYLENOL) 500 MG TABLET    Take 500 mg by mouth as needed.   AMPHETAMINE-DEXTROAMPHETAMINE (ADDERALL XR) 10 MG 24 HR CAPSULE    Take one tablet once a day for ADD   ASPIRIN EC 325 MG TABLET    Take 325 mg by mouth as needed.   CETIRIZINE (ZYRTEC) 10 MG TABLET    Take 10 mg by mouth as needed.   CLONAZEPAM (KLONOPIN) 0.5 MG TABLET    TAKE ONE TABLET BY MOUTH EVERY DAY AS NEEDED FOR ANXIETY   DICYCLOMINE (BENTYL) 20 MG TABLET    TAKE 1 TABLET BY MOUTH EVERY 6 HOURS AS NEEDED FOR PAIN OR IRRITABLE BOWEL SYNDROME   FAMOTIDINE-CALCIUM CARBONATE-MAGNESIUM HYDROXIDE (PEPCID COMPLETE) [phone] MG CHEWABLE TABLET  Chew 1 tablet by mouth daily as needed.   HALOBETASOL (ULTRAVATE) 0.05 % CREAM    APPLY EXTERNALLY TO RASH UP TO THREE TIMES DAILY   LATANOPROST (XALATAN) 0.005 % OPHTHALMIC SOLUTION    Place 1 drop into the right eye at bedtime.   LOSARTAN (COZAAR) 50 MG TABLET    TAKE ONE TABLET BY MOUTH ONCE DAILY   LOVASTATIN (MEVACOR) 20 MG TABLET    TAKE 1 TABLET BY MOUTH NIGHTLY AT BEDTIME   MULTIPLE VITAMIN (MULTIVITAMIN) TABLET    Take 1 tablet by mouth daily.  Modified Medications   No medications on file  Discontinued Medications   No medications on file    Physical Exam:  Vitals:   07/06/23 1033  BP: 136/84  Pulse: 93  Temp: (!) 97.3 F (36.3 C)  TempSrc: Temporal  SpO2: 99%  Weight: 203 lb (92.1 kg)  Height: 5' 10.08" (1.78 m)   Body mass index is 29.07 kg/m. Wt Readings from Last 3 Encounters:  07/06/23 203 lb (92.1 kg)  05/17/23 203 lb 3.2 oz (92.2 kg)  01/01/23 204 lb (92.5 kg)    Physical Exam Constitutional:      General: He is not in acute distress.    Appearance: He is well-developed. He is not diaphoretic.  HENT:     Head: Normocephalic and atraumatic.     Right Ear: External ear normal.     Left Ear: External ear normal.     Nose: Congestion and rhinorrhea present.     Mouth/Throat:      Pharynx: No oropharyngeal exudate.  Eyes:     Conjunctiva/sclera: Conjunctivae normal.     Pupils: Pupils are equal, round, and reactive to light.  Cardiovascular:     Rate and Rhythm: Normal rate and regular rhythm.     Heart sounds: Normal heart sounds.  Pulmonary:     Effort: Pulmonary effort is normal.     Breath sounds: Normal breath sounds.  Abdominal:     General: Bowel sounds are normal.     Palpations: Abdomen is soft.  Musculoskeletal:        General: No tenderness.     Cervical back: Normal range of motion and neck supple.     Right lower leg: No edema.     Left lower leg: No edema.  Skin:    General: Skin is warm and dry.  Neurological:     Mental Status: He is alert and oriented to person, place, and time.  Psychiatric:        Mood and Affect: Mood normal.     Labs reviewed: Basic Metabolic Panel: Recent Labs    12/28/22 0824 07/03/23 0828  NA 138 141  K 4.4 4.3  CL 100 104  CO2 28 28  GLUCOSE 110* 121*  BUN 19 15  CREATININE 0.96 0.98  CALCIUM 9.8 10.0   Liver Function Tests: Recent Labs    12/28/22 0824 07/03/23 0828  AST 36* 36*  ALT 45 54*  BILITOT 0.8 0.4  PROT 7.0 7.2   No results for input(s): "LIPASE", "AMYLASE" in the last 8760 hours. No results for input(s): "AMMONIA" in the last 8760 hours. CBC: Recent Labs    05/17/23 1337 07/03/23 0828  WBC 9.8 7.4  NEUTROABS 5,782 3,345  HGB 13.9 14.3  HCT 41.5 41.5  MCV 95.4 96.7  PLT 196 191   Lipid Panel: Recent Labs    07/03/23 0828  CHOL 187  HDL 66  LDLCALC 99  TRIG  127  CHOLHDL 2.8   TSH: No results for input(s): "TSH" in the last 8760 hours. A1C: Lab Results  Component Value Date   HGBA1C 5.9 (H) 07/03/2023     Assessment/Plan 1. Need for pneumococcal 20-valent conjugate vaccination - Pneumococcal conjugate vaccine 20-valent (Prevnar 20)  2. Attention deficit hyperactivity disorder (ADHD), combined type -continues on adderall, doing well on current treatment   - amphetamine-dextroamphetamine (ADDERALL XR) 10 MG 24 hr capsule; Take one tablet once a day for ADD  Dispense: 30 capsule; Refill: 0  3. Elevated PSA -no symptoms at this time, will continue to monitor.  - PSA; Future  4. Benign essential HTN -Blood pressure well controlled, goal bp <140/90 Continue current medications and dietary modifications follow metabolic panel - COMPLETE METABOLIC PANEL WITH GFR; Future - CBC with Differential/Platelet; Future  5. Mixed hyperlipidemia -continues on lovastatin with dietary modifications  - Lipid panel; Future  6. Environmental allergies Will have him change to xyzal from zyrtec Discussed allergy clinic referral if symptoms do not improve.  - CBC with Differential/Platelet; Future  7. Hyperglycemia Suspected elevated A1c due to 2 round of steroids due to achilles tendinitis  Encouraged dietary modifications and will follow up A1c prior to next visit.  - Hemoglobin A1c; Future    Return in about 6 months (around 01/06/2024) for routine follow up, labs prior .  Janene Harvey. Biagio Borg [facility] [phone]

## 2023-07-24 ENCOUNTER — Other Ambulatory Visit: Payer: Self-pay

## 2023-07-24 DIAGNOSIS — F419 Anxiety disorder, unspecified: Secondary | ICD-10-CM

## 2023-07-24 MED ORDER — CLONAZEPAM 0.5 MG PO TABS
0.5000 mg | ORAL_TABLET | Freq: Every day | ORAL | 2 refills | Status: DC
Start: 2023-07-24 — End: 2023-11-12

## 2023-07-24 NOTE — Telephone Encounter (Signed)
Refill request received from pharmacy for Clonazepam 0.5 mg tablet. Medication was last refilled 03/22/23 with 2 refills. Patient has up to date treatment agreement on file.  Medication pended and sent to [name], NP

## 2023-08-14 ENCOUNTER — Other Ambulatory Visit: Payer: Self-pay | Admitting: Nurse Practitioner

## 2023-08-14 DIAGNOSIS — F902 Attention-deficit hyperactivity disorder, combined type: Secondary | ICD-10-CM

## 2023-08-14 MED ORDER — AMPHETAMINE-DEXTROAMPHET ER 10 MG PO CP24
ORAL_CAPSULE | ORAL | 0 refills | Status: DC
Start: 2023-08-14 — End: 2023-09-18

## 2023-08-14 NOTE — Telephone Encounter (Signed)
Patient has request refill on medication Adderall. Patient medication last refilled 07/06/2023. Patient has Non Opioid Contract on file dated 02/12/2023. Patient medication pend and sent to PCP Janyth Contes Janene Harvey, NP for approval.

## 2023-09-18 ENCOUNTER — Other Ambulatory Visit: Payer: Self-pay | Admitting: Nurse Practitioner

## 2023-09-18 DIAGNOSIS — F902 Attention-deficit hyperactivity disorder, combined type: Secondary | ICD-10-CM

## 2023-09-18 MED ORDER — AMPHETAMINE-DEXTROAMPHET ER 10 MG PO CP24
ORAL_CAPSULE | ORAL | 0 refills | Status: DC
Start: 2023-09-18 — End: 2023-10-23

## 2023-09-18 NOTE — Telephone Encounter (Signed)
Patient requested refill.  Epic LR: 08/14/2023 Contract Date: 01/01/2023  Pended Rx and sent to [facility] for approval.

## 2023-10-10 ENCOUNTER — Encounter: Payer: Self-pay | Admitting: Nurse Practitioner

## 2023-10-11 ENCOUNTER — Other Ambulatory Visit: Payer: Self-pay

## 2023-10-11 DIAGNOSIS — E785 Hyperlipidemia, unspecified: Secondary | ICD-10-CM

## 2023-10-11 DIAGNOSIS — I10 Essential (primary) hypertension: Secondary | ICD-10-CM

## 2023-10-11 MED ORDER — LOVASTATIN 20 MG PO TABS
ORAL_TABLET | ORAL | 3 refills | Status: DC
Start: 2023-10-11 — End: 2024-10-16

## 2023-10-11 MED ORDER — LOSARTAN POTASSIUM 50 MG PO TABS
50.0000 mg | ORAL_TABLET | Freq: Every day | ORAL | 3 refills | Status: DC
Start: 2023-10-11 — End: 2024-10-16

## 2023-10-11 NOTE — Addendum Note (Signed)
Addended by: Maurice Small on: 10/11/2023 10:35 AM   Modules accepted: Orders

## 2023-10-23 ENCOUNTER — Other Ambulatory Visit: Payer: Self-pay | Admitting: Nurse Practitioner

## 2023-10-23 DIAGNOSIS — F902 Attention-deficit hyperactivity disorder, combined type: Secondary | ICD-10-CM

## 2023-10-23 MED ORDER — AMPHETAMINE-DEXTROAMPHET ER 10 MG PO CP24
ORAL_CAPSULE | ORAL | 0 refills | Status: DC
Start: 2023-10-23 — End: 2023-12-03

## 2023-10-23 NOTE — Telephone Encounter (Signed)
Patient is requesting a refill of the following medications: Requested Prescriptions   Pending Prescriptions Disp Refills   amphetamine-dextroamphetamine (ADDERALL XR) 10 MG 24 hr capsule 30 capsule 0    Sig: Take one tablet once a day for ADD    Date of last refill: 09/18/23  Refill amount: 0  Treatment agreement date: 01/01/23

## 2023-11-05 DIAGNOSIS — H524 Presbyopia: Secondary | ICD-10-CM | POA: Diagnosis not present

## 2023-11-05 DIAGNOSIS — H338 Other retinal detachments: Secondary | ICD-10-CM | POA: Diagnosis not present

## 2023-11-05 DIAGNOSIS — H52223 Regular astigmatism, bilateral: Secondary | ICD-10-CM | POA: Diagnosis not present

## 2023-11-05 DIAGNOSIS — H26493 Other secondary cataract, bilateral: Secondary | ICD-10-CM | POA: Diagnosis not present

## 2023-11-05 DIAGNOSIS — H40021 Open angle with borderline findings, high risk, right eye: Secondary | ICD-10-CM | POA: Diagnosis not present

## 2023-11-05 DIAGNOSIS — Z961 Presence of intraocular lens: Secondary | ICD-10-CM | POA: Diagnosis not present

## 2023-11-05 DIAGNOSIS — H5213 Myopia, bilateral: Secondary | ICD-10-CM | POA: Diagnosis not present

## 2023-11-12 ENCOUNTER — Other Ambulatory Visit: Payer: Self-pay | Admitting: *Deleted

## 2023-11-12 DIAGNOSIS — F419 Anxiety disorder, unspecified: Secondary | ICD-10-CM

## 2023-11-12 MED ORDER — CLONAZEPAM 0.5 MG PO TABS: 0.5000 mg | ORAL_TABLET | Freq: Every day | ORAL | 2 refills | Status: DC

## 2023-11-12 MED ORDER — CLONAZEPAM 0.5 MG PO TABS
0.5000 mg | ORAL_TABLET | Freq: Every day | ORAL | 2 refills | Status: DC
Start: 2023-11-12 — End: 2024-01-11

## 2023-11-12 NOTE — Telephone Encounter (Signed)
[facility] requested refill.  Pended Rx and sent to [facility] for approval.  Last RF: 07/24/2023

## 2023-12-03 ENCOUNTER — Other Ambulatory Visit: Payer: Self-pay | Admitting: Nurse Practitioner

## 2023-12-03 DIAGNOSIS — F902 Attention-deficit hyperactivity disorder, combined type: Secondary | ICD-10-CM

## 2023-12-04 MED ORDER — AMPHETAMINE-DEXTROAMPHET ER 10 MG PO CP24
ORAL_CAPSULE | ORAL | 0 refills | Status: DC
Start: 2023-12-04 — End: 2024-01-11

## 2023-12-04 NOTE — Telephone Encounter (Signed)
Patient has request refill on medication Adderall. Patient medication last refilled 10/23/2023. Patient has Non Opioid Contract on filed dated 02/12/2023. Medication pend and sent to PCP Janyth Contes Janene Harvey, NP

## 2024-01-08 ENCOUNTER — Other Ambulatory Visit: Payer: Medicare Other

## 2024-01-08 DIAGNOSIS — Z9109 Other allergy status, other than to drugs and biological substances: Secondary | ICD-10-CM

## 2024-01-08 DIAGNOSIS — R739 Hyperglycemia, unspecified: Secondary | ICD-10-CM

## 2024-01-08 DIAGNOSIS — R972 Elevated prostate specific antigen [PSA]: Secondary | ICD-10-CM

## 2024-01-08 DIAGNOSIS — I10 Essential (primary) hypertension: Secondary | ICD-10-CM | POA: Diagnosis not present

## 2024-01-08 DIAGNOSIS — E782 Mixed hyperlipidemia: Secondary | ICD-10-CM | POA: Diagnosis not present

## 2024-01-09 DIAGNOSIS — C44519 Basal cell carcinoma of skin of other part of trunk: Secondary | ICD-10-CM | POA: Diagnosis not present

## 2024-01-09 DIAGNOSIS — L739 Follicular disorder, unspecified: Secondary | ICD-10-CM | POA: Diagnosis not present

## 2024-01-09 DIAGNOSIS — B958 Unspecified staphylococcus as the cause of diseases classified elsewhere: Secondary | ICD-10-CM | POA: Diagnosis not present

## 2024-01-09 DIAGNOSIS — L820 Inflamed seborrheic keratosis: Secondary | ICD-10-CM | POA: Diagnosis not present

## 2024-01-09 LAB — COMPLETE METABOLIC PANEL WITH GFR
AG Ratio: 1.7 (calc) (ref 1.0–2.5)
ALT: 61 U/L — ABNORMAL HIGH (ref 9–46)
AST: 53 U/L — ABNORMAL HIGH (ref 10–35)
Albumin: 4.4 g/dL (ref 3.6–5.1)
Alkaline phosphatase (APISO): 73 U/L (ref 35–144)
BUN: 17 mg/dL (ref 7–25)
CO2: 29 mmol/L (ref 20–32)
Calcium: 9.6 mg/dL (ref 8.6–10.3)
Chloride: 100 mmol/L (ref 98–110)
Creat: 0.93 mg/dL (ref 0.70–1.35)
Globulin: 2.6 g/dL (ref 1.9–3.7)
Glucose, Bld: 128 mg/dL — ABNORMAL HIGH (ref 65–99)
Potassium: 4.5 mmol/L (ref 3.5–5.3)
Sodium: 140 mmol/L (ref 135–146)
Total Bilirubin: 0.8 mg/dL (ref 0.2–1.2)
Total Protein: 7 g/dL (ref 6.1–8.1)
eGFR: 91 mL/min/{1.73_m2} (ref >=60)

## 2024-01-09 LAB — HEMOGLOBIN A1C
Hgb A1c MFr Bld: 6 %{Hb} — ABNORMAL HIGH (ref <5.7)
Mean Plasma Glucose: 126 mg/dL
eAG (mmol/L): 7 mmol/L

## 2024-01-09 LAB — CBC WITH DIFFERENTIAL/PLATELET
Absolute Lymphocytes: 2317 {cells}/uL (ref 850–3900)
Absolute Monocytes: 819 {cells}/uL (ref 200–950)
Basophils Absolute: 39 {cells}/uL (ref 0–200)
Basophils Relative: 0.5 %
Eosinophils Absolute: 499 {cells}/uL (ref 15–500)
Eosinophils Relative: 6.4 %
HCT: 42.5 % (ref 38.5–50.0)
Hemoglobin: 14.2 g/dL (ref 13.2–17.1)
MCH: 32.3 pg (ref 27.0–33.0)
MCHC: 33.4 g/dL (ref 32.0–36.0)
MCV: 96.8 fL (ref 80.0–100.0)
MPV: 10.3 fL (ref 7.5–12.5)
Monocytes Relative: 10.5 %
Neutro Abs: 4126 {cells}/uL (ref 1500–7800)
Neutrophils Relative %: 52.9 %
Platelets: 172 10*3/uL (ref 140–400)
RBC: 4.39 10*6/uL (ref 4.20–5.80)
RDW: 12.1 % (ref 11.0–15.0)
Total Lymphocyte: 29.7 %
WBC: 7.8 10*3/uL (ref 3.8–10.8)

## 2024-01-09 LAB — PSA: PSA: 4.76 ng/mL — ABNORMAL HIGH (ref <=4.00)

## 2024-01-09 LAB — LIPID PANEL
Cholesterol: 183 mg/dL (ref <200)
HDL: 67 mg/dL (ref >=40)
LDL Cholesterol (Calc): 97 mg/dL
Non-HDL Cholesterol (Calc): 116 mg/dL (ref <130)
Total CHOL/HDL Ratio: 2.7 (calc) (ref <5.0)
Triglycerides: 99 mg/dL (ref <150)

## 2024-01-11 ENCOUNTER — Ambulatory Visit (INDEPENDENT_AMBULATORY_CARE_PROVIDER_SITE_OTHER): Payer: Medicare Other | Admitting: Nurse Practitioner

## 2024-01-11 ENCOUNTER — Encounter: Payer: Self-pay | Admitting: Nurse Practitioner

## 2024-01-11 ENCOUNTER — Other Ambulatory Visit: Payer: Self-pay

## 2024-01-11 VITALS — BP 160/100 | HR 85 | Temp 97.0°F | Resp 16 | Ht 70.08 in | Wt 203.0 lb

## 2024-01-11 DIAGNOSIS — F419 Anxiety disorder, unspecified: Secondary | ICD-10-CM | POA: Diagnosis not present

## 2024-01-11 DIAGNOSIS — I10 Essential (primary) hypertension: Secondary | ICD-10-CM

## 2024-01-11 DIAGNOSIS — K219 Gastro-esophageal reflux disease without esophagitis: Secondary | ICD-10-CM

## 2024-01-11 DIAGNOSIS — F10939 Alcohol use, unspecified with withdrawal, unspecified: Secondary | ICD-10-CM

## 2024-01-11 DIAGNOSIS — F902 Attention-deficit hyperactivity disorder, combined type: Secondary | ICD-10-CM | POA: Diagnosis not present

## 2024-01-11 DIAGNOSIS — H60501 Unspecified acute noninfective otitis externa, right ear: Secondary | ICD-10-CM | POA: Diagnosis not present

## 2024-01-11 DIAGNOSIS — R972 Elevated prostate specific antigen [PSA]: Secondary | ICD-10-CM

## 2024-01-11 MED ORDER — TOBRAMYCIN-DEXAMETHASONE 0.3-0.1 % OP SUSP: OPHTHALMIC | 0 refills | Status: DC

## 2024-01-11 MED ORDER — CLONAZEPAM 0.5 MG PO TABS: 0.5000 mg | ORAL_TABLET | Freq: Three times a day (TID) | ORAL | 0 refills | Status: DC | PRN

## 2024-01-11 MED ORDER — CIPROFLOXACIN-DEXAMETHASONE 0.3-0.1 % OT SUSP: 4.0000 [drp] | Freq: Two times a day (BID) | OTIC | 0 refills | Status: DC

## 2024-01-11 MED ORDER — AMPHETAMINE-DEXTROAMPHET ER 10 MG PO CP24: ORAL_CAPSULE | ORAL | 0 refills | Status: DC

## 2024-01-11 MED ORDER — NEOMYCIN-POLYMYXIN-HC 1 % OT SOLN: 3.0000 [drp] | Freq: Four times a day (QID) | OTIC | 0 refills | Status: DC

## 2024-01-11 NOTE — Telephone Encounter (Signed)
New rx sent in

## 2024-01-11 NOTE — Telephone Encounter (Signed)
Patient pharmacy is calling because they do not have the ear drops and probably will not have them until Monday.they either have tobradex eye drops or neopolydex solution. Either one would need a new prescription

## 2024-01-11 NOTE — Telephone Encounter (Signed)
Pharmacy called back to say that they received the 2nd rx however it was not one of the options they suggested and the new rx is not one that they have available. Please select one of the suggested rx's

## 2024-01-11 NOTE — Progress Notes (Signed)
Careteam: Patient Care Team: Sharon Seller, NP as PCP - General (Geriatric Medicine) Drema Halon, MD (Inactive) as Consulting Physician (Otolaryngology) Violeta Gelinas, MD as Consulting Physician (General Surgery) Meryl Dare, MD (Inactive) as Consulting Physician (Gastroenterology) Emelia Loron, MD as Consulting Physician (General Surgery) Nita Sells, MD (Dermatology) Webb Silversmith, MD (Gastroenterology)  PLACE OF SERVICE:  [facility]  Advanced Directive information Does Patient Have a Medical Advance Directive?: Yes, Type of Advance Directive: Healthcare Power of [place];Living will;Out of facility DNR (pink MOST or yellow form), Does patient want to make changes to medical advance directive?: No - Patient declined  Allergies  Allergen Reactions   Amitriptyline    Zolpidem Tartrate Other (See Comments)    Severe headache   Amoxicillin-Pot Clavulanate Rash   Propoxyphene Hcl Nausea Only    Chief Complaint  Patient presents with   Medical Management of Chronic Issues    6 month follow up and Discuss labs.    Immunizations    Discuss the need for Covid Booster, and DTAP vaccine. NCIR VERIFIED   Concerns     Recently quit drinking for 4 days and having withdraws.      HPI: Patient is a 68 y.o. male for follow up He has been increasing his drinking gradually over time.  Reports he was drinking up to half a fifth some days, plus beer in the last year it has gotten worse.  Felt like he needed to stop so he drank half a beer 4 days ago and has not had anything since.  Now experiencing cravings, shaking, anxiety.  No nausea, vomiting, diarrhea or headaches No seizures  No hallucinations  The clonazepam has helped but feels like he needs more than once daily  Wife is being a great support.    Right ear with pain on and off  No drainage, feels full Hearing had decreased Was also taking zyrtec but stopped Review of Systems:  Review of  Systems  Constitutional:  Positive for malaise/fatigue. Negative for chills, fever and weight loss.  HENT:  Positive for ear pain and hearing loss. Negative for congestion, ear discharge, sinus pain, sore throat and tinnitus.   Respiratory:  Negative for cough, sputum production and shortness of breath.   Cardiovascular:  Negative for chest pain, palpitations and leg swelling.  Gastrointestinal:  Negative for abdominal pain, constipation, diarrhea and heartburn.  Genitourinary:  Negative for dysuria, frequency and urgency.  Musculoskeletal:  Negative for back pain, falls, joint pain and myalgias.  Skin: Negative.   Neurological:  Positive for tremors. Negative for dizziness and headaches.  Psychiatric/Behavioral:  Negative for depression, hallucinations and memory loss. The patient is nervous/anxious. The patient does not have insomnia.     Past Medical History:  Diagnosis Date   Abdominal pain, left lower quadrant    Actinic keratosis    ADD (attention deficit disorder)    Alcohol abuse, unspecified    Anxiety    Anxiety state, unspecified    Arthritis    shoulders and hips   Atrial fibrillation (HCC)    Attention deficit disorder without mention of hyperactivity    Cervicalgia    COVID    Depressive disorder, not elsewhere classified    Eczema 11/25/2011   right hand   Edema    Encounter for long-term (current) use of other medications    Glaucoma    Right eye   Headache(784.0)    tension or sinus   High cholesterol    Inguinal  hernia 11/25/2011   right   Inguinal hernia without mention of obstruction or gangrene, unilateral or unspecified, (not specified as recurrent)    Insomnia, unspecified    Internal hemorrhoids without mention of complication    Irritable bowel syndrome    Irritable bowel syndrome (IBS)    Lumbago    Major depressive disorder, recurrent episode, severe, without mention of psychotic behavior    Obesity, unspecified    Other and unspecified  hyperlipidemia    Other atopic dermatitis and related conditions    Other disorders of vitreous    Other seborrheic keratosis    Other specified visual disturbances    Pain in joint, pelvic region and thigh    Pain in joint, site unspecified    Pathologic fracture of vertebrae    Poisoning and toxic reactions caused by other specified animals and plants    Recent retinal detachment, partial, with single defect    Routine general medical examination at a health care facility    Sciatica    Sinus infection 11/25/2011   started antibiotic 12/18/2011 x 7 days; current cough   Special screening for malignant neoplasm of prostate    Tension headache    Type II or unspecified type diabetes mellitus without mention of complication, not stated as uncontrolled    Unspecified essential hypertension    Past Surgical History:  Procedure Laterality Date   APPENDECTOMY  1987   CATARACT EXTRACTION  2011   right eye   CATARACT EXTRACTION W/ INTRAOCULAR LENS IMPLANT Left 04/04/2017   Dr. Dawna Part   EYE SURGERY Right 09/13/2018   Dr.Hanes with [facility]    HEMORRHOID SURGERY  04/15/14   thrombosed int. Hemorrhoid. Dr. Violeta Gelinas   HERNIA REPAIR  05/03/2011   left   INGUINAL HERNIA REPAIR  12/28/2011   Procedure: HERNIA REPAIR INGUINAL ADULT;  Surgeon: Emelia Loron, MD;  Location: [facility];  Service: General;  Laterality: Right;   NASAL POLYP SURGERY  01/05/2011   DR. [facility]    NASAL SINUS SURGERY  12/2010   removed anal warts  1976   Dr Terri Piedra    RETINAL DETACHMENT REPAIR W/ SCLERAL BUCKLE LE  07/22/2008   right eye; pars plana vitrectomy   TONSILLECTOMY AND ADENOIDECTOMY  1964   TYMPANOSTOMY TUBE PLACEMENT  June 2015   Dr. Salena Saner. [place]   Social History:   reports that he quit smoking about 26 years ago. His smoking use included cigarettes. He has never used smokeless tobacco. He reports that he does not currently use alcohol. He reports that he does not use  drugs.  Family History  Problem Relation Age of Onset   Stroke Father    Cancer Brother        prostate   Cancer Paternal Grandfather        colon    Medications: Patient's Medications  New Prescriptions   NEOMYCIN-POLYMYXIN-HYDROCORTISONE (CORTISPORIN) 1 % SOLN OTIC SOLUTION    Place 3 drops into the right ear 4 (four) times daily.  Previous Medications   ACETAMINOPHEN (TYLENOL) 500 MG TABLET    Take 500 mg by mouth as needed.   ASPIRIN EC 325 MG TABLET    Take 325 mg by mouth as needed.   CETIRIZINE (ZYRTEC) 10 MG TABLET    Take 10 mg by mouth as needed.   DICYCLOMINE (BENTYL) 20 MG TABLET    TAKE 1 TABLET BY MOUTH EVERY 6 HOURS AS NEEDED FOR PAIN OR IRRITABLE BOWEL SYNDROME  HALOBETASOL (ULTRAVATE) 0.05 % CREAM    APPLY EXTERNALLY TO RASH UP TO THREE TIMES DAILY   LATANOPROST (XALATAN) 0.005 % OPHTHALMIC SOLUTION    Place 1 drop into both eyes at bedtime.   LOSARTAN (COZAAR) 50 MG TABLET    Take 1 tablet (50 mg total) by mouth daily.   LOVASTATIN (MEVACOR) 20 MG TABLET    TAKE 1 TABLET BY MOUTH NIGHTLY AT BEDTIME   MULTIPLE VITAMIN (MULTIVITAMIN) TABLET    Take 1 tablet by mouth daily.   MUPIROCIN OINTMENT (BACTROBAN) 2 %    Apply 1 Application topically at bedtime. Swab both nostrils.   OMEPRAZOLE (PRILOSEC) 20 MG CAPSULE    Take 20 mg by mouth every other day.  Modified Medications   Modified Medication Previous Medication   AMPHETAMINE-DEXTROAMPHETAMINE (ADDERALL XR) 10 MG 24 HR CAPSULE amphetamine-dextroamphetamine (ADDERALL XR) 10 MG 24 hr capsule      Take one tablet once a day for ADD    Take one tablet once a day for ADD   CLONAZEPAM (KLONOPIN) 0.5 MG TABLET clonazePAM (KLONOPIN) 0.5 MG tablet      Take 1 tablet (0.5 mg total) by mouth 3 (three) times daily as needed for anxiety.    Take 1 tablet (0.5 mg total) by mouth daily.  Discontinued Medications   FAMOTIDINE-CALCIUM CARBONATE-MAGNESIUM HYDROXIDE (PEPCID COMPLETE) 10-800-165 MG CHEWABLE TABLET    Chew 1 tablet by  mouth daily as needed.    Physical Exam:  Vitals:   01/11/24 0947 01/11/24 1243  BP: (!) 172/100 (!) 160/100  Pulse: 85   Resp: 16   Temp: (!) 97 F (36.1 C)   SpO2: 97%   Weight: 203 lb (92.1 kg)   Height: 5' 10.08" (1.78 m)    Body mass index is 29.06 kg/m. Wt Readings from Last 3 Encounters:  01/11/24 203 lb (92.1 kg)  07/06/23 203 lb (92.1 kg)  05/17/23 203 lb 3.2 oz (92.2 kg)    Physical Exam Constitutional:      General: He is not in acute distress.    Appearance: He is well-developed. He is not diaphoretic.  HENT:     Head: Normocephalic and atraumatic.     Right Ear: External ear normal.     Left Ear: External ear normal.     Mouth/Throat:     Pharynx: No oropharyngeal exudate.  Eyes:     Conjunctiva/sclera: Conjunctivae normal.     Pupils: Pupils are equal, round, and reactive to light.  Cardiovascular:     Rate and Rhythm: Normal rate and regular rhythm.     Heart sounds: Normal heart sounds.  Pulmonary:     Effort: Pulmonary effort is normal.     Breath sounds: Normal breath sounds.  Abdominal:     General: Bowel sounds are normal.     Palpations: Abdomen is soft.  Musculoskeletal:        General: No tenderness.     Cervical back: Normal range of motion and neck supple.     Right lower leg: No edema.     Left lower leg: No edema.  Skin:    General: Skin is warm and dry.  Neurological:     Mental Status: He is alert and oriented to person, place, and time.     Labs reviewed: Basic Metabolic Panel: Recent Labs    07/03/23 0828 01/08/24 0809  NA 141 140  K 4.3 4.5  CL 104 100  CO2 28 29  GLUCOSE 121* 128*  BUN  15 17  CREATININE 0.98 0.93  CALCIUM 10.0 9.6   Liver Function Tests: Recent Labs    07/03/23 0828 01/08/24 0809  AST 36* 53*  ALT 54* 61*  BILITOT 0.4 0.8  PROT 7.2 7.0   No results for input(s): "LIPASE", "AMYLASE" in the last 8760 hours. No results for input(s): "AMMONIA" in the last 8760 hours. CBC: Recent Labs     05/17/23 1337 07/03/23 0828 01/08/24 0809  WBC 9.8 7.4 7.8  NEUTROABS 5,782 3,345 4,126  HGB 13.9 14.3 14.2  HCT 41.5 41.5 42.5  MCV 95.4 96.7 96.8  PLT 196 191 172   Lipid Panel: Recent Labs    07/03/23 0828 01/08/24 0809  CHOL 187 183  HDL 66 67  LDLCALC 99 97  TRIG 127 99  CHOLHDL 2.8 2.7   TSH: No results for input(s): "TSH" in the last 8760 hours. A1C: Lab Results  Component Value Date   HGBA1C 6.0 (H) 01/08/2024     Assessment/Plan 1. Acute otitis externa of right ear, unspecified type (Primary) - NEOMYCIN-POLYMYXIN-HYDROCORTISONE (CORTISPORIN) 1 % SOLN OTIC solution; Place 3 drops into the right ear 4 (four) times daily.  Dispense: 10 mL; Refill: 0  2. Alcohol withdrawal syndrome with complication (HCC) -having tremors with anxiety and agitation -no N/V/D at this time He is on day 4 of symptoms His klonopin has helped symptoms but only once daily at this time- will have him increase during time of withdrawals- to take up to 3 times daily for next 3 days then decrease to twice daily and then once daily as needed   3. Attention deficit hyperactivity disorder (ADHD), combined type - amphetamine-dextroamphetamine (ADDERALL XR) 10 MG 24 hr capsule; Take one tablet once a day for ADD  Dispense: 30 capsule; Refill: 0  4. Anxiety -worse while having withdrawal from ETOH, will temporally increase klonopin to help with symptoms  - clonazePAM (KLONOPIN) 0.5 MG tablet; Take 1 tablet (0.5 mg total) by mouth 3 (three) times daily as needed for anxiety.  Dispense: 90 tablet; Refill: 0  5. Elevated PSA Stable on recent labs  6. Gastroesophageal reflux disease without esophagitis Improved with omeprazole. Will continue at this time   7. Benign essential HTN Elevated today however having tremors and anxiety due to ETOH withdrawal Will have him monitor BP at home.    Follow up in 4 days virtually   Connor [name] K. Biagio Borg [facility] [phone]

## 2024-01-11 NOTE — Patient Instructions (Signed)
Add zyrtec 10 mg daily back for ears/sinuses Use ear drops for 7 days

## 2024-01-14 ENCOUNTER — Encounter: Payer: Self-pay | Admitting: Nurse Practitioner

## 2024-01-15 ENCOUNTER — Telehealth (INDEPENDENT_AMBULATORY_CARE_PROVIDER_SITE_OTHER): Payer: Medicare Other | Admitting: Nurse Practitioner

## 2024-01-15 ENCOUNTER — Telehealth: Payer: Self-pay | Admitting: *Deleted

## 2024-01-15 DIAGNOSIS — F419 Anxiety disorder, unspecified: Secondary | ICD-10-CM | POA: Diagnosis not present

## 2024-01-15 DIAGNOSIS — F10239 Alcohol dependence with withdrawal, unspecified: Secondary | ICD-10-CM | POA: Diagnosis not present

## 2024-01-15 DIAGNOSIS — F10939 Alcohol use, unspecified with withdrawal, unspecified: Secondary | ICD-10-CM

## 2024-01-15 MED ORDER — CLONAZEPAM 0.5 MG PO TABS
0.5000 mg | ORAL_TABLET | Freq: Every day | ORAL | Status: DC | PRN
Start: 2024-01-15 — End: 2024-03-25

## 2024-01-15 NOTE — Progress Notes (Unsigned)
This service is provided via telemedicine  No vital signs collected/recorded due to the encounter was a telemedicine visit.   Location of patient (ex: home, work):  Home  Patient consents to a telephone visit:  Yes  Location of the provider (ex: office, home):  Office [place].   Name of any referring provider:  na  Names of all persons participating in the telemedicine service and their role in the encounter:  Connor [name], Patient, [name], CMA, [name], NP  Time spent on call:  7:10

## 2024-01-15 NOTE — Progress Notes (Unsigned)
Careteam: Patient Care Team: Sharon Seller, NP as PCP - General (Geriatric Medicine) Drema Halon, MD (Inactive) as Consulting Physician (Otolaryngology) Violeta Gelinas, MD as Consulting Physician (General Surgery) Meryl Dare, MD (Inactive) as Consulting Physician (Gastroenterology) Emelia Loron, MD as Consulting Physician (General Surgery) Nita Sells, MD (Dermatology) Webb Silversmith, MD (Gastroenterology)  Advanced Directive information Does Patient Have a Medical Advance Directive?: Yes, Type of Advance Directive: Healthcare Power of [place];Out of facility DNR (pink MOST or yellow form);Living will, Does patient want to make changes to medical advance directive?: No - Patient declined  Allergies  Allergen Reactions  . Amitriptyline   . Zolpidem Tartrate Other (See Comments)    Severe headache  . Amoxicillin-Pot Clavulanate Rash  . Propoxyphene Hcl Nausea Only    Chief Complaint  Patient presents with  . Acute Visit    Follow up from ETOH withdrawals     HPI: Patient is a 68 y.o. male for follow up on alcohol withdrawals.  Repots he is still feeling a little antsy and anxiety. He got new Rx for clonazepam TID he is taking 2-3 times daily which has helped.  Emotions are high.  Still having alcohol cravings  He plans to take some herbal supplements.  Drinking a lot of water and adding lemons and limes.  No GI symptoms at this time No hallucinations or seizures Continues to have great support from his wife who has gone through a similar situation.   Reports his ear is doing better, not itching as much.  Review of Systems:  Review of Systems  Constitutional:  Negative for chills, fever and weight loss.  HENT:  Negative for tinnitus.   Respiratory:  Negative for cough, sputum production and shortness of breath.   Cardiovascular:  Negative for chest pain, palpitations and leg swelling.  Gastrointestinal:  Negative for abdominal pain,  constipation, diarrhea and heartburn.  Genitourinary:  Negative for dysuria, frequency and urgency.  Musculoskeletal:  Negative for back pain, falls, joint pain and myalgias.  Skin: Negative.   Neurological:  Negative for dizziness and headaches.  Psychiatric/Behavioral:  Negative for depression and memory loss. The patient is nervous/anxious. The patient does not have insomnia.     Past Medical History:  Diagnosis Date  . Abdominal pain, left lower quadrant   . Actinic keratosis   . ADD (attention deficit disorder)   . Alcohol abuse, unspecified   . Anxiety   . Anxiety state, unspecified   . Arthritis    shoulders and hips  . Atrial fibrillation (HCC)   . Attention deficit disorder without mention of hyperactivity   . Cervicalgia   . COVID   . Depressive disorder, not elsewhere classified   . Eczema 11/25/2011   right hand  . Edema   . Encounter for long-term (current) use of other medications   . Glaucoma    Right eye  . Headache(784.0)    tension or sinus  . High cholesterol   . Inguinal hernia 11/25/2011   right  . Inguinal hernia without mention of obstruction or gangrene, unilateral or unspecified, (not specified as recurrent)   . Insomnia, unspecified   . Internal hemorrhoids without mention of complication   . Irritable bowel syndrome   . Irritable bowel syndrome (IBS)   . Lumbago   . Major depressive disorder, recurrent episode, severe, without mention of psychotic behavior   . Obesity, unspecified   . Other and unspecified hyperlipidemia   . Other atopic dermatitis and related conditions   .  Other disorders of vitreous   . Other seborrheic keratosis   . Other specified visual disturbances   . Pain in joint, pelvic region and thigh   . Pain in joint, site unspecified   . Pathologic fracture of vertebrae   . Poisoning and toxic reactions caused by other specified animals and plants   . Recent retinal detachment, partial, with single defect   . Routine  general medical examination at a health care facility   . Sciatica   . Sinus infection 11/25/2011   started antibiotic 12/18/2011 x 7 days; current cough  . Special screening for malignant neoplasm of prostate   . Tension headache   . Type II or unspecified type diabetes mellitus without mention of complication, not stated as uncontrolled   . Unspecified essential hypertension    Past Surgical History:  Procedure Laterality Date  . APPENDECTOMY  1987  . CATARACT EXTRACTION  2011   right eye  . CATARACT EXTRACTION W/ INTRAOCULAR LENS IMPLANT Left 04/04/2017   Dr. Dawna Part  . EYE SURGERY Right 09/13/2018   Dr.Hanes with [facility]   . HEMORRHOID SURGERY  04/15/14   thrombosed int. Hemorrhoid. Dr. Violeta Gelinas  . HERNIA REPAIR  05/03/2011   left  . INGUINAL HERNIA REPAIR  12/28/2011   Procedure: HERNIA REPAIR INGUINAL ADULT;  Surgeon: Emelia Loron, MD;  Location: [facility];  Service: General;  Laterality: Right;  . NASAL POLYP SURGERY  01/05/2011   DR. NEWMAN   . NASAL SINUS SURGERY  12/2010  . removed anal warts  1976   Dr Terri Piedra   . RETINAL DETACHMENT REPAIR W/ SCLERAL BUCKLE LE  07/22/2008   right eye; pars plana vitrectomy  . TONSILLECTOMY AND ADENOIDECTOMY  1964  . TYMPANOSTOMY TUBE PLACEMENT  June 2015   Dr. Salena Saner. [facility]   Social History:   reports that he quit smoking about 26 years ago. His smoking use included cigarettes. He has never used smokeless tobacco. He reports that he does not currently use alcohol. He reports that he does not use drugs.  Family History  Problem Relation Age of Onset  . Stroke Father   . Cancer Brother        prostate  . Cancer Paternal Grandfather        colon    Medications: Patient's Medications  New Prescriptions   No medications on file  Previous Medications   ACETAMINOPHEN (TYLENOL) 500 MG TABLET    Take 500 mg by mouth as needed.   AMPHETAMINE-DEXTROAMPHETAMINE (ADDERALL XR) 10 MG 24 HR CAPSULE    Take one  tablet once a day for ADD   ASHWAGANDHA PO    Take one by mouth daily.   ASPIRIN EC 325 MG TABLET    Take 325 mg by mouth as needed.   CETIRIZINE (ZYRTEC) 10 MG TABLET    Take 10 mg by mouth as needed.   DICYCLOMINE (BENTYL) 20 MG TABLET    TAKE 1 TABLET BY MOUTH EVERY 6 HOURS AS NEEDED FOR PAIN OR IRRITABLE BOWEL SYNDROME   HALOBETASOL (ULTRAVATE) 0.05 % CREAM    APPLY EXTERNALLY TO RASH UP TO THREE TIMES DAILY   KUDZU POWD    Take one capsule by mouth daily.   LATANOPROST (XALATAN) 0.005 % OPHTHALMIC SOLUTION    Place 1 drop into both eyes at bedtime.   LOSARTAN (COZAAR) 50 MG TABLET    Take 1 tablet (50 mg total) by mouth daily.   LOVASTATIN (MEVACOR) 20 MG TABLET  TAKE 1 TABLET BY MOUTH NIGHTLY AT BEDTIME   MILK THISTLE 175 MG TABLET    Take 3 tablets in the evening.   MULTIPLE VITAMIN (MULTIVITAMIN) TABLET    Take 1 tablet by mouth daily.   MUPIROCIN OINTMENT (BACTROBAN) 2 %    Apply 1 Application topically at bedtime. Swab both nostrils.   OMEPRAZOLE (PRILOSEC) 20 MG CAPSULE    Take 20 mg by mouth every other day.   TOBRAMYCIN-DEXAMETHASONE (TOBRADEX) OPHTHALMIC SOLUTION    2 drops into right ear 3 times daily for 7 days   UNABLE TO FIND    Med Name: [facility]  Take one capsule three times daily with meals.  Modified Medications   Modified Medication Previous Medication   CLONAZEPAM (KLONOPIN) 0.5 MG TABLET clonazePAM (KLONOPIN) 0.5 MG tablet      Take 1 tablet (0.5 mg total) by mouth daily as needed for anxiety.    Take 1 tablet (0.5 mg total) by mouth 3 (three) times daily as needed for anxiety.  Discontinued Medications   No medications on file    Physical Exam:  There were no vitals filed for this visit. There is no height or weight on file to calculate BMI. Wt Readings from Last 3 Encounters:  01/11/24 203 lb (92.1 kg)  07/06/23 203 lb (92.1 kg)  05/17/23 203 lb 3.2 oz (92.2 kg)    Physical Exam Constitutional:      Appearance: Normal appearance.  Neurological:      Mental Status: He is alert. Mental status is at baseline.  Psychiatric:        Mood and Affect: Mood normal.    Labs reviewed: Basic Metabolic Panel: Recent Labs    07/03/23 0828 01/08/24 0809  NA 141 140  K 4.3 4.5  CL 104 100  CO2 28 29  GLUCOSE 121* 128*  BUN 15 17  CREATININE 0.98 0.93  CALCIUM 10.0 9.6   Liver Function Tests: Recent Labs    07/03/23 0828 01/08/24 0809  AST 36* 53*  ALT 54* 61*  BILITOT 0.4 0.8  PROT 7.2 7.0   No results for input(s): "LIPASE", "AMYLASE" in the last 8760 hours. No results for input(s): "AMMONIA" in the last 8760 hours. CBC: Recent Labs    05/17/23 1337 07/03/23 0828 01/08/24 0809  WBC 9.8 7.4 7.8  NEUTROABS 5,782 3,345 4,126  HGB 13.9 14.3 14.2  HCT 41.5 41.5 42.5  MCV 95.4 96.7 96.8  PLT 196 191 172   Lipid Panel: Recent Labs    07/03/23 0828 01/08/24 0809  CHOL 187 183  HDL 66 67  LDLCALC 99 97  TRIG 127 99  CHOLHDL 2.8 2.7   TSH: No results for input(s): "TSH" in the last 8760 hours. A1C: Lab Results  Component Value Date   HGBA1C 6.0 (H) 01/08/2024     Assessment/Plan There are no diagnoses linked to this encounter.  Next appt: *** Macgregor Aeschliman K. Biagio Borg  [facility] [phone]    Virtual Visit via video  I connected with patient on 01/15/24 at 10:20 AM EST by mychart and verified that I am speaking with the correct person using two identifiers.  Location: Patient: *** Provider: ***   I discussed the limitations, risks, security and privacy concerns of performing an evaluation and management service by telephone and the availability of in person appointments. I also discussed with the patient that there may be a patient responsible charge related to this service. The patient expressed  understanding and agreed to proceed.   I discussed the assessment and treatment plan with the patient. The patient was provided an opportunity to ask questions and all were  answered. The patient agreed with the plan and demonstrated an understanding of the instructions.   The patient was advised to call back or seek an in-person evaluation if the symptoms worsen or if the condition fails to improve as anticipated.  I provided *** minutes of non-face-to-face time during this encounter.  Janene Harvey. Biagio Borg [facility] printed and mailed

## 2024-01-15 NOTE — Telephone Encounter (Signed)
Mr. philippe, poston are scheduled for a virtual visit with your provider today.    Just as we do with appointments in the office, we must obtain your consent to participate.  Your consent will be active for this visit and any virtual visit you Venkat Ankney have with one of our providers in the next 365 days.    If you have a MyChart account, I can also send a copy of this consent to you electronically.  All virtual visits are billed to your insurance company just like a traditional visit in the office.  As this is a virtual visit, video technology does not allow for your provider to perform a traditional examination.  This Zakia Sainato limit your provider's ability to fully assess your condition.  If your provider identifies any concerns that need to be evaluated in person or the need to arrange testing such as labs, EKG, etc, we will make arrangements to do so.    Although advances in technology are sophisticated, we cannot ensure that it will always work on either your end or our end.  If the connection with a video visit is poor, we Kimyetta Flott have to switch to a telephone visit.  With either a video or telephone visit, we are not always able to ensure that we have a secure connection.   I need to obtain your verbal consent now.   Are you willing to proceed with your visit today?   Brennon Elder Love has provided verbal consent on 01/15/2024 for a virtual visit (video or telephone).   MayBeckey Downing, [place] 01/15/2024  10:15 AM

## 2024-01-31 ENCOUNTER — Encounter: Payer: Self-pay | Admitting: Nurse Practitioner

## 2024-02-01 DIAGNOSIS — R1032 Left lower quadrant pain: Secondary | ICD-10-CM | POA: Diagnosis not present

## 2024-02-04 ENCOUNTER — Other Ambulatory Visit: Payer: Self-pay | Admitting: *Deleted

## 2024-02-04 DIAGNOSIS — K580 Irritable bowel syndrome with diarrhea: Secondary | ICD-10-CM

## 2024-02-04 MED ORDER — DICYCLOMINE HCL 20 MG PO TABS: ORAL_TABLET | ORAL | 3 refills | Status: DC

## 2024-02-04 NOTE — Telephone Encounter (Signed)
 Pharmacy requested refill.  Pended Rx and sent to [facility] for approval.

## 2024-02-06 DIAGNOSIS — Z08 Encounter for follow-up examination after completed treatment for malignant neoplasm: Secondary | ICD-10-CM | POA: Diagnosis not present

## 2024-02-06 DIAGNOSIS — Z85828 Personal history of other malignant neoplasm of skin: Secondary | ICD-10-CM | POA: Diagnosis not present

## 2024-02-06 DIAGNOSIS — L821 Other seborrheic keratosis: Secondary | ICD-10-CM | POA: Diagnosis not present

## 2024-02-14 ENCOUNTER — Other Ambulatory Visit: Payer: Self-pay | Admitting: Nurse Practitioner

## 2024-02-14 DIAGNOSIS — F902 Attention-deficit hyperactivity disorder, combined type: Secondary | ICD-10-CM

## 2024-02-14 MED ORDER — AMPHETAMINE-DEXTROAMPHET ER 10 MG PO CP24: ORAL_CAPSULE | ORAL | 0 refills | Status: DC

## 2024-02-14 NOTE — Telephone Encounter (Signed)
 Patient is requesting a refill of the following medications: Requested Prescriptions   Pending Prescriptions Disp Refills   amphetamine-dextroamphetamine (ADDERALL XR) 10 MG 24 hr capsule 30 capsule 0    Sig: Take one tablet once a day for ADD    Date of last refill: 01/11/24  Refill amount: 30  Treatment agreement date: Outdated, notation made on April 2025 appointment to update

## 2024-03-07 ENCOUNTER — Other Ambulatory Visit: Payer: Self-pay | Admitting: Nurse Practitioner

## 2024-03-07 MED ORDER — OMEPRAZOLE 20 MG PO CPDR: 20.0000 mg | DELAYED_RELEASE_CAPSULE | ORAL | 1 refills | Status: DC

## 2024-03-07 NOTE — Telephone Encounter (Signed)
 Copied from CRM [id number]. Topic: Clinical - Medication Refill >> Mar 07, 2024 12:11 PM Everette Rank wrote: Most Recent Primary Care Visit:  Provider: Sharon Seller  Department: [facility]  Visit Type: MYCHART VIDEO VISIT  Date: 01/15/2024  Medication: omeprazole (PRILOSEC) 20 MG capsule halobetasol (ULTRAVATE) 0.05 % cream  Has the patient contacted their pharmacy? No (Agent: If no, request that the patient contact the pharmacy for the refill. If patient does not wish to contact the pharmacy document the reason why and proceed with request.) (Agent: If yes, when and what did the pharmacy advise?)  Is this the correct pharmacy for this prescription? Yes If no, delete pharmacy and type the correct one.  This is the patient's preferred pharmacy:  [facility] [address] [place], [place] - [phone] [place] [address] Phone: [phone] Fax: [phone]  [id number] - [place], [place] - [address] AT [facility] [address] [facility] [address] & [address] [place] [address] Phone: [phone] Fax: [phone]   Has the prescription been filled recently? No  Is the patient out of the medication? No  Has the patient been seen for an appointment in the last year OR does the patient have an upcoming appointment? Yes  Can we respond through MyChart? Yes  Agent: Please be advised that Rx refills may take up to 3 business days. We ask that you follow-up with your pharmacy.

## 2024-03-21 ENCOUNTER — Other Ambulatory Visit: Payer: Self-pay | Admitting: Nurse Practitioner

## 2024-03-21 DIAGNOSIS — F902 Attention-deficit hyperactivity disorder, combined type: Secondary | ICD-10-CM

## 2024-03-21 MED ORDER — AMPHETAMINE-DEXTROAMPHET ER 10 MG PO CP24: ORAL_CAPSULE | ORAL | 0 refills | Status: DC

## 2024-03-21 NOTE — Telephone Encounter (Signed)
 Patient is requesting a refill of the following medications: Requested Prescriptions   Pending Prescriptions Disp Refills   amphetamine-dextroamphetamine (ADDERALL XR) 10 MG 24 hr capsule 30 capsule 0    Sig: Take one tablet once a day for ADD    Date of last refill: 02/14/24  Refill amount: 30  Treatment agreement date: No treatment agreement on file, notation made on pending appointment for April 2025

## 2024-03-25 ENCOUNTER — Ambulatory Visit (INDEPENDENT_AMBULATORY_CARE_PROVIDER_SITE_OTHER): Payer: Medicare Other | Admitting: Nurse Practitioner

## 2024-03-25 DIAGNOSIS — F419 Anxiety disorder, unspecified: Secondary | ICD-10-CM | POA: Diagnosis not present

## 2024-03-25 DIAGNOSIS — L309 Dermatitis, unspecified: Secondary | ICD-10-CM

## 2024-03-25 DIAGNOSIS — Z0000 Encounter for general adult medical examination without abnormal findings: Secondary | ICD-10-CM | POA: Diagnosis not present

## 2024-03-25 MED ORDER — CLONAZEPAM 0.5 MG PO TABS: 0.5000 mg | ORAL_TABLET | Freq: Every day | ORAL | 5 refills | Status: DC | PRN

## 2024-03-25 MED ORDER — HALOBETASOL PROPIONATE 0.05 % EX CREA: TOPICAL_CREAM | CUTANEOUS | 1 refills | Status: DC

## 2024-03-25 NOTE — Progress Notes (Signed)
 Subjective:   Connor [name] is a 68 y.o. male who presents for Medicare Annual/Subsequent preventive examination.  Visit Complete: Virtual I connected with  Connor [name] on 03/25/24 by a video and audio enabled telemedicine application and verified that I am speaking with the correct person using two identifiers.  Patient Location: Home  Provider Location: Office/Clinic  I discussed the limitations of evaluation and management by telemedicine. The patient expressed understanding and agreed to proceed.  Vital Signs: Because this visit was a virtual/telehealth visit, some criteria may be missing or patient reported. Any vitals not documented were not able to be obtained and vitals that have been documented are patient reported.   Cardiac Risk Factors include: advanced age (>95men, >40 women);male gender;diabetes mellitus;dyslipidemia;sedentary lifestyle     Objective:    There were no vitals filed for this visit. There is no height or weight on file to calculate BMI.     03/25/2024    8:45 AM 01/15/2024   10:14 AM 01/11/2024    9:55 AM 07/06/2023   10:30 AM 05/17/2023    1:00 PM 03/22/2023    8:45 AM 01/01/2023    9:59 AM  Advanced Directives  Does Patient Have a Medical Advance Directive? Yes Yes Yes Yes Yes Yes Yes  Type of Estate agent of [place];Living will Healthcare Power of [place];Out of facility DNR (pink MOST or yellow form);Living will Healthcare Power of [place];Living will;Out of facility DNR (pink MOST or yellow form) Healthcare Power of [place];Living will Healthcare Power of [place];Living will Healthcare Power of [place];Living will Healthcare Power of [place];Living will  Does patient want to make changes to medical advance directive? No - Patient declined No - Patient declined No - Patient declined No - Patient declined No - Patient declined No - Patient declined No - Patient declined  Copy of Healthcare Power of Attorney in Chart? No -  copy requested No - copy requested No - copy requested No - copy requested No - copy requested No - copy requested No - copy requested    Current Medications (verified) Outpatient Encounter Medications as of 03/25/2024  Medication Sig   acetaminophen (TYLENOL) 500 MG tablet Take 500 mg by mouth as needed.   amphetamine-dextroamphetamine (ADDERALL XR) 10 MG 24 hr capsule Take one tablet once a day for ADD   aspirin EC 325 MG tablet Take 325 mg by mouth as needed.   cetirizine (ZYRTEC) 10 MG tablet Take 10 mg by mouth as needed.   dicyclomine (BENTYL) 20 MG tablet TAKE 1 TABLET BY MOUTH EVERY 6 HOURS AS NEEDED FOR PAIN OR IRRITABLE BOWEL SYNDROME   latanoprost (XALATAN) 0.005 % ophthalmic solution Place 1 drop into both eyes at bedtime.   losartan (COZAAR) 50 MG tablet Take 1 tablet (50 mg total) by mouth daily.   lovastatin (MEVACOR) 20 MG tablet TAKE 1 TABLET BY MOUTH NIGHTLY AT BEDTIME   Multiple Vitamin (MULTIVITAMIN) tablet Take 1 tablet by mouth daily.   omeprazole (PRILOSEC) 20 MG capsule Take 1 capsule (20 mg total) by mouth every other day.   [DISCONTINUED] clonazePAM (KLONOPIN) 0.5 MG tablet Take 1 tablet (0.5 mg total) by mouth daily as needed for anxiety.   [DISCONTINUED] halobetasol (ULTRAVATE) 0.05 % cream APPLY EXTERNALLY TO RASH UP TO THREE TIMES DAILY   clonazePAM (KLONOPIN) 0.5 MG tablet Take 1 tablet (0.5 mg total) by mouth daily as needed for anxiety.   halobetasol (ULTRAVATE) 0.05 % cream APPLY EXTERNALLY TO RASH UP TO THREE  TIMES DAILY   [DISCONTINUED] ASHWAGANDHA PO Take one by mouth daily.   [DISCONTINUED] Kudzu POWD Take one capsule by mouth daily.   [DISCONTINUED] milk thistle 175 MG tablet Take 3 tablets in the evening.   [DISCONTINUED] mupirocin ointment (BACTROBAN) 2 % Apply 1 Application topically at bedtime. Swab both nostrils.   [DISCONTINUED] tobramycin-dexamethasone (TOBRADEX) ophthalmic solution 2 drops into right ear 3 times daily for 7 days   [DISCONTINUED]  UNABLE TO FIND Med Name: [facility]  Take one capsule three times daily with meals.   No facility-administered encounter medications on file as of 03/25/2024.    Allergies (verified) Amitriptyline, Zolpidem tartrate, Amoxicillin-pot clavulanate, and Propoxyphene hcl   History: Past Medical History:  Diagnosis Date   Abdominal pain, left lower quadrant    Actinic keratosis    ADD (attention deficit disorder)    Alcohol abuse, unspecified    Anxiety    Anxiety state, unspecified    Arthritis    shoulders and hips   Atrial fibrillation (HCC)    Attention deficit disorder without mention of hyperactivity    Cervicalgia    COVID    Depressive disorder, not elsewhere classified    Eczema 11/25/2011   right hand   Edema    Encounter for long-term (current) use of other medications    Glaucoma    Right eye   Headache(784.0)    tension or sinus   High cholesterol    Inguinal hernia 11/25/2011   right   Inguinal hernia without mention of obstruction or gangrene, unilateral or unspecified, (not specified as recurrent)    Insomnia, unspecified    Internal hemorrhoids without mention of complication    Irritable bowel syndrome    Irritable bowel syndrome (IBS)    Lumbago    Major depressive disorder, recurrent episode, severe, without mention of psychotic behavior    Obesity, unspecified    Other and unspecified hyperlipidemia    Other atopic dermatitis and related conditions    Other disorders of vitreous    Other seborrheic keratosis    Other specified visual disturbances    Pain in joint, pelvic region and thigh    Pain in joint, site unspecified    Pathologic fracture of vertebrae    Poisoning and toxic reactions caused by other specified animals and plants    Recent retinal detachment, partial, with single defect    Routine general medical examination at a health care facility    Sciatica    Sinus infection 11/25/2011   started antibiotic 12/18/2011 x 7 days; current  cough   Special screening for malignant neoplasm of prostate    Tension headache    Type II or unspecified type diabetes mellitus without mention of complication, not stated as uncontrolled    Unspecified essential hypertension    Past Surgical History:  Procedure Laterality Date   APPENDECTOMY  1987   CATARACT EXTRACTION  2011   right eye   CATARACT EXTRACTION W/ INTRAOCULAR LENS IMPLANT Left 04/04/2017   Dr. Dawna Part   EYE SURGERY Right 09/13/2018   Dr.Hanes with [facility]    HEMORRHOID SURGERY  04/15/14   thrombosed int. Hemorrhoid. Dr. Violeta Gelinas   HERNIA REPAIR  05/03/2011   left   INGUINAL HERNIA REPAIR  12/28/2011   Procedure: HERNIA REPAIR INGUINAL ADULT;  Surgeon: Emelia Loron, MD;  Location: [facility];  Service: General;  Laterality: Right;   NASAL POLYP SURGERY  01/05/2011   DR. NEWMAN    NASAL  SINUS SURGERY  12/2010   removed anal warts  1976   Dr Terri Piedra    RETINAL DETACHMENT REPAIR W/ SCLERAL BUCKLE LE  07/22/2008   right eye; pars plana vitrectomy   TONSILLECTOMY AND ADENOIDECTOMY  1964   TYMPANOSTOMY TUBE PLACEMENT  June 2015   Dr. Salena Saner. [place]   Family History  Problem Relation Age of Onset   Stroke Father    Cancer Brother        prostate   Cancer Paternal Grandfather        colon   Social History   Socioeconomic History   Marital status: Married    Spouse name: Not on file   Number of children: Not on file   Years of education: Not on file   Highest education level: Bachelor's degree (e.g., BA, AB, BS)  Occupational History   Not on file  Tobacco Use   Smoking status: Former    Current packs/day: 0.00    Types: Cigarettes    Quit date: 05/29/1997    Years since quitting: 26.8   Smokeless tobacco: Never  Vaping Use   Vaping status: Never Used  Substance and Sexual Activity   Alcohol use: Not Currently    Comment: 4 or more beers/day   Drug use: No   Sexual activity: Yes    Partners: Female    Comment: Wife  Other  Topics Concern   Not on file  Social History Narrative   Not on file   Social Drivers of Health   Financial Resource Strain: Low Risk  (01/07/2024)   Overall Financial Resource Strain (CARDIA)    Difficulty of Paying Living Expenses: Not hard at all  Food Insecurity: No Food Insecurity (01/07/2024)   Hunger Vital Sign    Worried About Running Out of Food in the Last Year: Never true    Ran Out of Food in the Last Year: Never true  Transportation Needs: No Transportation Needs (01/07/2024)   PRAPARE - Administrator, Civil Service (Medical): No    Lack of Transportation (Non-Medical): No  Physical Activity: Insufficiently Active (01/07/2024)   Exercise Vital Sign    Days of Exercise per Week: 1 day    Minutes of Exercise per Session: 20 min  Stress: Stress Concern Present (01/07/2024)   [employer] of Occupational Health - Occupational Stress Questionnaire    Feeling of Stress : To some extent  Social Connections: Moderately Isolated (01/07/2024)   Social Connection and Isolation Panel [NHANES]    Frequency of Communication with Friends and Family: Once a week    Frequency of Social Gatherings with Friends and Family: Once a week    Attends Religious Services: 1 to 4 times per year    Active Member of [employer] or Organizations: No    Attends Engineer, structural: Not on file    Marital Status: Married    Tobacco Counseling Counseling given: Not Answered   Clinical Intake:  Pre-visit preparation completed: Yes  Pain : No/denies pain     BMI - recorded: 29 Nutritional Status: BMI 25 -29 Overweight Nutritional Risks: None  How often do you need to have someone help you when you read instructions, pamphlets, or other written materials from your doctor or pharmacy?: 1 - Never         Activities of Daily Living    03/25/2024    9:21 AM 03/21/2024    8:48 AM  In your present state of health, do you have any  difficulty performing the following  activities:  Hearing? 1 1  Vision? 0 0  Difficulty concentrating or making decisions? 0   Walking or climbing stairs? 0 0  Dressing or bathing? 0 0  Doing errands, shopping? 0 0  Preparing Food and eating ? N N  Using the Toilet? N N  In the past six months, have you accidently leaked urine? N N  Do you have problems with loss of bowel control? N N  Managing your Medications? N N  Managing your Finances? N N  Housekeeping or managing your Housekeeping? N N    Patient Care Team: Sharon Seller, NP as PCP - General (Geriatric Medicine) Drema Halon, MD (Inactive) as Consulting Physician (Otolaryngology) Violeta Gelinas, MD as Consulting Physician (General Surgery) Meryl Dare, MD (Inactive) as Consulting Physician (Gastroenterology) Emelia Loron, MD as Consulting Physician (General Surgery) Nita Sells, MD (Dermatology) Webb Silversmith, MD (Gastroenterology)  Indicate any recent Medical Services you may have received from other than [facility] providers in the past year (date may be approximate).     Assessment:   This is a routine wellness examination for Connor [name].  Hearing/Vision screen Hearing Screening - Comments:: Hearing loss, does not wear hearing aids  Vision Screening - Comments:: Last eye exam less than 12 months ago @ [facility]    Goals Addressed   None    Depression Screen    03/25/2024    8:43 AM 03/22/2023    8:41 AM 01/01/2023    9:58 AM 12/06/2020    9:42 AM 12/05/2019    9:34 AM 11/29/2018    8:23 AM 11/29/2017    8:35 AM  PHQ 2/9 Scores  PHQ - 2 Score 0 0 0 0 0 0 0    Fall Risk    03/25/2024    8:42 AM 03/21/2024    8:48 AM 01/11/2024    9:54 AM 03/22/2023    8:41 AM 01/01/2023    9:58 AM  Fall Risk   Falls in the past year? 1  0 0 0  Comment Fell of bridge, hiking      Number falls in past yr: 0 0 0 0 0  Injury with Fall? 1 1 0 0 0  Comment sore knee, hip, and ankle      Risk for fall due to : History of fall(s)  No Fall Risks  No Fall Risks No Fall Risks  Follow up Falls evaluation completed  Falls evaluation completed;Education provided;Falls prevention discussed Falls evaluation completed     MEDICARE RISK AT HOME: Medicare Risk at Home Any stairs in or around the home?: Yes If so, are there any without handrails?: No Home free of loose throw rugs in walkways, pet beds, electrical cords, etc?: Yes Adequate lighting in your home to reduce risk of falls?: Yes Life alert?: No Use of a cane, walker or w/c?: No Grab bars in the bathroom?: No Shower chair or bench in shower?: Yes Elevated toilet seat or a handicapped toilet?: Yes  TIMED UP AND GO:  Was the test performed?  No    Cognitive Function:        03/25/2024    8:45 AM 03/22/2023    8:45 AM  6CIT Screen  What Year? 0 points 0 points  What month? 0 points 0 points  What time? 0 points 0 points  Count back from 20 0 points 0 points  Months in reverse 0 points 0 points  Repeat phrase 0 points 2  points  Total Score 0 points 2 points    Immunizations Immunization History  Administered Date(s) Administered   DTaP 05/31/2006   Fluad Quad(high Dose 65+) 09/20/2022   Fluad Trivalent(High Dose 65+) 10/10/2023   Influenza,inj,Quad PF,6+ Mos 09/09/2019, 09/21/2020, 10/28/2021   Influenza-Unspecified 10/06/2014, 09/28/2015, 09/25/2016, 09/25/2017, 09/25/2018   PFIZER(Purple Top)SARS-COV-2 Vaccination 03/18/2020, 04/12/2020, 10/21/2020   PNEUMOCOCCAL CONJUGATE-20 07/06/2023   Pfizer Covid-19 Vaccine Bivalent Booster 39yrs & up 09/13/2021   Tdap 10/25/2013   Zoster Recombinant(Shingrix) 10/20/2019, 12/29/2019    TDAP status: DUE to get at local pharmacy   Flu Vaccine status: Up to date  Pneumococcal vaccine status: Up to date  Covid-19 vaccine status: Information provided on how to obtain vaccines.   Qualifies for Shingles Vaccine? Yes   Zostavax completed No   Shingrix Completed?: Yes  Screening Tests Health Maintenance  Topic Date Due    COVID-19 Vaccine (5 - 2024-25 season) 08/26/2023   DTaP/Tdap/Td (3 - Td or Tdap) 10/26/2023   INFLUENZA VACCINE  07/25/2024   Medicare Annual Wellness (AWV)  03/25/2025   Colonoscopy  02/07/2028   Pneumonia Vaccine 71+ Years old  Completed   Hepatitis C Screening  Completed   Zoster Vaccines- Shingrix  Completed   HPV VACCINES  Aged Out    Health Maintenance  Health Maintenance Due  Topic Date Due   COVID-19 Vaccine (5 - 2024-25 season) 08/26/2023   DTaP/Tdap/Td (3 - Td or Tdap) 10/26/2023    Colorectal cancer screening: Type of screening: Colonoscopy. Completed 2019. Repeat every 10 years  Lung Cancer Screening: (Low Dose CT Chest recommended if Age 46-80 years, 20 pack-year currently smoking OR have quit w/in 15years.) does not qualify.   Lung Cancer Screening Referral: nan  Additional Screening:  Hepatitis C Screening: does  qualify; Completed   Vision Screening: Recommended annual ophthalmology exams for early detection of glaucoma and other disorders of the eye. Is the patient up to date with their annual eye exam?  Yes  Who is the provider or what is the name of the office in which the patient attends annual eye exams? [facility] If pt is not established with a provider, would they like to be referred to a provider to establish care? No .   Dental Screening: Recommended annual dental exams for proper oral hygiene   Community Resource Referral / Chronic Care Management: CRR required this visit?  No   CCM required this visit?  No     Plan:     I have personally reviewed and noted the following in the patient's chart:   Medical and social history Use of alcohol, tobacco or illicit drugs  Current medications and supplements including opioid prescriptions. Patient is not currently taking opioid prescriptions. Functional ability and status Nutritional status Physical activity Advanced directives List of other physicians Hospitalizations, surgeries, and  ER visits in previous 12 months Vitals Screenings to include cognitive, depression, and falls Referrals and appointments  In addition, I have reviewed and discussed with patient certain preventive protocols, quality metrics, and best practice recommendations. A written personalized care plan for preventive services as well as general preventive health recommendations were provided to patient.     Sharon Seller, NP   03/25/2024   After Visit Summary: (MyChart) Due to this being a telephonic visit, the after visit summary with patients personalized plan was offered to patient via MyChart

## 2024-03-25 NOTE — Patient Instructions (Signed)
  Mr. Sisemore , Thank you for taking time to come for your Medicare Wellness Visit. I appreciate your ongoing commitment to your health goals. Please review the following plan we discussed and let me know if I can assist you in the future.   TDAP at local pharmacy    This is a list of the screening recommended for you and due dates:  Health Maintenance  Topic Date Due   COVID-19 Vaccine (5 - 2024-25 season) 08/26/2023   DTaP/Tdap/Td vaccine (3 - Td or Tdap) 10/26/2023   Flu Shot  07/25/2024   Medicare Annual Wellness Visit  03/25/2025   Colon Cancer Screening  02/07/2028   Pneumonia Vaccine  Completed   Hepatitis C Screening  Completed   Zoster (Shingles) Vaccine  Completed   HPV Vaccine  Aged Out

## 2024-03-25 NOTE — Progress Notes (Signed)
   This service is provided via telemedicine  No vital signs collected/recorded due to the encounter was a telemedicine visit.   Location of patient (ex: home, work):  Home  Patient consents to a telephone visit: Yes  Location of the provider (ex: office, home):  [employer], [place] Location   Name of any referring provider:  N/A  Names of all persons participating in the telemedicine service and their role in the encounter:  S.Chrae B/CMA, [name], NP, and Patient   Time spent on call:  8 min with medical assistant

## 2024-04-15 ENCOUNTER — Other Ambulatory Visit: Payer: Self-pay | Admitting: Nurse Practitioner

## 2024-04-15 ENCOUNTER — Other Ambulatory Visit: Payer: Medicare Other

## 2024-04-15 DIAGNOSIS — R739 Hyperglycemia, unspecified: Secondary | ICD-10-CM

## 2024-04-15 DIAGNOSIS — I10 Essential (primary) hypertension: Secondary | ICD-10-CM

## 2024-04-15 DIAGNOSIS — E782 Mixed hyperlipidemia: Secondary | ICD-10-CM | POA: Diagnosis not present

## 2024-04-15 NOTE — Addendum Note (Signed)
 Addended by: Chales [place] on: 04/15/2024 08:54 AM   Modules accepted: Orders

## 2024-04-15 NOTE — Progress Notes (Signed)
 Lab tech states that order isnt visible in [facility] system. I placed order back in and printed off.

## 2024-04-16 ENCOUNTER — Encounter: Payer: Self-pay | Admitting: Nurse Practitioner

## 2024-04-16 LAB — COMPREHENSIVE METABOLIC PANEL WITH GFR
AG Ratio: 2 (calc) (ref 1.0–2.5)
ALT: 20 U/L (ref 9–46)
AST: 23 U/L (ref 10–35)
Albumin: 4.7 g/dL (ref 3.6–5.1)
Alkaline phosphatase (APISO): 60 U/L (ref 35–144)
BUN: 16 mg/dL (ref 7–25)
CO2: 27 mmol/L (ref 20–32)
Calcium: 9.6 mg/dL (ref 8.6–10.3)
Chloride: 101 mmol/L (ref 98–110)
Creat: 0.88 mg/dL (ref 0.70–1.35)
Globulin: 2.3 g/dL (ref 1.9–3.7)
Glucose, Bld: 98 mg/dL (ref 65–99)
Potassium: 4.2 mmol/L (ref 3.5–5.3)
Sodium: 137 mmol/L (ref 135–146)
Total Bilirubin: 0.8 mg/dL (ref 0.2–1.2)
Total Protein: 7 g/dL (ref 6.1–8.1)
eGFR: 95 mL/min/{1.73_m2} (ref >=60)

## 2024-04-18 ENCOUNTER — Ambulatory Visit: Payer: Medicare Other | Admitting: Nurse Practitioner

## 2024-04-18 ENCOUNTER — Encounter: Payer: Self-pay | Admitting: Nurse Practitioner

## 2024-04-18 VITALS — BP 116/78 | HR 77 | Temp 97.3°F | Resp 18 | Ht 70.0 in | Wt 197.8 lb

## 2024-04-18 DIAGNOSIS — K580 Irritable bowel syndrome with diarrhea: Secondary | ICD-10-CM | POA: Diagnosis not present

## 2024-04-18 DIAGNOSIS — K219 Gastro-esophageal reflux disease without esophagitis: Secondary | ICD-10-CM | POA: Diagnosis not present

## 2024-04-18 DIAGNOSIS — F902 Attention-deficit hyperactivity disorder, combined type: Secondary | ICD-10-CM | POA: Diagnosis not present

## 2024-04-18 DIAGNOSIS — J309 Allergic rhinitis, unspecified: Secondary | ICD-10-CM

## 2024-04-18 DIAGNOSIS — E785 Hyperlipidemia, unspecified: Secondary | ICD-10-CM | POA: Diagnosis not present

## 2024-04-18 DIAGNOSIS — K5730 Diverticulosis of large intestine without perforation or abscess without bleeding: Secondary | ICD-10-CM

## 2024-04-18 DIAGNOSIS — R739 Hyperglycemia, unspecified: Secondary | ICD-10-CM

## 2024-04-18 DIAGNOSIS — I10 Essential (primary) hypertension: Secondary | ICD-10-CM | POA: Diagnosis not present

## 2024-04-18 DIAGNOSIS — F4322 Adjustment disorder with anxiety: Secondary | ICD-10-CM

## 2024-04-18 DIAGNOSIS — F1011 Alcohol abuse, in remission: Secondary | ICD-10-CM

## 2024-04-18 MED ORDER — AMPHETAMINE-DEXTROAMPHET ER 10 MG PO CP24: ORAL_CAPSULE | ORAL | 0 refills | Status: DC

## 2024-04-18 NOTE — Assessment & Plan Note (Signed)
 Controlled on current regimen.

## 2024-04-18 NOTE — Assessment & Plan Note (Signed)
 Following A1c, continue dietary modifications

## 2024-04-18 NOTE — Assessment & Plan Note (Signed)
 LDL stable on last labs, continues on lovastatin  at bedtime

## 2024-04-18 NOTE — Progress Notes (Signed)
 Careteam: Patient Care Team: Verma Gobble, NP as PCP - General (Geriatric Medicine) Prescott Brodie, MD (Inactive) as Consulting Physician (Otolaryngology) Dorena Gander, MD as Consulting Physician (General Surgery) Asencion Blacksmith, MD (Inactive) as Consulting Physician (Gastroenterology) Enid Harry, MD as Consulting Physician (General Surgery) Denman Fischer, MD (Dermatology) Jerlene Moody, MD (Gastroenterology)  PLACE OF SERVICE:  [facility]  Advanced Directive information    Allergies  Allergen Reactions   Amitriptyline    Zolpidem Tartrate Other (See Comments)    Severe headache   Amoxicillin-Pot Clavulanate Rash   Propoxyphene Hcl Nausea Only    Chief Complaint  Patient presents with   Medical Management of Chronic Issues    3 months follow-up    HPI:  Discussed the use of AI scribe software for clinical note transcription with the patient, who gave verbal consent to proceed.  History of Present Illness   Connor [name] is a 68 year old male who presents for a three-month follow-up visit.  He has been monitoring his alcohol intake, consuming a small bottle of liquor over two and a half days last week, followed by three alcohol-free days. He then consumed one beer on Saturday and Sunday, and two beers on Monday. He is attempting to spread out his alcohol consumption and take days off. His liver enzymes have improved and are now within normal range.  He experiences anxiety and uses medication as needed, approximately three days on and three days off. He also uses Adderall intermittently, not daily, but as needed for focus, with about four pills remaining from last month's prescription.  He describes an episode of severe abdominal pain occurring last Sunday at 3 AM, which later became soreness in the lower abdomen. The pain resolved after a day. He also experienced a sensation of 'something sticking' in the same area after the pain subsided, which  he associates with gas or gallbladder issues.  He reports some fluid in his ear, occasional pain, and itching, which he attributes to allergies. He has tried Zyrtec and Xyzal for relief.  He has been physically active, moving boxes for a house move and walking [place] the zoo, which resulted in Achilles tendon pain that he managed with ice.    Review of Systems:  Review of Systems  Constitutional:  Negative for chills, fever and weight loss.  HENT:  Negative for tinnitus.   Respiratory:  Negative for cough, sputum production and shortness of breath.   Cardiovascular:  Negative for chest pain, palpitations and leg swelling.  Gastrointestinal:  Negative for abdominal pain, constipation, diarrhea and heartburn.  Genitourinary:  Negative for dysuria, frequency and urgency.  Musculoskeletal:  Negative for back pain, falls, joint pain and myalgias.  Skin: Negative.   Neurological:  Negative for dizziness and headaches.  Psychiatric/Behavioral:  Negative for depression and memory loss. The patient does not have insomnia.     Past Medical History:  Diagnosis Date   Abdominal pain, left lower quadrant    Actinic keratosis    ADD (attention deficit disorder)    Alcohol abuse, unspecified    Anxiety    Anxiety state, unspecified    Arthritis    shoulders and hips   Atrial fibrillation (HCC)    Attention deficit disorder without mention of hyperactivity    Cervicalgia    COVID    Depressive disorder, not elsewhere classified    Eczema 11/25/2011   right hand   Edema    Encounter for long-term (current) use of  other medications    Glaucoma    Right eye   Headache(784.0)    tension or sinus   High cholesterol    Inguinal hernia 11/25/2011   right   Inguinal hernia without mention of obstruction or gangrene, unilateral or unspecified, (not specified as recurrent)    Insomnia, unspecified    Internal hemorrhoids without mention of complication    Irritable bowel syndrome    Irritable  bowel syndrome (IBS)    Lumbago    Major depressive disorder, recurrent episode, severe, without mention of psychotic behavior    Obesity, unspecified    Other and unspecified hyperlipidemia    Other atopic dermatitis and related conditions    Other disorders of vitreous    Other seborrheic keratosis    Other specified visual disturbances    Pain in joint, pelvic region and thigh    Pain in joint, site unspecified    Pathologic fracture of vertebrae    Poisoning and toxic reactions caused by other specified animals and plants    Recent retinal detachment, partial, with single defect    Routine general medical examination at a health care facility    Sciatica    Sinus infection 11/25/2011   started antibiotic 12/18/2011 x 7 days; current cough   Special screening for malignant neoplasm of prostate    Tension headache    Type II or unspecified type diabetes mellitus without mention of complication, not stated as uncontrolled    Unspecified essential hypertension    Past Surgical History:  Procedure Laterality Date   APPENDECTOMY  1987   CATARACT EXTRACTION  2011   right eye   CATARACT EXTRACTION W/ INTRAOCULAR LENS IMPLANT Left 04/04/2017   Dr. Celina Colla   EYE SURGERY Right 09/13/2018   Dr.Hanes with [facility]    HEMORRHOID SURGERY  04/15/14   thrombosed int. Hemorrhoid. Dr. Dorena Gander   HERNIA REPAIR  05/03/2011   left   INGUINAL HERNIA REPAIR  12/28/2011   Procedure: HERNIA REPAIR INGUINAL ADULT;  Surgeon: Enid Harry, MD;  Location: [facility];  Service: General;  Laterality: Right;   NASAL POLYP SURGERY  01/05/2011   DR. [facility]    NASAL SINUS SURGERY  12/2010   removed anal warts  1976   Dr Fleurette Humbles    RETINAL DETACHMENT REPAIR W/ SCLERAL BUCKLE LE  07/22/2008   right eye; pars plana vitrectomy   TONSILLECTOMY AND ADENOIDECTOMY  1964   TYMPANOSTOMY TUBE PLACEMENT  June 2015   Dr. Tita Form. [place]   Social History:   reports that he quit smoking about  26 years ago. His smoking use included cigarettes. He has never used smokeless tobacco. He reports that he does not currently use alcohol. He reports that he does not use drugs.  Family History  Problem Relation Age of Onset   Stroke Father    Cancer Brother        prostate   Cancer Paternal Grandfather        colon    Medications: Patient's Medications  New Prescriptions   AMPHETAMINE -DEXTROAMPHETAMINE (ADDERALL XR) 10 MG 24 HR CAPSULE    Take one tablet once a day for ADD   AMPHETAMINE -DEXTROAMPHETAMINE (ADDERALL XR) 10 MG 24 HR CAPSULE    Take one tablet once a day for ADD  Previous Medications   ACETAMINOPHEN  (TYLENOL ) 500 MG TABLET    Take 500 mg by mouth as needed.   ASPIRIN EC 325 MG TABLET    Take 325 mg by mouth  as needed.   CETIRIZINE (ZYRTEC) 10 MG TABLET    Take 10 mg by mouth as needed.   CLONAZEPAM  (KLONOPIN ) 0.5 MG TABLET    Take 1 tablet (0.5 mg total) by mouth daily as needed for anxiety.   DICYCLOMINE  (BENTYL ) 20 MG TABLET    TAKE 1 TABLET BY MOUTH EVERY 6 HOURS AS NEEDED FOR PAIN OR IRRITABLE BOWEL SYNDROME   HALOBETASOL  (ULTRAVATE ) 0.05 % CREAM    APPLY EXTERNALLY TO RASH UP TO THREE TIMES DAILY   LATANOPROST (XALATAN) 0.005 % OPHTHALMIC SOLUTION    Place 1 drop into both eyes at bedtime.   LOSARTAN  (COZAAR ) 50 MG TABLET    Take 1 tablet (50 mg total) by mouth daily.   LOVASTATIN  (MEVACOR ) 20 MG TABLET    TAKE 1 TABLET BY MOUTH NIGHTLY AT BEDTIME   MULTIPLE VITAMIN (MULTIVITAMIN) TABLET    Take 1 tablet by mouth daily.   OMEPRAZOLE  (PRILOSEC) 20 MG CAPSULE    Take 1 capsule (20 mg total) by mouth every other day.  Modified Medications   Modified Medication Previous Medication   AMPHETAMINE -DEXTROAMPHETAMINE (ADDERALL XR) 10 MG 24 HR CAPSULE amphetamine -dextroamphetamine (ADDERALL XR) 10 MG 24 hr capsule      Take one tablet once a day for ADD    Take one tablet once a day for ADD  Discontinued Medications   No medications on file    Physical Exam:  Vitals:    04/18/24 0845  BP: 116/78  Pulse: 77  Resp: 18  Temp: (!) 97.3 F (36.3 C)  SpO2: 98%  Weight: 197 lb 12.8 oz (89.7 kg)  Height: 5\' 10"  (1.778 m)   Body mass index is 28.38 kg/m. Wt Readings from Last 3 Encounters:  04/18/24 197 lb 12.8 oz (89.7 kg)  01/11/24 203 lb (92.1 kg)  07/06/23 203 lb (92.1 kg)    Physical Exam Constitutional:      General: He is not in acute distress.    Appearance: He is well-developed. He is not diaphoretic.  HENT:     Head: Normocephalic and atraumatic.     Right Ear: External ear normal.     Left Ear: External ear normal.     Mouth/Throat:     Pharynx: No oropharyngeal exudate.  Eyes:     Conjunctiva/sclera: Conjunctivae normal.     Pupils: Pupils are equal, round, and reactive to light.  Cardiovascular:     Rate and Rhythm: Normal rate and regular rhythm.     Heart sounds: Normal heart sounds.  Pulmonary:     Effort: Pulmonary effort is normal.     Breath sounds: Normal breath sounds.  Abdominal:     General: Bowel sounds are normal.     Palpations: Abdomen is soft.  Musculoskeletal:        General: No tenderness.     Cervical back: Normal range of motion and neck supple.     Right lower leg: No edema.     Left lower leg: No edema.  Skin:    General: Skin is warm and dry.  Neurological:     Mental Status: He is alert and oriented to person, place, and time.     Labs reviewed: Basic Metabolic Panel: Recent Labs    07/03/23 0828 01/08/24 0809 04/15/24 0856  NA 141 140 137  K 4.3 4.5 4.2  CL 104 100 101  CO2 28 29 27   GLUCOSE 121* 128* 98  BUN 15 17 16   CREATININE 0.98 0.93 0.88  CALCIUM 10.0 9.6 9.6   Liver Function Tests: Recent Labs    07/03/23 0828 01/08/24 0809 04/15/24 0856  AST 36* 53* 23  ALT 54* 61* 20  BILITOT 0.4 0.8 0.8  PROT 7.2 7.0 7.0   No results for input(s): "LIPASE", "AMYLASE" in the last 8760 hours. No results for input(s): "AMMONIA" in the last 8760 hours. CBC: Recent Labs     05/17/23 1337 07/03/23 0828 01/08/24 0809  WBC 9.8 7.4 7.8  NEUTROABS 5,782 3,345 4,126  HGB 13.9 14.3 14.2  HCT 41.5 41.5 42.5  MCV 95.4 96.7 96.8  PLT 196 191 172   Lipid Panel: Recent Labs    07/03/23 0828 01/08/24 0809  CHOL 187 183  HDL 66 67  LDLCALC 99 97  TRIG 127 99  CHOLHDL 2.8 2.7   TSH: No results for input(s): "TSH" in the last 8760 hours. A1C: Lab Results  Component Value Date   HGBA1C 6.0 (H) 01/08/2024     Assessment/Plan Benign essential HTN  Hyperglycemia Assessment & Plan: Following A1c, continue dietary modifications   Attention deficit hyperactivity disorder (ADHD), combined type Assessment & Plan: Controlled on current regimen.   Orders: -     Amphetamine -Dextroamphet ER; Take one tablet once a day for ADD  Dispense: 30 capsule; Refill: 0 -     Amphetamine -Dextroamphet ER; Take one tablet once a day for ADD  Dispense: 30 capsule; Refill: 0 -     Amphetamine -Dextroamphet ER; Take one tablet once a day for ADD  Dispense: 30 capsule; Refill: 0  Irritable bowel syndrome with diarrhea Assessment & Plan: Stable at this time, continues on bentyl  PRN   Diverticulosis of colon without hemorrhage Assessment & Plan: Stable at this time, continue dietary modifications    Alcohol abuse, in remission Assessment & Plan: Has restarted drinking alcohol occasionally, encouraged minimal use and may need total cessation if amount increases.    Adjustment disorder with anxiety Assessment & Plan: Overall stable, continues on clonazepam  as needed.    Hyperlipidemia, unspecified hyperlipidemia type Assessment & Plan: LDL stable on last labs, continues on lovastatin  at bedtime    Gastroesophageal reflux disease without esophagitis Assessment & Plan: Continues on omeprazole .     Allergic rhinitis, unspecified seasonality, unspecified trigger Assessment & Plan: Ongoing, continue antihistamine daily as needed      Return in about 6  months (around 10/18/2024) for routine follow up, labs prior to visit.  Javione Gunawan K. Denney Fisherman [facility] [phone]

## 2024-04-18 NOTE — Assessment & Plan Note (Signed)
 Stable at this time, continue dietary modifications

## 2024-04-18 NOTE — Assessment & Plan Note (Signed)
Continues on omeprazole.   

## 2024-04-18 NOTE — Assessment & Plan Note (Signed)
 Has restarted drinking alcohol occasionally, encouraged minimal use and may need total cessation if amount increases.

## 2024-04-18 NOTE — Assessment & Plan Note (Signed)
 Ongoing, continue antihistamine daily as needed

## 2024-04-18 NOTE — Assessment & Plan Note (Signed)
 Stable at this time, continues on bentyl  PRN

## 2024-04-18 NOTE — Assessment & Plan Note (Signed)
 Overall stable, continues on clonazepam  as needed.

## 2024-04-24 ENCOUNTER — Encounter (HOSPITAL_BASED_OUTPATIENT_CLINIC_OR_DEPARTMENT_OTHER): Payer: Self-pay

## 2024-04-24 ENCOUNTER — Ambulatory Visit (HOSPITAL_BASED_OUTPATIENT_CLINIC_OR_DEPARTMENT_OTHER)
Admission: EM | Admit: 2024-04-24 | Discharge: 2024-04-24 | Disposition: A | Discharge status: Home / Self Care | Attending: Family Medicine | Admitting: Family Medicine

## 2024-04-24 DIAGNOSIS — J0101 Acute recurrent maxillary sinusitis: Secondary | ICD-10-CM

## 2024-04-24 DIAGNOSIS — R051 Acute cough: Secondary | ICD-10-CM

## 2024-04-24 MED ORDER — CEFDINIR 300 MG PO CAPS: 300.0000 mg | ORAL_CAPSULE | Freq: Two times a day (BID) | ORAL | 0 refills | Status: AC

## 2024-04-24 NOTE — ED Triage Notes (Signed)
 Onset of sinus congestion on Saturday. Now coughing. Using sinus rinses and states those are burning his nose now.

## 2024-04-24 NOTE — Discharge Instructions (Signed)
 Treating you for sinus infection. Can continue OTC medications as needed.  Follow up as needed.

## 2024-04-24 NOTE — ED Provider Notes (Signed)
 [name]    CSN: [id number] Arrival date & time: 04/24/24  0830      History   Chief Complaint Chief Complaint  Patient presents with   Cough   sinus drainage    HPI Connor [name] is a 68 y.o. male.   Patient is a 68 year old male who presents today with cough, sinus congestion, sinus drainage.  This has been present and worsening over the past week.  He has been doing allergy medication, Robitussin, nasal rinses with no relief.  He has history of sinus infections and has had sinus surgery in the past.  He has had some facial pressure associated.  No fevers or chills.   Cough   Past Medical History:  Diagnosis Date   Abdominal pain, left lower quadrant    Actinic keratosis    ADD (attention deficit disorder)    Alcohol abuse, unspecified    Anxiety    Anxiety state, unspecified    Arthritis    shoulders and hips   Atrial fibrillation (HCC)    Attention deficit disorder without mention of hyperactivity    Cervicalgia    COVID    Depressive disorder, not elsewhere classified    Eczema 11/25/2011   right hand   Edema    Encounter for long-term (current) use of other medications    Glaucoma    Right eye   Headache(784.0)    tension or sinus   High cholesterol    Inguinal hernia 11/25/2011   right   Inguinal hernia without mention of obstruction or gangrene, unilateral or unspecified, (not specified as recurrent)    Insomnia, unspecified    Internal hemorrhoids without mention of complication    Irritable bowel syndrome    Irritable bowel syndrome (IBS)    Lumbago    Major depressive disorder, recurrent episode, severe, without mention of psychotic behavior    Obesity, unspecified    Other and unspecified hyperlipidemia    Other atopic dermatitis and related conditions    Other disorders of vitreous    Other seborrheic keratosis    Other specified visual disturbances    Pain in joint, pelvic region and thigh    Pain in joint, site unspecified     Pathologic fracture of vertebrae    Poisoning and toxic reactions caused by other specified animals and plants    Recent retinal detachment, partial, with single defect    Routine general medical examination at a health care facility    Sciatica    Sinus infection 11/25/2011   started antibiotic 12/18/2011 x 7 days; current cough   Special screening for malignant neoplasm of prostate    Tension headache    Type II or unspecified type diabetes mellitus without mention of complication, not stated as uncontrolled    Unspecified essential hypertension     Patient Active Problem List   Diagnosis Date Noted   Alcohol abuse, in remission 04/18/2024   Gastroesophageal reflux disease without esophagitis 04/18/2024   Dupuytren's contracture of both hands 05/30/2018   Biceps tendonitis 05/30/2017   Eczema 11/09/2015   Hyperglycemia 05/12/2015   Diverticulosis of colon without hemorrhage 12/14/2014   Adjustment disorder with anxiety 07/13/2014   Adjustment insomnia 07/13/2014   Allergic rhinitis 07/13/2014   Chronic tension-type headache, intractable 07/13/2014   Thrombosed external hemorrhoid 04/15/2014   Tympanic membrane disorder 10/29/2013   Hyperlipidemia 05/03/2013   ADD (attention deficit disorder)    Irritable bowel syndrome (IBS)    Lumbago  Past Surgical History:  Procedure Laterality Date   APPENDECTOMY  1987   CATARACT EXTRACTION  2011   right eye   CATARACT EXTRACTION W/ INTRAOCULAR LENS IMPLANT Left 04/04/2017   Dr. Celina Colla   EYE SURGERY Right 09/13/2018   Dr.Hanes with [facility]    HEMORRHOID SURGERY  04/15/14   thrombosed int. Hemorrhoid. Dr. Dorena Gander   HERNIA REPAIR  05/03/2011   left   INGUINAL HERNIA REPAIR  12/28/2011   Procedure: HERNIA REPAIR INGUINAL ADULT;  Surgeon: Enid Harry, MD;  Location: [facility];  Service: General;  Laterality: Right;   NASAL POLYP SURGERY  01/05/2011   DR. [facility]    NASAL SINUS SURGERY  12/2010    removed anal warts  1976   Dr Fleurette Humbles    RETINAL DETACHMENT REPAIR W/ SCLERAL BUCKLE LE  07/22/2008   right eye; pars plana vitrectomy   TONSILLECTOMY AND ADENOIDECTOMY  1964   TYMPANOSTOMY TUBE PLACEMENT  June 2015   Dr. Tita Form. [place]       Home Medications    Prior to Admission medications   Medication Sig Start Date End Date Taking? Authorizing Provider  cefdinir  (OMNICEF ) 300 MG capsule Take 1 capsule (300 mg total) by mouth 2 (two) times daily for 7 days. 04/24/24 05/01/24 Yes Tanish Prien A, FNP  acetaminophen  (TYLENOL ) 500 MG tablet Take 500 mg by mouth as needed.    [provider]  amphetamine -dextroamphetamine (ADDERALL XR) 10 MG 24 hr capsule Take one tablet once a day for ADD 06/21/24   Eubanks, Jessica K, NP  amphetamine -dextroamphetamine (ADDERALL XR) 10 MG 24 hr capsule Take one tablet once a day for ADD 05/21/24   Eubanks, Jessica K, NP  amphetamine -dextroamphetamine (ADDERALL XR) 10 MG 24 hr capsule Take one tablet once a day for ADD 04/21/24   Eubanks, Jessica K, NP  aspirin EC 325 MG tablet Take 325 mg by mouth as needed.    [provider]  cetirizine (ZYRTEC) 10 MG tablet Take 10 mg by mouth as needed.    [provider]  clonazePAM  (KLONOPIN ) 0.5 MG tablet Take 1 tablet (0.5 mg total) by mouth daily as needed for anxiety. 03/25/24   Verma Gobble, NP  dicyclomine  (BENTYL ) 20 MG tablet TAKE 1 TABLET BY MOUTH EVERY 6 HOURS AS NEEDED FOR PAIN OR IRRITABLE BOWEL SYNDROME 02/04/24   Verma Gobble, NP  halobetasol  (ULTRAVATE ) 0.05 % cream APPLY EXTERNALLY TO RASH UP TO THREE TIMES DAILY 03/25/24   Eubanks, Jessica K, NP  latanoprost (XALATAN) 0.005 % ophthalmic solution Place 1 drop into both eyes at bedtime. 12/07/22   [provider]  losartan  (COZAAR ) 50 MG tablet Take 1 tablet (50 mg total) by mouth daily. 10/11/23   Verma Gobble, NP  lovastatin  (MEVACOR ) 20 MG tablet TAKE 1 TABLET BY MOUTH NIGHTLY AT BEDTIME 10/11/23   Eubanks,  Jessica K, NP  Multiple Vitamin (MULTIVITAMIN) tablet Take 1 tablet by mouth daily.    [provider]  omeprazole  (PRILOSEC) 20 MG capsule Take 1 capsule (20 mg total) by mouth every other day. 03/07/24   Verma Gobble, NP    Family History Family History  Problem Relation Age of Onset   Stroke Father    Cancer Brother        prostate   Cancer Paternal Grandfather        colon    Social History Social History   Tobacco Use   Smoking status: Former  Current packs/day: 0.00    Types: Cigarettes    Quit date: 05/29/1997    Years since quitting: 26.9   Smokeless tobacco: Never  Vaping Use   Vaping status: Never Used  Substance Use Topics   Alcohol use: Not Currently    Comment: 4 or more beers/day   Drug use: No     Allergies   Amitriptyline, Zolpidem tartrate, Amoxicillin-pot clavulanate, and Propoxyphene hcl   Review of Systems Review of Systems  Respiratory:  Positive for cough.    See HPI  Physical Exam Triage Vital Signs ED Triage Vitals  Encounter Vitals Group     BP 04/24/24 0838 (!) 157/93     Systolic BP Percentile --      Diastolic BP Percentile --      Pulse Rate 04/24/24 0838 81     Resp 04/24/24 0838 20     Temp 04/24/24 0838 98.7 F (37.1 C)     Temp Source 04/24/24 0838 Oral     SpO2 04/24/24 0838 98 %     Weight --      Height --      Head Circumference --      Peak Flow --      Pain Score 04/24/24 0840 3     Pain Loc --      Pain Education --      Exclude from Growth Chart --    No data found.  Updated Vital Signs BP (!) 157/93 (BP Location: Right Arm)   Pulse 81   Temp 98.7 F (37.1 C) (Oral)   Resp 20   SpO2 98%   Visual Acuity Right Eye Distance:   Left Eye Distance:   Bilateral Distance:    Right Eye Near:   Left Eye Near:    Bilateral Near:     Physical Exam   UC Treatments / Results  Labs (all labs ordered are listed, but only abnormal results are displayed) Labs Reviewed - No data to  display  EKG   Radiology No results found.  Procedures Procedures (including critical care time)  Medications Ordered in UC Medications - No data to display  Initial Impression / Assessment and Plan / UC Course  I have reviewed the triage vital signs and the nursing notes.  Pertinent labs & imaging results that were available during my care of the patient were reviewed by me and considered in my medical decision making (see chart for details).     Acute recurrent sinusitis-treating with cefdinir .  Recommend continue over-the-counter medicines as needed for symptoms.  Follow-up as needed Final Clinical Impressions(s) / UC Diagnoses   Final diagnoses:  Acute recurrent maxillary sinusitis  Acute cough     Discharge Instructions      Treating you for sinus infection. Can continue OTC medications as needed.  Follow up as needed.   ED Prescriptions     Medication Sig Dispense Auth. Provider   cefdinir  (OMNICEF ) 300 MG capsule Take 1 capsule (300 mg total) by mouth 2 (two) times daily for 7 days. 14 capsule Jerri Morale A, FNP      PDMP not reviewed this encounter.   Landa Pine, FNP 04/24/24 [phone]

## 2024-05-18 ENCOUNTER — Other Ambulatory Visit: Payer: Self-pay | Admitting: Nurse Practitioner

## 2024-05-18 DIAGNOSIS — L309 Dermatitis, unspecified: Secondary | ICD-10-CM

## 2024-05-20 NOTE — Telephone Encounter (Signed)
 Is this medication meant for long term use? If so, then can we give a year supply?

## 2024-05-22 DIAGNOSIS — H40021 Open angle with borderline findings, high risk, right eye: Secondary | ICD-10-CM | POA: Diagnosis not present

## 2024-06-11 DIAGNOSIS — H40021 Open angle with borderline findings, high risk, right eye: Secondary | ICD-10-CM | POA: Diagnosis not present

## 2024-07-09 DIAGNOSIS — H40021 Open angle with borderline findings, high risk, right eye: Secondary | ICD-10-CM | POA: Diagnosis not present

## 2024-07-20 ENCOUNTER — Other Ambulatory Visit: Payer: Self-pay | Admitting: Nurse Practitioner

## 2024-07-22 ENCOUNTER — Encounter: Payer: Self-pay | Admitting: Adult Health

## 2024-07-22 ENCOUNTER — Ambulatory Visit
Admission: RE | Admit: 2024-07-22 | Discharge: 2024-07-22 | Disposition: A | Discharge status: Home / Self Care | Source: Ambulatory Visit | Attending: Adult Health | Admitting: Adult Health

## 2024-07-22 ENCOUNTER — Ambulatory Visit (INDEPENDENT_AMBULATORY_CARE_PROVIDER_SITE_OTHER): Admitting: Adult Health

## 2024-07-22 VITALS — BP 128/76 | HR 87 | Temp 98.0°F | Resp 18 | Ht 70.0 in | Wt 202.0 lb

## 2024-07-22 DIAGNOSIS — R059 Cough, unspecified: Secondary | ICD-10-CM | POA: Diagnosis not present

## 2024-07-22 DIAGNOSIS — J309 Allergic rhinitis, unspecified: Secondary | ICD-10-CM

## 2024-07-22 DIAGNOSIS — R41840 Attention and concentration deficit: Secondary | ICD-10-CM

## 2024-07-22 DIAGNOSIS — R051 Acute cough: Secondary | ICD-10-CM | POA: Diagnosis not present

## 2024-07-22 DIAGNOSIS — K219 Gastro-esophageal reflux disease without esophagitis: Secondary | ICD-10-CM

## 2024-07-22 DIAGNOSIS — I10 Essential (primary) hypertension: Secondary | ICD-10-CM

## 2024-07-22 LAB — POC COVID19 BINAXNOW: SARS Coronavirus 2 Ag: NEGATIVE

## 2024-07-22 LAB — POCT INFLUENZA A/B
Influenza A, POC: NEGATIVE
Influenza B, POC: NEGATIVE

## 2024-07-22 MED ORDER — GUAIFENESIN-DM 100-10 MG/5ML PO SYRP: 10.0000 mL | ORAL_SOLUTION | Freq: Four times a day (QID) | ORAL | Status: DC | PRN

## 2024-07-22 MED ORDER — CETIRIZINE HCL 10 MG PO TABS: 10.0000 mg | ORAL_TABLET | Freq: Every day | ORAL | Status: AC

## 2024-07-22 MED ORDER — OMEPRAZOLE 20 MG PO CPDR: 20.0000 mg | DELAYED_RELEASE_CAPSULE | Freq: Every day | ORAL | Status: AC

## 2024-07-22 NOTE — Progress Notes (Signed)
 [facility] clinic  Provider:  Jereld Serum DNP  Code Status:  Full Code  Goals of Care:     03/25/2024    8:45 AM  Advanced Directives  Does Patient Have a Medical Advance Directive? Yes  Type of Estate agent of [place];Living will  Does patient want to make changes to medical advance directive? No - Patient declined  Copy of Healthcare Power of Attorney in Chart? No - copy requested     Chief Complaint  Patient presents with   Nasal Congestion    cough & post nasal drip that won't go away    Discussed the use of AI scribe software for clinical note transcription with the patient, who gave verbal consent to proceed.  HPI: Patient is a 68 y.o. male seen today for an acute visit for cough and post nasal drip that won't go away.  He has experienced a persistent cough for three to four weeks, occasionally producing foamy, whitish phlegm. The cough is triggered by a tickle in his throat when he inhales. The onset of the cough coincided with moving into a new house, which he attributes to dust exposure during packing. He denies fever or shortness of breath.  He takes Zyrtec  10 mg as needed, typically in the morning, and Benadryl at night for drainage, approximately 14 hours after Zyrtec . He also uses Robitussin DM as needed for the cough, taking two teaspoons (10 mL) every six hours PRN, especially before sleep. Despite these medications, the cough persists.  He has a history of hypertension, managed with losartan  50 mg daily, with a current blood pressure of 128/76 mmHg. He also has allergic rhinitis, taking Zyrtec  as needed, and has been using it daily for the past two weeks but still experiences nasal drip.  He has a history of acid reflux, managed with omeprazole  20 mg every other day, which he finds effective. He denies dietary triggers for his cough and does not consume spicy foods. He avoids nasal steroids like Flonase due to glaucoma, affecting his right  eye and causing pressure issues in the left eye.  He has a history of ADD, diagnosed in his fifties, and takes Adderall, though he has not taken it in the past week. He is retired and quit smoking 27 years ago.    Past Medical History:  Diagnosis Date   Abdominal pain, left lower quadrant    Actinic keratosis    ADD (attention deficit disorder)    Alcohol abuse, unspecified    Anxiety    Anxiety state, unspecified    Arthritis    shoulders and hips   Atrial fibrillation (HCC)    Attention deficit disorder without mention of hyperactivity    Cervicalgia    COVID    Depressive disorder, not elsewhere classified    Eczema 11/25/2011   right hand   Edema    Encounter for long-term (current) use of other medications    Glaucoma    Right eye   Headache(784.0)    tension or sinus   High cholesterol    Inguinal hernia 11/25/2011   right   Inguinal hernia without mention of obstruction or gangrene, unilateral or unspecified, (not specified as recurrent)    Insomnia, unspecified    Internal hemorrhoids without mention of complication    Irritable bowel syndrome    Irritable bowel syndrome (IBS)    Lumbago    Major depressive disorder, recurrent episode, severe, without mention of psychotic behavior    Obesity, unspecified  Other and unspecified hyperlipidemia    Other atopic dermatitis and related conditions    Other disorders of vitreous    Other seborrheic keratosis    Other specified visual disturbances    Pain in joint, pelvic region and thigh    Pain in joint, site unspecified    Pathologic fracture of vertebrae    Poisoning and toxic reactions caused by other specified animals and plants    Recent retinal detachment, partial, with single defect    Routine general medical examination at a health care facility    Sciatica    Sinus infection 11/25/2011   started antibiotic 12/18/2011 x 7 days; current cough   Special screening for malignant neoplasm of prostate     Tension headache    Type II or unspecified type diabetes mellitus without mention of complication, not stated as uncontrolled    Unspecified essential hypertension     Past Surgical History:  Procedure Laterality Date   APPENDECTOMY  1987   CATARACT EXTRACTION  2011   right eye   CATARACT EXTRACTION W/ INTRAOCULAR LENS IMPLANT Left 04/04/2017   Dr. Anselmo   EYE SURGERY Right 09/13/2018   Dr.Hanes with [facility]    HEMORRHOID SURGERY  04/15/14   thrombosed int. Hemorrhoid. Dr. Dann Hummer   HERNIA REPAIR  05/03/2011   left   INGUINAL HERNIA REPAIR  12/28/2011   Procedure: HERNIA REPAIR INGUINAL ADULT;  Surgeon: Donnice Bury, MD;  Location: [facility];  Service: General;  Laterality: Right;   NASAL POLYP SURGERY  01/05/2011   DR. [facility]    NASAL SINUS SURGERY  12/2010   removed anal warts  1976   Dr Ivin    RETINAL DETACHMENT REPAIR W/ SCLERAL BUCKLE LE  07/22/2008   right eye; pars plana vitrectomy   TONSILLECTOMY AND ADENOIDECTOMY  1964   TYMPANOSTOMY TUBE PLACEMENT  June 2015   Dr. JAYSON. Newman    Allergies  Allergen Reactions   Amitriptyline    Zolpidem Tartrate Other (See Comments)    Severe headache   Amoxicillin-Pot Clavulanate Rash   Propoxyphene Hcl Nausea Only    Outpatient Encounter Medications as of 07/22/2024  Medication Sig   acetaminophen  (TYLENOL ) 500 MG tablet Take 500 mg by mouth as needed.   amphetamine -dextroamphetamine (ADDERALL XR) 10 MG 24 hr capsule Take one tablet once a day for ADD   amphetamine -dextroamphetamine (ADDERALL XR) 10 MG 24 hr capsule Take one tablet once a day for ADD   amphetamine -dextroamphetamine (ADDERALL XR) 10 MG 24 hr capsule Take one tablet once a day for ADD   aspirin EC 325 MG tablet Take 325 mg by mouth as needed.   clonazePAM  (KLONOPIN ) 0.5 MG tablet Take 1 tablet (0.5 mg total) by mouth daily as needed for anxiety.   dicyclomine  (BENTYL ) 20 MG tablet TAKE 1 TABLET BY MOUTH EVERY 6 HOURS AS NEEDED  FOR PAIN OR IRRITABLE BOWEL SYNDROME   guaiFENesin -dextromethorphan (ROBITUSSIN DM) 100-10 MG/5ML syrup Take 10 mLs by mouth every 6 (six) hours as needed for cough.   halobetasol  (ULTRAVATE ) 0.05 % cream APPLY EXTERNALLY TO rash UP TO THREE TIMES DAILY   latanoprost (XALATAN) 0.005 % ophthalmic solution Place 1 drop into both eyes at bedtime.   losartan  (COZAAR ) 50 MG tablet Take 1 tablet (50 mg total) by mouth daily.   lovastatin  (MEVACOR ) 20 MG tablet TAKE 1 TABLET BY MOUTH NIGHTLY AT BEDTIME   Multiple Vitamin (MULTIVITAMIN) tablet Take 1 tablet by mouth daily.   [DISCONTINUED]  cetirizine  (ZYRTEC ) 10 MG tablet Take 10 mg by mouth as needed.   [DISCONTINUED] omeprazole  (PRILOSEC) 20 MG capsule TAKE ONE CAPSULE BY MOUTH EVERY OTHER DAY   cetirizine  (ZYRTEC ) 10 MG tablet Take 1 tablet (10 mg total) by mouth daily.   omeprazole  (PRILOSEC) 20 MG capsule Take 1 capsule (20 mg total) by mouth daily.   No facility-administered encounter medications on file as of 07/22/2024.    Review of Systems:  Review of Systems  Constitutional:  Negative for activity change, appetite change and fever.  HENT:  Positive for postnasal drip. Negative for sore throat.   Eyes: Negative.   Respiratory:  Positive for cough.   Cardiovascular:  Negative for chest pain and leg swelling.  Gastrointestinal:  Negative for abdominal distention, diarrhea and vomiting.  Genitourinary:  Negative for dysuria, frequency and urgency.  Skin:  Negative for color change.  Neurological:  Negative for dizziness and headaches.  Psychiatric/Behavioral:  Negative for behavioral problems and sleep disturbance. The patient is not nervous/anxious.     Health Maintenance  Topic Date Due   COVID-19 Vaccine (5 - 2024-25 season) 08/26/2023   DTaP/Tdap/Td (3 - Td or Tdap) 10/26/2023   INFLUENZA VACCINE  07/25/2024   Medicare Annual Wellness (AWV)  03/25/2025   Colonoscopy  02/07/2028   Pneumococcal Vaccine: 50+ Years  Completed    Hepatitis C Screening  Completed   Zoster Vaccines- Shingrix  Completed   Hepatitis B Vaccines  Aged Out   HPV VACCINES  Aged Out   Meningococcal B Vaccine  Aged Out    Physical Exam: Vitals:   07/22/24 1350  BP: 128/76  Pulse: 87  Resp: 18  Temp: 98 F (36.7 C)  SpO2: 97%  Weight: 202 lb (91.6 kg)  Height: 5' 10 (1.778 m)   Body mass index is 28.98 kg/m. Physical Exam Constitutional:      General: He is not in acute distress.    Appearance: Normal appearance.  HENT:     Head: Normocephalic and atraumatic.     Mouth/Throat:     Mouth: Mucous membranes are moist.  Eyes:     Conjunctiva/sclera: Conjunctivae normal.  Cardiovascular:     Rate and Rhythm: Normal rate and regular rhythm.     Pulses: Normal pulses.     Heart sounds: Normal heart sounds.  Pulmonary:     Effort: Pulmonary effort is normal.     Breath sounds: Normal breath sounds.  Abdominal:     General: Bowel sounds are normal.     Palpations: Abdomen is soft.  Musculoskeletal:        General: No swelling. Normal range of motion.     Cervical back: Normal range of motion.  Skin:    General: Skin is warm and dry.  Neurological:     General: No focal deficit present.     Mental Status: He is alert and oriented to person, place, and time.  Psychiatric:        Mood and Affect: Mood normal.        Behavior: Behavior normal.        Thought Content: Thought content normal.        Judgment: Judgment normal.     Labs reviewed: Basic Metabolic Panel: Recent Labs    01/08/24 0809 04/15/24 0856  NA 140 137  K 4.5 4.2  CL 100 101  CO2 29 27  GLUCOSE 128* 98  BUN 17 16  CREATININE 0.93 0.88  CALCIUM 9.6 9.6  Liver Function Tests: Recent Labs    01/08/24 0809 04/15/24 0856  AST 53* 23  ALT 61* 20  BILITOT 0.8 0.8  PROT 7.0 7.0   No results for input(s): LIPASE, AMYLASE in the last 8760 hours. No results for input(s): AMMONIA in the last 8760 hours. CBC: Recent Labs     01/08/24 0809  WBC 7.8  NEUTROABS 4,126  HGB 14.2  HCT 42.5  MCV 96.8  PLT 172   Lipid Panel: Recent Labs    01/08/24 0809  CHOL 183  HDL 67  LDLCALC 97  TRIG 99  CHOLHDL 2.7   Lab Results  Component Value Date   HGBA1C 6.0 (H) 01/08/2024    Procedures since last visit: No results found.  Assessment/Plan  1. Cough, unspecified type (Primary) -  Persistent cough with foamy sputum. No fever or dyspnea. Wheezing noted. Negative for influenza and COVID. - Order chest x-ray to rule out pneumonia. - Continue Robitussin DM 10 mL every 6 hours as needed, especially at night for 2 weeks. - Prescribe antibiotics if chest x-ray indicates infection. - POC COVID-19 BinaxNow - negative - POCT Influenza A/B-  negative - DG Chest 2 View - cetirizine  (ZYRTEC ) 10 MG tablet; Take 1 tablet (10 mg total) by mouth daily. - guaiFENesin -dextromethorphan (ROBITUSSIN DM) 100-10 MG/5ML syrup; Take 10 mLs by mouth every 6 (six) hours as needed for cough.  2. Allergic rhinitis, unspecified seasonality, unspecified trigger -  Allergic rhinitis with postnasal drip contributing to cough. Flonase avoided due to glaucoma. - Change Zyrtec  to 10 mg daily.  3. Primary hypertension -  Hypertension well-controlled with losartan . Current BP 128/76 mmHg.  4. Attention or concentration deficit -  continue Adderall XR  5. Gastroesophageal reflux disease without esophagitis -  managed with omeprazole  20 mg Q other day. Cough may be exacerbated by reflux. - Increase omeprazole  to 20 mg daily - omeprazole  (PRILOSEC) 20 MG capsule; Take 1 capsule (20 mg total) by mouth daily.        Labs/tests ordered:   COVID-19 and Influenza A/B   No follow-ups on file.  Refujio Haymer Medina-Vargas, NP

## 2024-07-23 ENCOUNTER — Encounter: Payer: Self-pay | Admitting: Adult Health

## 2024-07-28 ENCOUNTER — Other Ambulatory Visit: Payer: Self-pay | Admitting: *Deleted

## 2024-07-28 ENCOUNTER — Other Ambulatory Visit: Payer: Self-pay | Admitting: Nurse Practitioner

## 2024-07-28 DIAGNOSIS — F902 Attention-deficit hyperactivity disorder, combined type: Secondary | ICD-10-CM

## 2024-07-28 DIAGNOSIS — K580 Irritable bowel syndrome with diarrhea: Secondary | ICD-10-CM

## 2024-07-28 MED ORDER — DICYCLOMINE HCL 20 MG PO TABS: ORAL_TABLET | ORAL | 3 refills | Status: AC

## 2024-07-28 NOTE — Telephone Encounter (Signed)
 Patient is requesting a refill of the following medications: Requested Prescriptions   Pending Prescriptions Disp Refills   amphetamine -dextroamphetamine (ADDERALL XR) 10 MG 24 hr capsule [Pharmacy Med Name: dextroamphetamine-amphetamine  ER 10 mg 24hr capsule,extend release] 30 capsule 0    Sig: TAKE 1 CAPSULE BY MOUTH ONCE DAILY add    Date of last refill:04/21/2024  Refill amount: 30/0  Treatment agreement date: 04/18/2024

## 2024-07-28 NOTE — Telephone Encounter (Signed)
Pharmacy requested refill.  Pended and sent to Jessica for approval.  

## 2024-07-29 ENCOUNTER — Ambulatory Visit: Payer: Self-pay | Admitting: Adult Health

## 2024-07-29 NOTE — Progress Notes (Signed)
-    chest x-ray negative for pneumonia

## 2024-07-29 NOTE — Progress Notes (Signed)
-   3 chest x-ray negative for pneumonia.

## 2024-08-06 DIAGNOSIS — Z85828 Personal history of other malignant neoplasm of skin: Secondary | ICD-10-CM | POA: Diagnosis not present

## 2024-08-06 DIAGNOSIS — L820 Inflamed seborrheic keratosis: Secondary | ICD-10-CM | POA: Diagnosis not present

## 2024-08-06 DIAGNOSIS — D225 Melanocytic nevi of trunk: Secondary | ICD-10-CM | POA: Diagnosis not present

## 2024-08-06 DIAGNOSIS — Z08 Encounter for follow-up examination after completed treatment for malignant neoplasm: Secondary | ICD-10-CM | POA: Diagnosis not present

## 2024-08-06 DIAGNOSIS — Z1283 Encounter for screening for malignant neoplasm of skin: Secondary | ICD-10-CM | POA: Diagnosis not present

## 2024-08-07 DIAGNOSIS — H40021 Open angle with borderline findings, high risk, right eye: Secondary | ICD-10-CM | POA: Diagnosis not present

## 2024-09-09 ENCOUNTER — Other Ambulatory Visit: Payer: Self-pay | Admitting: Nurse Practitioner

## 2024-09-09 ENCOUNTER — Ambulatory Visit: Payer: Self-pay

## 2024-09-09 DIAGNOSIS — F902 Attention-deficit hyperactivity disorder, combined type: Secondary | ICD-10-CM

## 2024-09-09 MED ORDER — AMPHETAMINE-DEXTROAMPHET ER 10 MG PO CP24
ORAL_CAPSULE | ORAL | 0 refills | Status: DC
Start: 2024-09-09 — End: 2024-10-09

## 2024-09-09 NOTE — Telephone Encounter (Signed)
 FYI Only or Action Required?: Action required by provider: medication refill request.  Patient was last seen in primary care on 07/22/2024 by Medina-Vargas, Monina C, NP.  Called Nurse Triage reporting Medication Refill.  No symptoms-patient is completely out. Needing medication filled.   Triage Disposition: Call PCP When Office is Open  Patient/caregiver understands and will follow disposition?: Yes  Copied from CRM [id number]. Topic: Clinical - Red Word Triage >> Sep 09, 2024  8:35 AM Miquel SAILOR wrote: Red Word that prompted transfer to Nurse Triage: amphetamine -dextroamphetamine (ADDERALL XR) 10 MG 24 hr capsule-Refill medication out of this Reason for Disposition  Caller requesting a CONTROLLED substance prescription refill (e.g., narcotics, ADHD medicines)  Answer Assessment - Initial Assessment Questions 1. DRUG NAME: What medicine do you need to have refilled?     Adderall XR 2. REFILLS REMAINING: How many refills are remaining? Notes: The label on the medicine or pill bottle will show how many refills are remaining. If there are no refills remaining, then a renewal may be needed.     0 3. EXPIRATION DATE: What is the expiration date? Note: The label states when the prescription will expire, and thus can no longer be refilled.)     01/24/2025 4. PRESCRIBER: Who prescribed it? Note: The prescribing doctor or group is responsible for refill approvals.SABRA Harlene An 5. PHARMACY: Have you contacted your pharmacy (drugstore)? Note: Some pharmacies will contact the doctor (or NP/PA).      no 6. SYMPTOMS: Do you have any symptoms?     No   Patient moved to [facility] and now using a different pharmacy. Patient is scheduled for an appointment to see PCP at the end of October. Patient is completely out of medication  Protocols used: Medication Refill and Renewal Call-A-AH

## 2024-09-09 NOTE — Telephone Encounter (Signed)
 Copied from CRM [id number]. Topic: Clinical - Medication Refill >> Sep 09, 2024  8:33 AM Miquel SAILOR wrote: Medication: amphetamine -dextroamphetamine (ADDERALL XR) 10 MG 24 hr capsule   Has the patient contacted their pharmacy? Yes (Agent: If no, request that the patient contact the pharmacy for the refill. If patient does not wish to contact the pharmacy document the reason why and proceed with request.) (Agent: If yes, when and what did the pharmacy advise?)  This is the patient's preferred pharmacy:  [id number] - [place], [place] - [address] AT [facility] [address] [place] [address] Phone: [phone] Fax: [phone]  Is this the correct pharmacy for this prescription? Yes If no, delete pharmacy and type the correct one.   Has the prescription been filled recently? Yes  Is the patient out of the medication? Yes  Has the patient been seen for an appointment in the last year OR does the patient have an upcoming appointment? Yes  Can we respond through MyChart? Yes  Agent: Please be advised that Rx refills may take up to 3 business days. We ask that you follow-up with your pharmacy.

## 2024-09-09 NOTE — Telephone Encounter (Signed)
 Controlled substance. No contact on file,updated patients appt notes regarding updating contract.

## 2024-09-17 ENCOUNTER — Other Ambulatory Visit: Payer: Self-pay | Admitting: Nurse Practitioner

## 2024-09-17 DIAGNOSIS — F419 Anxiety disorder, unspecified: Secondary | ICD-10-CM

## 2024-09-17 NOTE — Telephone Encounter (Signed)
 Patient has request for refill on Clonazepam . Patient last refill was 03/25/2024. Patient has contract on file dated 04/24/2024. Medication pend and sent to PCP Arch, Jessica K, NP) for approval.

## 2024-10-09 ENCOUNTER — Other Ambulatory Visit: Payer: Self-pay | Admitting: Nurse Practitioner

## 2024-10-09 DIAGNOSIS — F902 Attention-deficit hyperactivity disorder, combined type: Secondary | ICD-10-CM

## 2024-10-09 MED ORDER — AMPHETAMINE-DEXTROAMPHET ER 10 MG PO CP24: ORAL_CAPSULE | ORAL | 0 refills | Status: DC

## 2024-10-09 NOTE — Telephone Encounter (Signed)
 Patient has request for refill on Adderall. Patient last refill was 09/09/2024. Patient has contract on file dated 04/24/2024. Medication pend and sent to PCP Arch, Jessica K, NP) for approval.

## 2024-10-09 NOTE — Telephone Encounter (Signed)
 Copied from CRM [id number]. Topic: Clinical - Medication Refill >> Oct 09, 2024 11:05 AM Vivian Z wrote: Medication: amphetamine -dextroamphetamine (ADDERALL XR) 10 MG 24 hr capsule  Has the patient contacted their pharmacy? Yes (Agent: If no, request that the patient contact the pharmacy for the refill. If patient does not wish to contact the pharmacy document the reason why and proceed with request.) (Agent: If yes, when and what did the pharmacy advise?)  This is the patient's preferred pharmacy:  [id number] - [place], [place] - [address] AT [facility] [address] [place] [address] Phone: [phone] Fax: [phone]  Is this the correct pharmacy for this prescription? Yes If no, delete pharmacy and type the correct one.   Has the prescription been filled recently? No  Is the patient out of the medication? Yes  Has the patient been seen for an appointment in the last year OR does the patient have an upcoming appointment? Yes  Can we respond through MyChart? Yes  Agent: Please be advised that Rx refills may take up to 3 business days. We ask that you follow-up with your pharmacy.

## 2024-10-11 ENCOUNTER — Encounter: Payer: Self-pay | Admitting: Nurse Practitioner

## 2024-10-13 ENCOUNTER — Other Ambulatory Visit: Payer: Self-pay | Admitting: Nurse Practitioner

## 2024-10-13 ENCOUNTER — Other Ambulatory Visit

## 2024-10-13 DIAGNOSIS — E782 Mixed hyperlipidemia: Secondary | ICD-10-CM | POA: Diagnosis not present

## 2024-10-13 DIAGNOSIS — R972 Elevated prostate specific antigen [PSA]: Secondary | ICD-10-CM

## 2024-10-13 DIAGNOSIS — R739 Hyperglycemia, unspecified: Secondary | ICD-10-CM

## 2024-10-13 DIAGNOSIS — I10 Essential (primary) hypertension: Secondary | ICD-10-CM | POA: Diagnosis not present

## 2024-10-13 LAB — COMPREHENSIVE METABOLIC PANEL WITH GFR
AG Ratio: 2.2 (calc) (ref 1.0–2.5)
ALT: 31 U/L (ref 9–46)
AST: 24 U/L (ref 10–35)
Albumin: 4.4 g/dL (ref 3.6–5.1)
Alkaline phosphatase (APISO): 66 U/L (ref 35–144)
BUN: 16 mg/dL (ref 7–25)
CO2: 26 mmol/L (ref 20–32)
Calcium: 8.9 mg/dL (ref 8.6–10.3)
Chloride: 103 mmol/L (ref 98–110)
Creat: 0.95 mg/dL (ref 0.70–1.35)
Globulin: 2 g/dL (ref 1.9–3.7)
Glucose, Bld: 117 mg/dL — ABNORMAL HIGH (ref 65–99)
Potassium: 4.2 mmol/L (ref 3.5–5.3)
Sodium: 140 mmol/L (ref 135–146)
Total Bilirubin: 0.3 mg/dL (ref 0.2–1.2)
Total Protein: 6.4 g/dL (ref 6.1–8.1)
eGFR: 88 mL/min/1.73m2 (ref >=60)

## 2024-10-13 LAB — CBC WITH DIFFERENTIAL/PLATELET
Absolute Lymphocytes: 2196 {cells}/uL (ref 850–3900)
Absolute Monocytes: 711 {cells}/uL (ref 200–950)
Basophils Absolute: 40 {cells}/uL (ref 0–200)
Basophils Relative: 0.5 %
Eosinophils Absolute: 585 {cells}/uL — ABNORMAL HIGH (ref 15–500)
Eosinophils Relative: 7.4 %
HCT: 41.4 % (ref 38.5–50.0)
Hemoglobin: 13.7 g/dL (ref 13.2–17.1)
MCH: 32.4 pg (ref 27.0–33.0)
MCHC: 33.1 g/dL (ref 32.0–36.0)
MCV: 97.9 fL (ref 80.0–100.0)
MPV: 10.2 fL (ref 7.5–12.5)
Monocytes Relative: 9 %
Neutro Abs: 4369 {cells}/uL (ref 1500–7800)
Neutrophils Relative %: 55.3 %
Platelets: 184 Thousand/uL (ref 140–400)
RBC: 4.23 Million/uL (ref 4.20–5.80)
RDW: 12.1 % (ref 11.0–15.0)
Total Lymphocyte: 27.8 %
WBC: 7.9 Thousand/uL (ref 3.8–10.8)

## 2024-10-13 LAB — HEMOGLOBIN A1C
Hgb A1c MFr Bld: 5.6 % (ref <5.7)
Mean Plasma Glucose: 114 mg/dL
eAG (mmol/L): 6.3 mmol/L

## 2024-10-13 LAB — PSA: PSA: 4.76 ng/mL — ABNORMAL HIGH (ref <=4.00)

## 2024-10-13 LAB — LIPID PANEL
Cholesterol: 165 mg/dL (ref <200)
HDL: 61 mg/dL (ref >=40)
LDL Cholesterol (Calc): 85 mg/dL
Non-HDL Cholesterol (Calc): 104 mg/dL (ref <130)
Total CHOL/HDL Ratio: 2.7 (calc) (ref <5.0)
Triglycerides: 94 mg/dL (ref <150)

## 2024-10-14 ENCOUNTER — Ambulatory Visit: Payer: Self-pay | Admitting: Nurse Practitioner

## 2024-10-16 ENCOUNTER — Other Ambulatory Visit: Payer: Self-pay

## 2024-10-16 DIAGNOSIS — E785 Hyperlipidemia, unspecified: Secondary | ICD-10-CM

## 2024-10-16 DIAGNOSIS — I10 Essential (primary) hypertension: Secondary | ICD-10-CM

## 2024-10-16 MED ORDER — LOSARTAN POTASSIUM 50 MG PO TABS: 50.0000 mg | ORAL_TABLET | Freq: Every day | ORAL | 3 refills | Status: AC

## 2024-10-16 MED ORDER — LOVASTATIN 20 MG PO TABS: ORAL_TABLET | ORAL | 3 refills | Status: AC

## 2024-10-16 NOTE — Telephone Encounter (Signed)
 Refilled patients lovastatin  20mg  due to patients pharmacy sending fax rx request over.

## 2024-10-20 ENCOUNTER — Ambulatory Visit: Admitting: Nurse Practitioner

## 2024-10-20 ENCOUNTER — Encounter: Payer: Self-pay | Admitting: Nurse Practitioner

## 2024-10-20 ENCOUNTER — Ambulatory Visit: Attending: Nurse Practitioner

## 2024-10-20 VITALS — BP 130/86 | HR 87 | Temp 97.8°F | Ht 70.0 in | Wt 202.2 lb

## 2024-10-20 DIAGNOSIS — E785 Hyperlipidemia, unspecified: Secondary | ICD-10-CM | POA: Diagnosis not present

## 2024-10-20 DIAGNOSIS — J019 Acute sinusitis, unspecified: Secondary | ICD-10-CM

## 2024-10-20 DIAGNOSIS — R972 Elevated prostate specific antigen [PSA]: Secondary | ICD-10-CM

## 2024-10-20 DIAGNOSIS — F419 Anxiety disorder, unspecified: Secondary | ICD-10-CM | POA: Diagnosis not present

## 2024-10-20 DIAGNOSIS — R002 Palpitations: Secondary | ICD-10-CM

## 2024-10-20 DIAGNOSIS — K580 Irritable bowel syndrome with diarrhea: Secondary | ICD-10-CM

## 2024-10-20 DIAGNOSIS — R739 Hyperglycemia, unspecified: Secondary | ICD-10-CM

## 2024-10-20 DIAGNOSIS — F902 Attention-deficit hyperactivity disorder, combined type: Secondary | ICD-10-CM

## 2024-10-20 DIAGNOSIS — M170 Bilateral primary osteoarthritis of knee: Secondary | ICD-10-CM

## 2024-10-20 DIAGNOSIS — I10 Essential (primary) hypertension: Secondary | ICD-10-CM | POA: Diagnosis not present

## 2024-10-20 MED ORDER — CLONAZEPAM 0.5 MG PO TABS
0.5000 mg | ORAL_TABLET | Freq: Every day | ORAL | 5 refills | Status: AC | PRN
Start: 2024-10-20 — End: ?

## 2024-10-20 MED ORDER — PREDNISONE 10 MG (21) PO TBPK: ORAL_TABLET | ORAL | 0 refills | Status: AC

## 2024-10-20 NOTE — Progress Notes (Signed)
136

## 2024-10-20 NOTE — Progress Notes (Unsigned)
 EP to read.

## 2024-10-20 NOTE — Progress Notes (Signed)
 Careteam: Patient Care Team: Caro Harlene POUR, NP as PCP - General (Geriatric Medicine) Ethyl Lonni BRAVO, MD (Inactive) as Consulting Physician (Otolaryngology) Sebastian Moles, MD as Consulting Physician (General Surgery) Aneita Gwendlyn DASEN, MD (Inactive) as Consulting Physician (Gastroenterology) Ebbie Cough, MD as Consulting Physician (General Surgery) Shona Rush, MD (Dermatology) Towana Charleston, MD (Gastroenterology)  PLACE OF SERVICE:  [facility]  Advanced Directive information    Allergies  Allergen Reactions   Amitriptyline    Zolpidem Tartrate Other (See Comments)    Severe headache   Amoxicillin-Pot Clavulanate Rash   Propoxyphene Hcl Nausea Only    Chief Complaint  Patient presents with   Medical Management of Chronic Issues    6 month routine follow up  Patient stated he's having pain in left knee , patient stated when he sits or stands it sounds like  rice krispies   Sinus infection - hurts on the right side and having mucus green  Patient stated he was wondering if he should get the COVID vaccine will discuss with pcp    HPI:  Discussed the use of AI scribe software for clinical note transcription with the patient, who gave verbal consent to proceed.  History of Present Illness Connor [name] is a 68 year old male who presents for a routine follow-up visit.  He has hx of mildly elevate TSH which has been stable. No changes in urinary frequency or flow are reported, despite a slightly elevated PSA level. He has not been to a urologist and reports stable urinary symptoms.  He experiences increased anxiety and difficulty sleeping, for which he takes Klonopin  as needed.  He describes pain in his left knee, noting it 'sounds like Rice Krispies' when sitting or standing, with occasional instability and pain when using stairs. He uses Voltaren gel occasionally for relief.  He reports sinus pain and discomfort on the right side, with green or  bloody discharge following nasal rinses. He experiences sneezing fits and epistaxis, symptoms have been going on for about six days.  He has a history of palpitations since his twenties, with variable frequency and duration, described as feeling like his heart 'skips'.  He mentions a recent wrist injury sustained while working on a tractor, resulting in swelling and a knot, but no significant pain or functional limitation.    Review of Systems:  Review of Systems  Constitutional:  Negative for chills, fever and weight loss.  HENT:  Positive for congestion. Negative for sore throat and tinnitus.   Respiratory:  Negative for cough, sputum production and shortness of breath.   Cardiovascular:  Positive for palpitations. Negative for chest pain and leg swelling.  Gastrointestinal:  Negative for abdominal pain, constipation, diarrhea and heartburn.  Genitourinary:  Negative for dysuria, flank pain, frequency, hematuria and urgency.  Musculoskeletal:  Positive for joint pain. Negative for back pain, falls and myalgias.  Skin: Negative.   Neurological:  Negative for dizziness and headaches.  Psychiatric/Behavioral:  Negative for depression and memory loss. The patient is nervous/anxious. The patient does not have insomnia.     Past Medical History:  Diagnosis Date   Abdominal pain, left lower quadrant    Actinic keratosis    ADD (attention deficit disorder)    Alcohol abuse, unspecified    Anxiety    Anxiety state, unspecified    Arthritis    shoulders and hips   Atrial fibrillation (HCC)    Attention deficit disorder without mention of hyperactivity    Cervicalgia  COVID    Depressive disorder, not elsewhere classified    Eczema 11/25/2011   right hand   Edema    Encounter for long-term (current) use of other medications    Glaucoma    Right eye   Headache(784.0)    tension or sinus   High cholesterol    Inguinal hernia 11/25/2011   right   Inguinal hernia without mention  of obstruction or gangrene, unilateral or unspecified, (not specified as recurrent)    Insomnia, unspecified    Internal hemorrhoids without mention of complication    Irritable bowel syndrome    Irritable bowel syndrome (IBS)    Lumbago    Major depressive disorder, recurrent episode, severe, without mention of psychotic behavior    Obesity, unspecified    Other and unspecified hyperlipidemia    Other atopic dermatitis and related conditions    Other disorders of vitreous    Other seborrheic keratosis    Other specified visual disturbances    Pain in joint, pelvic region and thigh    Pain in joint, site unspecified    Pathologic fracture of vertebrae    Poisoning and toxic reactions caused by other specified animals and plants    Recent retinal detachment, partial, with single defect    Routine general medical examination at a health care facility    Sciatica    Sinus infection 11/25/2011   started antibiotic 12/18/2011 x 7 days; current cough   Special screening for malignant neoplasm of prostate    Tension headache    Type II or unspecified type diabetes mellitus without mention of complication, not stated as uncontrolled    Unspecified essential hypertension    Past Surgical History:  Procedure Laterality Date   APPENDECTOMY  1987   CATARACT EXTRACTION  2011   right eye   CATARACT EXTRACTION W/ INTRAOCULAR LENS IMPLANT Left 04/04/2017   Dr. Anselmo   EYE SURGERY Right 09/13/2018   Dr.Hanes with [facility]    HEMORRHOID SURGERY  04/15/14   thrombosed int. Hemorrhoid. Dr. Dann Hummer   HERNIA REPAIR  05/03/2011   left   INGUINAL HERNIA REPAIR  12/28/2011   Procedure: HERNIA REPAIR INGUINAL ADULT;  Surgeon: Donnice Bury, MD;  Location: [facility];  Service: General;  Laterality: Right;   NASAL POLYP SURGERY  01/05/2011   DR. [facility]    NASAL SINUS SURGERY  12/2010   removed anal warts  1976   Dr Ivin    RETINAL DETACHMENT REPAIR W/ SCLERAL BUCKLE  LE  07/22/2008   right eye; pars plana vitrectomy   TONSILLECTOMY AND ADENOIDECTOMY  1964   TYMPANOSTOMY TUBE PLACEMENT  June 2015   Dr. JAYSON. Newman   Social History:   reports that he quit smoking about 27 years ago. His smoking use included cigarettes. He has never used smokeless tobacco. He reports that he does not currently use alcohol. He reports that he does not use drugs.  Family History  Problem Relation Age of Onset   Stroke Father    Cancer Brother        prostate   Cancer Paternal Grandfather        colon    Medications: Patient's Medications  New Prescriptions   No medications on file  Previous Medications   ACETAMINOPHEN  (TYLENOL ) 500 MG TABLET    Take 500 mg by mouth as needed.   AMPHETAMINE -DEXTROAMPHETAMINE (ADDERALL XR) 10 MG 24 HR CAPSULE    TAKE 1 CAPSULE BY MOUTH ONCE DAILY add  ASPIRIN EC 325 MG TABLET    Take 325 mg by mouth as needed.   BRIMONIDINE-TIMOLOL (COMBIGAN) 0.2-0.5 % OPHTHALMIC SOLUTION    Place 1 drop into the right eye in the morning and at bedtime.   CETIRIZINE  (ZYRTEC ) 10 MG TABLET    Take 1 tablet (10 mg total) by mouth daily.   CLONAZEPAM  (KLONOPIN ) 0.5 MG TABLET    TAKE ONE TABLET BY MOUTH ONCE DAILY AS NEEDED FOR ANXIETY   DICYCLOMINE  (BENTYL ) 20 MG TABLET    TAKE 1 TABLET BY MOUTH EVERY 6 HOURS AS NEEDED FOR PAIN OR IRRITABLE BOWEL SYNDROME   HALOBETASOL  (ULTRAVATE ) 0.05 % CREAM    APPLY EXTERNALLY TO rash UP TO THREE TIMES DAILY   LOSARTAN  (COZAAR ) 50 MG TABLET    Take 1 tablet (50 mg total) by mouth daily.   LOVASTATIN  (MEVACOR ) 20 MG TABLET    TAKE 1 TABLET BY MOUTH NIGHTLY AT BEDTIME   LUMIGAN 0.01 % SOLN    Place 1 drop into both eyes at bedtime.   MULTIPLE VITAMIN (MULTIVITAMIN) TABLET    Take 1 tablet by mouth daily.   OMEPRAZOLE  (PRILOSEC) 20 MG CAPSULE    Take 1 capsule (20 mg total) by mouth daily.  Modified Medications   No medications on file  Discontinued Medications   GUAIFENESIN -DEXTROMETHORPHAN (ROBITUSSIN DM) 100-10  MG/5ML SYRUP    Take 10 mLs by mouth every 6 (six) hours as needed for cough.   LATANOPROST (XALATAN) 0.005 % OPHTHALMIC SOLUTION    Place 1 drop into both eyes at bedtime.    Physical Exam:  Vitals:   10/20/24 0831 10/20/24 0843  BP: (!) 136/92 130/86  Pulse: 87   Temp: 97.8 F (36.6 C)   SpO2: 99%   Weight: 202 lb 3.2 oz (91.7 kg)   Height: 5' 10 (1.778 m)    Body mass index is 29.01 kg/m. Wt Readings from Last 3 Encounters:  10/20/24 202 lb 3.2 oz (91.7 kg)  07/22/24 202 lb (91.6 kg)  04/18/24 197 lb 12.8 oz (89.7 kg)    Physical Exam Constitutional:      General: He is not in acute distress.    Appearance: He is well-developed. He is not diaphoretic.  HENT:     Head: Normocephalic and atraumatic.     Right Ear: External ear normal.     Left Ear: External ear normal.     Mouth/Throat:     Pharynx: No oropharyngeal exudate.  Eyes:     Conjunctiva/sclera: Conjunctivae normal.     Pupils: Pupils are equal, round, and reactive to light.  Cardiovascular:     Rate and Rhythm: Normal rate and regular rhythm.     Heart sounds: Normal heart sounds.  Pulmonary:     Effort: Pulmonary effort is normal.     Breath sounds: Normal breath sounds.  Abdominal:     General: Bowel sounds are normal.     Palpations: Abdomen is soft.  Musculoskeletal:        General: Signs of injury (noted to right wrist, normal ROM, bruising noted) present.     Cervical back: Normal range of motion and neck supple.     Right lower leg: No edema.     Left lower leg: No edema.  Skin:    General: Skin is warm and dry.  Neurological:     Mental Status: He is alert and oriented to person, place, and time.     Labs reviewed: Basic Metabolic Panel: Recent Labs  01/08/24 0809 04/15/24 0856 10/13/24 0821  NA 140 137 140  K 4.5 4.2 4.2  CL 100 101 103  CO2 29 27 26   GLUCOSE 128* 98 117*  BUN 17 16 16   CREATININE 0.93 0.88 0.95  CALCIUM 9.6 9.6 8.9   Liver Function Tests: Recent Labs     01/08/24 0809 04/15/24 0856 10/13/24 0821  AST 53* 23 24  ALT 61* 20 31  BILITOT 0.8 0.8 0.3  PROT 7.0 7.0 6.4   No results for input(s): LIPASE, AMYLASE in the last 8760 hours. No results for input(s): AMMONIA in the last 8760 hours. CBC: Recent Labs    01/08/24 0809 10/13/24 0821  WBC 7.8 7.9  NEUTROABS 4,126 4,369  HGB 14.2 13.7  HCT 42.5 41.4  MCV 96.8 97.9  PLT 172 184   Lipid Panel: Recent Labs    01/08/24 0809 10/13/24 0821  CHOL 183 165  HDL 67 61  LDLCALC 97 85  TRIG 99 94  CHOLHDL 2.7 2.7   TSH: No results for input(s): TSH in the last 8760 hours. A1C: Lab Results  Component Value Date   HGBA1C 5.6 10/13/2024     Assessment/Plan Palpitation Assessment & Plan: Has been a long standing problem but has not had recent monitoring, will order event monitor.   Orders: -     LONG TERM MONITOR (3-14 DAYS); Future  Anxiety Assessment & Plan: Increased anxiety and difficulty sleeping, possibly related to wife's health issues. - Cancel Klonopin  prescription at [facility] and resend [facility] which is now his local pharmacy  - Use Klonopin  daily as needed for anxiety.  Orders: -     clonazePAM ; Take 1 tablet (0.5 mg total) by mouth daily as needed for anxiety.  Dispense: 30 tablet; Refill: 5  Benign essential HTN Assessment & Plan: Blood pressure well-controlled on current regimen. - Continue current antihypertensive regimen.   Orders: -     CBC with Differential/Platelet; Future -     Comprehensive metabolic panel with GFR; Future  Hyperlipidemia, unspecified hyperlipidemia type Assessment & Plan: Cholesterol levels at goal with current treatment. - Continue current treatment with lovastatin .  Orders: -     Comprehensive metabolic panel with GFR; Future  Acute sinusitis, recurrence not specified, unspecified location  Right-sided sinus pain with green discharge and occasional epistaxis for six days, likely  viral. - Continue nasal saline rinses to clear sinuses. - Report if symptoms worsen or fever develops. -     predniSONE; Use as directed  Dispense: 21 tablet; Refill: 0  Elevated PSA Assessment & Plan: PSA slightly elevated but stable over the past year. No current urinary symptoms reported. - Monitor PSA levels and recheck in six months. - Report any new urinary symptoms.  Orders: -     PSA; Future  Hyperglycemia Assessment & Plan: Mildly elevated blood sugar with A1c at 5.6, indicating good control. - Continue monitoring blood sugar levels and A1c.  Orders: -     Hemoglobin A1c; Future  Attention deficit hyperactivity disorder (ADHD), combined type Assessment & Plan: Controlled on current regimen.    Irritable bowel syndrome with diarrhea Assessment & Plan: Stable at this time, continues on bentyl  PRN   Primary osteoarthritis of both knees Assessment & Plan: Chronic bilateral knee pain with crepitus, exacerbated by movement, with occasional instability. - Encourage use of Voltaren gel up to four times daily. - Consider Tylenol  for additional pain relief. - Recommend exercises to strengthen quadriceps and support  knees. - Discuss potential for physical therapy referral if needed in the future.  Left wrist contusion/hematoma Left wrist contusion with swelling and hematoma, likely due to recent trauma. No pain on movement. - Monitor for changes or development of pain.   Return in about 6 months (around 04/20/2025) for routine follow up, labs prior to visit.  Jackilyn Umphlett K. Caro BODILY [facility] [phone]

## 2024-10-25 DIAGNOSIS — R972 Elevated prostate specific antigen [PSA]: Secondary | ICD-10-CM | POA: Insufficient documentation

## 2024-10-25 DIAGNOSIS — M170 Bilateral primary osteoarthritis of knee: Secondary | ICD-10-CM | POA: Insufficient documentation

## 2024-10-25 DIAGNOSIS — F419 Anxiety disorder, unspecified: Secondary | ICD-10-CM | POA: Insufficient documentation

## 2024-10-25 NOTE — Assessment & Plan Note (Signed)
 Mildly elevated blood sugar with A1c at 5.6, indicating good control. - Continue monitoring blood sugar levels and A1c.

## 2024-10-25 NOTE — Assessment & Plan Note (Signed)
 Chronic bilateral knee pain with crepitus, exacerbated by movement, with occasional instability. - Encourage use of Voltaren gel up to four times daily. - Consider Tylenol  for additional pain relief. - Recommend exercises to strengthen quadriceps and support knees. - Discuss potential for physical therapy referral if needed in the future.

## 2024-10-25 NOTE — Assessment & Plan Note (Signed)
 Blood pressure well-controlled on current regimen.  - Continue current antihypertensive regimen.

## 2024-10-25 NOTE — Assessment & Plan Note (Signed)
 Has been a long standing problem but has not had recent monitoring, will order event monitor.

## 2024-10-25 NOTE — Assessment & Plan Note (Signed)
 Cholesterol levels at goal with current treatment. - Continue current treatment with lovastatin .

## 2024-10-25 NOTE — Assessment & Plan Note (Signed)
 PSA slightly elevated but stable over the past year. No current urinary symptoms reported. - Monitor PSA levels and recheck in six months. - Report any new urinary symptoms.

## 2024-10-25 NOTE — Assessment & Plan Note (Signed)
 Controlled on current regimen

## 2024-10-25 NOTE — Assessment & Plan Note (Signed)
 Increased anxiety and difficulty sleeping, possibly related to wife's health issues. - Cancel Klonopin  prescription at [facility] and [facility] which is now his local pharmacy  - Use Klonopin  daily as needed for anxiety.

## 2024-10-25 NOTE — Assessment & Plan Note (Signed)
 Stable at this time, continues on bentyl  PRN

## 2024-11-12 ENCOUNTER — Encounter: Payer: Self-pay | Admitting: Nurse Practitioner

## 2024-11-12 DIAGNOSIS — F902 Attention-deficit hyperactivity disorder, combined type: Secondary | ICD-10-CM

## 2024-11-12 MED ORDER — AMPHETAMINE-DEXTROAMPHET ER 10 MG PO CP24: ORAL_CAPSULE | ORAL | 0 refills | Status: DC

## 2024-11-12 NOTE — Telephone Encounter (Signed)
 Patient is requesting a refill of the following medications: Requested Prescriptions   Pending Prescriptions Disp Refills   amphetamine -dextroamphetamine (ADDERALL XR) 10 MG 24 hr capsule 30 capsule 0    Sig: TAKE 1 CAPSULE BY MOUTH ONCE DAILY add    Date of last refill:10/09/24  Refill amount: 30/0  Treatment agreement date: 04/18/24

## 2024-12-22 ENCOUNTER — Encounter: Payer: Self-pay | Admitting: Nurse Practitioner

## 2024-12-22 DIAGNOSIS — F902 Attention-deficit hyperactivity disorder, combined type: Secondary | ICD-10-CM

## 2024-12-22 MED ORDER — AMPHETAMINE-DEXTROAMPHET ER 10 MG PO CP24: ORAL_CAPSULE | ORAL | 0 refills | Status: DC

## 2024-12-22 NOTE — Telephone Encounter (Signed)
 Patient is requesting a refill of the following medications: Requested Prescriptions   Pending Prescriptions Disp Refills   amphetamine -dextroamphetamine (ADDERALL XR) 10 MG 24 hr capsule 30 capsule 0    Sig: TAKE 1 CAPSULE BY MOUTH ONCE DAILY add    Date of last refill:11/12/24  Refill amount: 30  Treatment agreement date: April, 2025

## 2025-01-29 ENCOUNTER — Encounter: Payer: Self-pay | Admitting: Nurse Practitioner

## 2025-01-29 DIAGNOSIS — F902 Attention-deficit hyperactivity disorder, combined type: Secondary | ICD-10-CM

## 2025-01-29 MED ORDER — AMPHETAMINE-DEXTROAMPHET ER 10 MG PO CP24: ORAL_CAPSULE | ORAL | 0 refills | Status: AC

## 2025-01-29 MED ORDER — AMPHETAMINE-DEXTROAMPHET ER 10 MG PO CP24: 10.0000 mg | ORAL_CAPSULE | Freq: Every day | ORAL | 0 refills | Status: AC

## 2025-01-29 NOTE — Telephone Encounter (Signed)
 Patient is requesting a refill of the following medications: Requested Prescriptions   Pending Prescriptions Disp Refills   amphetamine -dextroamphetamine (ADDERALL XR) 10 MG 24 hr capsule 30 capsule 0    Sig: TAKE 1 CAPSULE BY MOUTH ONCE DAILY add    Date of last refill: 12/22/24  Refill amount: 30  Treatment agreement date: April 2025   2 rx's pending with different start dates so that patient can skip a month of requesting medication (RX will already be at the pharmacy)

## 2025-03-26 ENCOUNTER — Ambulatory Visit: Payer: Self-pay | Admitting: Nurse Practitioner

## 2025-04-15 ENCOUNTER — Other Ambulatory Visit

## 2025-04-16 ENCOUNTER — Other Ambulatory Visit: Payer: Self-pay

## 2025-04-20 ENCOUNTER — Ambulatory Visit: Admitting: Nurse Practitioner
